# Patient Record
Sex: Female | Born: 1940 | Race: Black or African American | Hispanic: No | Smoking: Never smoker
Health system: Southern US, Community
[De-identification: ages and names within clinical notes are randomized; demographics above are authoritative.]

## PROBLEM LIST (undated history)

## (undated) DIAGNOSIS — E7800 Pure hypercholesterolemia, unspecified: Secondary | ICD-10-CM

## (undated) DIAGNOSIS — E785 Hyperlipidemia, unspecified: Secondary | ICD-10-CM

## (undated) DIAGNOSIS — I10 Essential (primary) hypertension: Secondary | ICD-10-CM

## (undated) DIAGNOSIS — E119 Type 2 diabetes mellitus without complications: Secondary | ICD-10-CM

## (undated) HISTORY — DX: Hyperlipidemia, unspecified: E78.5

## (undated) HISTORY — PX: APPENDECTOMY: SHX54

## (undated) HISTORY — PX: EYE SURGERY: SHX253

---

## 2004-04-21 ENCOUNTER — Ambulatory Visit: Payer: Self-pay | Admitting: Occupational Therapy

## 2004-07-17 ENCOUNTER — Emergency Department: Payer: Self-pay | Admitting: General Practice

## 2005-07-31 ENCOUNTER — Emergency Department: Payer: Self-pay | Admitting: General Practice

## 2005-07-31 ENCOUNTER — Other Ambulatory Visit: Payer: Self-pay

## 2005-08-10 ENCOUNTER — Ambulatory Visit: Payer: Self-pay | Admitting: Cardiovascular Disease

## 2006-01-30 ENCOUNTER — Ambulatory Visit: Payer: Self-pay

## 2006-07-19 ENCOUNTER — Emergency Department: Payer: Self-pay | Admitting: General Practice

## 2006-08-06 ENCOUNTER — Ambulatory Visit: Payer: Self-pay

## 2007-02-07 ENCOUNTER — Ambulatory Visit: Payer: Self-pay

## 2007-11-22 ENCOUNTER — Ambulatory Visit: Payer: Self-pay

## 2008-01-07 ENCOUNTER — Ambulatory Visit: Payer: Self-pay | Admitting: Surgery

## 2008-01-07 ENCOUNTER — Other Ambulatory Visit: Payer: Self-pay

## 2008-01-17 ENCOUNTER — Ambulatory Visit: Payer: Self-pay | Admitting: Surgery

## 2008-04-23 ENCOUNTER — Ambulatory Visit: Payer: Self-pay

## 2009-04-27 ENCOUNTER — Ambulatory Visit: Payer: Self-pay

## 2009-05-17 ENCOUNTER — Emergency Department: Payer: Self-pay | Admitting: Emergency Medicine

## 2009-08-12 ENCOUNTER — Emergency Department: Payer: Self-pay | Admitting: Emergency Medicine

## 2010-02-14 ENCOUNTER — Ambulatory Visit: Payer: Self-pay | Admitting: Gastroenterology

## 2010-04-28 ENCOUNTER — Ambulatory Visit: Payer: Self-pay

## 2011-05-08 ENCOUNTER — Ambulatory Visit: Payer: Self-pay

## 2011-10-21 ENCOUNTER — Inpatient Hospital Stay: Payer: Self-pay | Admitting: Internal Medicine

## 2011-10-21 LAB — CBC
HGB: 12.6 g/dL (ref 12.0–16.0)
MCHC: 33.1 g/dL (ref 32.0–36.0)
MCV: 87 fL (ref 80–100)
RBC: 4.35 10*6/uL (ref 3.80–5.20)
RDW: 13.6 % (ref 11.5–14.5)
WBC: 17.8 10*3/uL — ABNORMAL HIGH (ref 3.6–11.0)

## 2011-10-21 LAB — BASIC METABOLIC PANEL
Anion Gap: 13 (ref 7–16)
BUN: 23 mg/dL — ABNORMAL HIGH (ref 7–18)
Calcium, Total: 9.1 mg/dL (ref 8.5–10.1)
Chloride: 100 mmol/L (ref 98–107)
Co2: 24 mmol/L (ref 21–32)
Creatinine: 1.23 mg/dL (ref 0.60–1.30)
EGFR (African American): 52 — ABNORMAL LOW
Osmolality: 284 (ref 275–301)

## 2011-10-21 LAB — URINALYSIS, COMPLETE
Bilirubin,UR: NEGATIVE
Glucose,UR: NEGATIVE mg/dL (ref 0–75)
Ketone: NEGATIVE
Ph: 5 (ref 4.5–8.0)
Protein: 30
WBC UR: 14 /HPF (ref 0–5)

## 2011-10-21 LAB — DRUG SCREEN, URINE
Amphetamines, Ur Screen: NEGATIVE (ref ?–1000)
Cannabinoid 50 Ng, Ur ~~LOC~~: NEGATIVE (ref ?–50)
Cocaine Metabolite,Ur ~~LOC~~: NEGATIVE (ref ?–300)
Methadone, Ur Screen: NEGATIVE (ref ?–300)
Opiate, Ur Screen: NEGATIVE (ref ?–300)
Phencyclidine (PCP) Ur S: NEGATIVE (ref ?–25)

## 2011-10-21 LAB — PROTIME-INR: Prothrombin Time: 16.3 secs — ABNORMAL HIGH (ref 11.5–14.7)

## 2011-10-22 LAB — CBC WITH DIFFERENTIAL/PLATELET
Basophil #: 0 10*3/uL (ref 0.0–0.1)
Eosinophil #: 0 10*3/uL (ref 0.0–0.7)
Eosinophil %: 0 %
HCT: 36.3 % (ref 35.0–47.0)
HGB: 11.9 g/dL — ABNORMAL LOW (ref 12.0–16.0)
Lymphocyte #: 1.2 10*3/uL (ref 1.0–3.6)
MCH: 28.7 pg (ref 26.0–34.0)
MCV: 87 fL (ref 80–100)
Monocyte #: 1.7 x10 3/mm — ABNORMAL HIGH (ref 0.2–0.9)
Monocyte %: 12.6 %
Neutrophil %: 78.2 %
RBC: 4.15 10*6/uL (ref 3.80–5.20)
RDW: 13.8 % (ref 11.5–14.5)
WBC: 13.5 10*3/uL — ABNORMAL HIGH (ref 3.6–11.0)

## 2011-10-22 LAB — BASIC METABOLIC PANEL
Anion Gap: 8 (ref 7–16)
BUN: 22 mg/dL — ABNORMAL HIGH (ref 7–18)
Calcium, Total: 8.4 mg/dL — ABNORMAL LOW (ref 8.5–10.1)
Chloride: 101 mmol/L (ref 98–107)
Co2: 27 mmol/L (ref 21–32)
EGFR (African American): >60
EGFR (Non-African Amer.): 55 — ABNORMAL LOW
Glucose: 137 mg/dL — ABNORMAL HIGH (ref 65–99)
Sodium: 136 mmol/L (ref 136–145)

## 2011-10-23 LAB — CBC WITH DIFFERENTIAL/PLATELET
Basophil #: 0 10*3/uL (ref 0.0–0.1)
Basophil %: 0.6 %
Eosinophil #: 0.1 10*3/uL (ref 0.0–0.7)
Eosinophil %: 1.5 %
HCT: 33.9 % — ABNORMAL LOW (ref 35.0–47.0)
Lymphocyte #: 1.5 10*3/uL (ref 1.0–3.6)
MCV: 87 fL (ref 80–100)
Monocyte %: 13.6 %
Neutrophil #: 4 10*3/uL (ref 1.4–6.5)
Neutrophil %: 61.9 %
Platelet: 205 10*3/uL (ref 150–440)
RDW: 13.5 % (ref 11.5–14.5)
WBC: 6.5 10*3/uL (ref 3.6–11.0)

## 2011-10-23 LAB — BASIC METABOLIC PANEL
Anion Gap: 7 (ref 7–16)
BUN: 13 mg/dL (ref 7–18)
Calcium, Total: 8.3 mg/dL — ABNORMAL LOW (ref 8.5–10.1)
Chloride: 106 mmol/L (ref 98–107)
Co2: 27 mmol/L (ref 21–32)
Creatinine: 0.88 mg/dL (ref 0.60–1.30)
EGFR (African American): >60
EGFR (Non-African Amer.): >60
Glucose: 151 mg/dL — ABNORMAL HIGH (ref 65–99)
Osmolality: 282 (ref 275–301)
Potassium: 3.2 mmol/L — ABNORMAL LOW (ref 3.5–5.1)
Sodium: 140 mmol/L (ref 136–145)

## 2011-10-23 LAB — URINE CULTURE

## 2011-10-23 LAB — MAGNESIUM: Magnesium: 1.7 mg/dL — ABNORMAL LOW

## 2011-10-24 LAB — BASIC METABOLIC PANEL
BUN: 12 mg/dL (ref 7–18)
Calcium, Total: 8.2 mg/dL — ABNORMAL LOW (ref 8.5–10.1)
Co2: 24 mmol/L (ref 21–32)
Creatinine: 0.84 mg/dL (ref 0.60–1.30)
EGFR (African American): >60
EGFR (Non-African Amer.): >60
Glucose: 140 mg/dL — ABNORMAL HIGH (ref 65–99)
Osmolality: 278 (ref 275–301)
Sodium: 138 mmol/L (ref 136–145)

## 2011-10-24 LAB — CBC WITH DIFFERENTIAL/PLATELET
Basophil #: 0 10*3/uL (ref 0.0–0.1)
Eosinophil #: 0.2 10*3/uL (ref 0.0–0.7)
Eosinophil %: 5.2 %
HCT: 33.5 % — ABNORMAL LOW (ref 35.0–47.0)
Lymphocyte %: 29.9 %
MCHC: 33.8 g/dL (ref 32.0–36.0)
Neutrophil #: 1.8 10*3/uL (ref 1.4–6.5)
Neutrophil %: 47.9 %
Platelet: 229 10*3/uL (ref 150–440)
RDW: 13.8 % (ref 11.5–14.5)
WBC: 3.7 10*3/uL (ref 3.6–11.0)

## 2011-10-25 LAB — BASIC METABOLIC PANEL
Anion Gap: 10 (ref 7–16)
BUN: 10 mg/dL (ref 7–18)
Calcium, Total: 8.4 mg/dL — ABNORMAL LOW (ref 8.5–10.1)
Chloride: 109 mmol/L — ABNORMAL HIGH (ref 98–107)
Creatinine: 0.7 mg/dL (ref 0.60–1.30)
EGFR (African American): >60
EGFR (Non-African Amer.): >60
Glucose: 118 mg/dL — ABNORMAL HIGH (ref 65–99)
Potassium: 4.3 mmol/L (ref 3.5–5.1)
Sodium: 141 mmol/L (ref 136–145)

## 2011-10-25 LAB — CBC WITH DIFFERENTIAL/PLATELET
Basophil #: 0 10*3/uL (ref 0.0–0.1)
Basophil %: 0.8 %
Eosinophil #: 0.2 10*3/uL (ref 0.0–0.7)
HGB: 11.5 g/dL — ABNORMAL LOW (ref 12.0–16.0)
Lymphocyte #: 1.1 10*3/uL (ref 1.0–3.6)
Lymphocyte %: 28.1 %
MCH: 29.9 pg (ref 26.0–34.0)
MCV: 87 fL (ref 80–100)
Monocyte %: 14.6 %
Platelet: 245 10*3/uL (ref 150–440)
RDW: 13.8 % (ref 11.5–14.5)
WBC: 4 10*3/uL (ref 3.6–11.0)

## 2011-10-27 LAB — CULTURE, BLOOD (SINGLE)

## 2012-04-04 ENCOUNTER — Ambulatory Visit: Payer: Self-pay | Admitting: Internal Medicine

## 2012-05-15 ENCOUNTER — Ambulatory Visit: Payer: Self-pay

## 2013-06-04 ENCOUNTER — Ambulatory Visit: Payer: Self-pay

## 2013-09-03 ENCOUNTER — Ambulatory Visit: Payer: Self-pay | Admitting: Obstetrics & Gynecology

## 2014-06-17 ENCOUNTER — Ambulatory Visit: Payer: Self-pay

## 2014-10-25 NOTE — Discharge Summary (Signed)
PATIENT NAME:  Tamara [name] MR#:  [id number] DATE OF BIRTH:  08-09-1940  DATE OF ADMISSION:  10/21/2011 DATE OF DISCHARGE:  10/25/2011  DISCHARGE DIAGNOSES:  1. Pyelonephritis from Escherichia coli.  2. Hypomagnesemia.  3. Hypokalemia. 4. Dehydration. 5. Disequilibrium and altered mental status secondary to the above diagnoses.   DISCHARGE MEDICATIONS: The patient's medication regimen was adjusted while she was in the hospital. Please see discharge instructions for full details in this regard.   HOSPITAL COURSE: Tamara [name] is a 74 year old African American female with a history of diabetes, hypertension, and hyperlipidemia who presented to [facility] Emergency Room on the date of admission with a chief complaint of weakness and feelings of disequilibrium. Please see history and physical examination from date of admission for full details regarding her presentation, triage, evaluation, and treatment. The patient was found to have pyelonephritis. Blood cultures and urine cultures were sent in the standard fashion, and the patient was started empirically on IV ciprofloxacin by the admitting hospitalist. Urine culture did grow out Escherichia coli sensitive to ampicillin, and the patient was therefore transitioned to amoxicillin therapy by mouth, which she tolerated well. Her white blood cell count at its nadir was in the 17,000s, and her T-max was 101 degrees Fahrenheit. Upon institution of antimicrobial therapy, her white blood cell count did trend downward and did rapidly return to normal. Likewise, after reaching her T-max of 101 degrees Fahrenheit, the patient's temperature did start to decline and she remained afebrile for the majority of her hospital stay.   During the patient's work-up, she was also found to be both hypomagnesemic and hypokalemic. The hypomagnesemia most likely contributed to her hypokalemia. Both of these electrolyte deficiencies may have been at  least in part secondary to her medications, which as aforementioned were adjusted extensively during her hospitalization. She did also receive repletion therapy in the standard fashion for both of these electrolyte deficiencies. She will be discharged on p.o. magnesium and potassium supplementation. On the day of discharge, her electrolytes were normal.   The patient was also found to be mildly dehydrated and to have a degree of orthostatic hypotension. The orthostatic hypotension was most likely compounded by her continuing to take her antihypertensive medications even while she was dehydrated. She was given gentle rehydration during her hospitalization, and her medications were adjusted extensively as previously mentioned. With these interventions, she did show rapid response and no further issues were noted with respect to these conditions.   Regarding the patient's disequilibrium and altered mental status, the above-noted issues likely all contributed to her altered mental status and disequilibrium. As these issues were corrected, she did show improvement in both of these issues. However, as part of her evaluation, she did have a PA and lateral chest x-ray and CT of the head without contrast, both of which were unremarkable. Follow-up neuroimaging studies consisting of MRI of the brain with and without contrast were also unremarkable.   On the day of discharge, the patient was deemed to be in a satisfactory condition for discharge from the hospital and the patient was eager to return home.   Overall, this patient's hospitalization remained uncomplicated and she did show rapid response to the above noted interventions. She will maintain close follow-up care with her primary care physician, Dr. Ellsworth Lennoxejan-Sie.   DIET: Low salt, ADA diet.   ACTIVITY: As tolerated.   FOLLOW-UP: Follow-up visit with Dr. Ellsworth Lennoxejan-Sie of [facility] within seven days of discharge.   DISCHARGE TIME SPENT  INCLUDING COUNSELING, COORDINATION OF PATIENT CARE AND THE LIKE: Greater than 30 minutes.   ____________________________ Burnett Harry Shaune Spittle, MSPAS, dictating on behalf of Dr. Ellsworth Lennox. mgm:ap D: 10/25/2011 16:45:48 ET T: 10/26/2011 11:44:01 ET JOB#: [id number] cc: Burnett Harry. Stacie Acres, PA, <Dictator> Mariane Duval PA ELECTRONICALLY SIGNED 10/26/2011 15:46 Charlesetta Garibaldi MD ELECTRONICALLY SIGNED 11/02/2011 12:36

## 2014-10-25 NOTE — H&P (Signed)
PATIENT NAME:  Tamara [name], Tamara [name] MR#:  [id number] DATE OF BIRTH:  21-Dec-1940  DATE OF ADMISSION:  10/21/2011  CHIEF COMPLAINT: Weakness.   HISTORY OF PRESENT ILLNESS: Ms. Couvillon is a 74 year old African American female with a history of diabetes, hypertension, and hyperlipidemia who presents with ataxia and recent fall. She states that she fell out of bed last evening around 3 p.m. after waking up incontinent of urine and fell to the floor and was unable to pull herself back up on the sheet because she kept falling. She denied leg weakness. She has had urinary incontinence for the last two days which is new. She is an extremely poor historian and cannot give me much other information except that she has felt fine, has not traveled recently, has not had any other falls, and has not endorsed any symptoms of dizziness. She actually thinks that she is walking fine but, per staff, she was ataxic with walking. She was brought to the Emergency Room after she called her daughter who lives in [place] and told that she had been unable to get up off the floor. Daughter called EMS and they brought her here. She does have a brother who lives nearby in a neighboring town but could not tell me why she did not call him. In the ED she was found to have urinary tract infection. She was also noted to have occasions of hypoxia by pulse oximetry with no visible signs of respiratory distress.   PRIMARY CARE DOCTOR: Dr. Ellsworth Lennox. Her last office visit was in March of 2013. No medication changes at that time.   PAST MEDICAL HISTORY: 1. Diabetes for 3 to 4 years. 2. Hypertension for many years.  3. No history of strokes or coronary artery disease. She is up-to-date on screening with a colonoscopy in August 2011 and a mammogram in November 2012 both of which were normal. She has had a normal stress test in the past 10 years which was done as a screening test.   MEDICATIONS:  1. Vitamin D. 2. Zocor 10 mg daily.   3. Metformin 850 mg twice daily.  4. Exforge 10/320 daily.  5. Atenolol/chlorthalidone, dose unknown. 6. Aspirin 325 mg daily.   PAST SURGICAL HISTORY: She has no past surgical history.  ALLERGIES: She has no known drug allergies.   HOSPITALIZATIONS: She has not been hospitalized since the birth of her daughter over 40 years ago.   FAMILY HISTORY: Mother died in 09-Nov-2001 after a motor vehicle accident. She had a history of diabetes. Father died at age 32 with an acute MI. He was a tobacco user and had no history of diabetes.   SOCIAL HISTORY: She is a nonsmoker, nondrinker, lifelong. 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 Her work is not strenuous. She does not exercise regularly.   REVIEW OF SYSTEMS: Negative for fever, fatigue, weakness, weight loss and pain. She denies blurred or double vision. She is up-to-date on annual eye exams. She denies difficulty swallowing and seasonal rhinitis. No hearing loss or ear pain. She denies cough, wheeze, hemoptysis, dyspnea, and painful respirations. She has no history of chest pain, orthopnea, edema, or dyspnea on exertion. No history of palpitations. She denies any prior history of falls or syncope. She denies nausea, vomiting, diarrhea, abdominal pain, or changes in bowel habits. She denies breast masses and has occasional menopausal symptoms but not on a daily basis. She denies polyuria, nocturia, or thyroid problems. She  has had urinary incontinence for the last two days per history of present illness. No history of anemia, easy bruising or bleeding. No chronic neck, back, shoulder, knee or hip pain. No history of rashes, lesions, or changes in any skin lesions. She denies numbness, weakness, dysarthria, and vertigo. She has no history of migraine, CVAs, or TIAs. She denies any history of seizure or memory loss. She denies anxiety, insomnia, and depression.   PHYSICAL EXAMINATION:   GENERAL: This is  a middle-aged woman lying comfortable in bed. She has a flat affect. Orthostatics done by ED physician were negative for changes.   VITAL SIGNS: In the ED, blood pressure 119/91, pulse 73 and regular, temperature was normal, respirations 22 to 32, sats were usually 95 but dropped to 86% occasionally.   HEENT: Pupils are equal, round, and reactive to light. Extraocular movements are intact. Oropharynx is benign. She has good dentition.   NECK: Supple without lymphadenopathy, JVD, thyromegaly, or carotid bruits.   LUNGS: Clear to auscultation bilaterally with good air movement and no rhonchi.   CARDIOVASCULAR: Regular rate and rhythm. No murmurs, rubs, or gallops. She has good pedal pulses and no lower extremity edema.   ABDOMEN: Soft, nontender, nondistended with good bowel sounds and no evidence of hepatosplenomegaly.  GYN: Deferred.   MUSCULOSKELETAL: She has no signs of muscle atrophy. She has 3+/5 strength in the lower extremities and 4+/5 strength in the upper extremities bilaterally. Finger-to-nose was normal. She did demonstrate ataxia on exam when supported by two nurses with walking.   LYMPH: There is no cervical, axillary, inguinal, or supraclavicular lymphadenopathy.   NEUROLOGIC: Cranial nerves are intact. Deep tendon reflexes and sensation are intact. She has no signs of dysphagia or dysarthria. She is alert and oriented to person, place, and time.   LABORATORY DATA: Sodium 137, potassium 2.9, chloride 100, bicarb 24, BUN 23, creatinine 1.23, glucose 214. Accu-Chek confirms 209. White count elevated at 17.8, hemoglobin 12.6, platelets 219. Urinalysis was positive for leukocyte esterase, nitrites, and white cells.   EKG shows sinus rhythm with U waves noted.   PA and lateral chest x-ray is normal.   Head CT is pending.      ASSESSMENT AND PLAN:  1. Ataxia. Given the patient's recent episode of fall with persistent ataxia, we will admit to rule out stroke. Given her flat  affect, I am also worried about something like a thalamic stroke so if the CT is negative for stroke, will continue process rule out with MRI.  2. Urinary tract infection. This may be the source of her current altered mental status. Blood cultures have been ordered and Cipro has been prescribed IV.  3. Hypokalemia, etiology likely secondary to chlorthalidone. Check a magnesium level and replace with oral meds.  4. History of diabetes. She takes metformin. This will be held for now and the patient will receive sliding scale. Hemoglobin A1c was probably done in March so will not repeat at this time.  5. Hypertension. The patient takes Exforge and atenolol/chlorthalidone. Will hold atenolol/chlorthalidone for now given her hypokalemia.  6. Hypoxia. I'm not sure this is not artifact as she was noted to be hypoxic several times during my evaluation and it was due to the pulse oximetry on her finger being off. She has a clear x-ray and no history of sleep apnea and appears to be quite comfortable. If continues with stabilization of the pulse oximetry meter, will check an ABG.   ESTIMATED TIME OF CARE: 60 minutes.  ____________________________ Duncan Dull, MD tt:drc D: 10/21/2011 16:30:07 ET T: 10/22/2011 07:57:10 ET JOB#: [id number]  cc: Duncan Dull, MD, <Dictator> Silas Flood. Ellsworth Lennox, MD Duncan Dull MD ELECTRONICALLY SIGNED 11/09/2011 18:47

## 2015-05-17 ENCOUNTER — Emergency Department
Admission: EM | Admit: 2015-05-17 | Discharge: 2015-05-17 | Disposition: A | Discharge status: Home / Self Care | Payer: Medicare PPO | Attending: Emergency Medicine | Admitting: Emergency Medicine

## 2015-05-17 ENCOUNTER — Encounter: Payer: Self-pay | Admitting: Emergency Medicine

## 2015-05-17 DIAGNOSIS — M62838 Other muscle spasm: Secondary | ICD-10-CM | POA: Insufficient documentation

## 2015-05-17 DIAGNOSIS — Z7982 Long term (current) use of aspirin: Secondary | ICD-10-CM | POA: Diagnosis not present

## 2015-05-17 DIAGNOSIS — Z79899 Other long term (current) drug therapy: Secondary | ICD-10-CM | POA: Diagnosis not present

## 2015-05-17 DIAGNOSIS — E119 Type 2 diabetes mellitus without complications: Secondary | ICD-10-CM | POA: Diagnosis not present

## 2015-05-17 DIAGNOSIS — I10 Essential (primary) hypertension: Secondary | ICD-10-CM | POA: Diagnosis not present

## 2015-05-17 DIAGNOSIS — M542 Cervicalgia: Secondary | ICD-10-CM | POA: Diagnosis present

## 2015-05-17 HISTORY — DX: Pure hypercholesterolemia, unspecified: E78.00

## 2015-05-17 HISTORY — DX: Type 2 diabetes mellitus without complications: E11.9

## 2015-05-17 HISTORY — DX: Essential (primary) hypertension: I10

## 2015-05-17 MED ORDER — CYCLOBENZAPRINE HCL 10 MG PO TABS: 10.0000 mg | ORAL_TABLET | Freq: Three times a day (TID) | ORAL | Status: DC | PRN

## 2015-05-17 MED ORDER — NAPROXEN 500 MG PO TABS: 500.0000 mg | ORAL_TABLET | Freq: Two times a day (BID) | ORAL | Status: DC

## 2015-05-17 NOTE — ED Notes (Signed)
Patient states she woke up with stiffness on left side of neck on Sat.  No improvement since that time.

## 2015-05-17 NOTE — ED Provider Notes (Signed)
[facility] Emergency Department Provider Note  ____________________________________________  Time seen: Approximately 8:10 AM  I have reviewed the triage vital signs and the nursing notes.   HISTORY  Chief Complaint Torticollis    HPI Tamara [name] is a 74 y.o. female who presents to the emergency department complaining of left-sided neck pain. She states that she woke up on Friday morning which is 3 days prior to arrival complaining of neck pain/stiffness. She states that she initially believes she "slept on it wrong" he states that the symptoms have persisted over the last several days. She denies any headache, blurred vision, numbness or tingling, or chest pain. The symptoms are best described as "stiff". They have been constant. An over-the-counter medications have not alleviated symptoms.   Past Medical History  Diagnosis Date  . Hypertension   . Diabetes mellitus without complication (HCC)   . Hypercholesteremia     There are no active problems to display for this patient.   Past Surgical History  Procedure Laterality Date  . Eye surgery      Current Outpatient Rx  Name  Route  Sig  Dispense  Refill  . amLODipine (NORVASC) 5 MG tablet   Oral   Take by mouth daily. Dosage Unknown         . aspirin 81 MG tablet   Oral   Take 81 mg by mouth daily.         Marland Kitchen atenolol (TENORMIN) 100 MG tablet   Oral   Take 100 mg by mouth daily.         . metFORMIN (GLUCOPHAGE) 500 MG tablet   Oral   Take 500 mg by mouth 2 (two) times daily with a meal.         . Probiotic Product (ALIGN PO)   Oral   Take by mouth.         . simvastatin (ZOCOR) 5 MG tablet   Oral   Take 5 mg by mouth at bedtime.         Marland Kitchen trimethoprim (TRIMPEX) 100 MG tablet   Oral   Take 100 mg by mouth daily.         . Vitamin D, Ergocalciferol, (DRISDOL) 50000 UNITS CAPS capsule   Oral   Take by mouth. Dosage unknown         . cyclobenzaprine  (FLEXERIL) 10 MG tablet   Oral   Take 1 tablet (10 mg total) by mouth 3 (three) times daily as needed for muscle spasms.   15 tablet   0   . naproxen (NAPROSYN) 500 MG tablet   Oral   Take 1 tablet (500 mg total) by mouth 2 (two) times daily with a meal.   60 tablet   0     Allergies Review of patient's allergies indicates no known allergies.  No family history on file.  Social History Social History  Substance Use Topics  . Smoking status: Never Smoker   . Smokeless tobacco: None  . Alcohol Use: No    Review of Systems Constitutional: No fever/chills Eyes: No visual changes. ENT: No sore throat. Cardiovascular: Denies chest pain. Respiratory: Denies shortness of breath. Gastrointestinal: No abdominal pain.  No nausea, no vomiting.  No diarrhea.  No constipation. Genitourinary: Negative for dysuria. Musculoskeletal: Negative for back pain. Endorses left-sided neck pain. Skin: Negative for rash. Neurological: Negative for headaches, focal weakness or numbness.  10-point ROS otherwise negative.  ____________________________________________   PHYSICAL EXAM:  VITAL SIGNS:  ED Triage Vitals  Enc Vitals Group     BP 05/17/15 0743 150/71 mmHg     Pulse Rate 05/17/15 0743 56     Resp 05/17/15 0743 18     Temp 05/17/15 0743 98 F (36.7 C)     Temp Source 05/17/15 0743 Oral     SpO2 05/17/15 0743 100 %     Weight 05/17/15 0743 150 lb (68.04 kg)     Height 05/17/15 0743 5\' 4"  (1.626 m)     Head Cir --      Peak Flow --      Pain Score --      Pain Loc --      Pain Edu? --      Excl. in GC? --     Constitutional: Alert and oriented. Well appearing and in no acute distress. Eyes: Conjunctivae are normal. PERRL. EOMI. Head: Atraumatic. Nose: No congestion/rhinnorhea. Mouth/Throat: Mucous membranes are moist.  Oropharynx non-erythematous. Neck: No stridor.  No cervical spine tenderness to palpation. No auscultated bruits. Cardiovascular: Normal rate, regular  rhythm. Grossly normal heart sounds.  Good peripheral circulation. Respiratory: Normal respiratory effort.  No retractions. Lungs CTAB. Gastrointestinal: Soft and nontender. No distention. No abdominal bruits. No CVA tenderness. Musculoskeletal: No lower extremity tenderness nor edema.  No joint effusions. No visible abnormality to neck upon inspection. Diffuse tenderness to palpation over the paraspinal muscle group on left. Spasms are palpated. No other abnormalities palpated. Full range of motion to neck. Pulses and sensation intact distally. Neurologic:  Normal speech and language. No gross focal neurologic deficits are appreciated. No gait instability. Cranial nerves II through XII grossly intact. Skin:  Skin is warm, dry and intact. No rash noted. Psychiatric: Mood and affect are normal. Speech and behavior are normal.  ____________________________________________   LABS (all labs ordered are listed, but only abnormal results are displayed)  Labs Reviewed - No data to display ____________________________________________  EKG  Twelve-lead EKG. Sinus bradycardia. No elevations or depressions noted. PR and QRS interval within normal limits. No Q waves or delta waves present. ____________________________________________  RADIOLOGY   ____________________________________________   PROCEDURES  Procedure(s) performed: None  Critical Care performed: No  ____________________________________________   INITIAL IMPRESSION / ASSESSMENT AND PLAN / ED COURSE  Pertinent labs & imaging results that were available during my care of the patient were reviewed by me and considered in my medical decision making (see chart for details).  The patient's history, symptoms, physical exam are consistent with the finding of muscular spasms in the cervical paraspinal muscle group. I advised the patient findings and diagnosis and she verbalizes understanding of same. The patient will be placed on  muscle relaxers and anti-inflammatories for symptomatically control. She verbalizes understanding of same. Patient verbalizes compliance with treatment plan. ____________________________________________   FINAL CLINICAL IMPRESSION(S) / ED DIAGNOSES  Final diagnoses:  Cervical paraspinal muscle spasm      Racheal PatchesJonathan D Cuthriell, PA-C 05/17/15 0824  Delorise RoyalsJonathan D Cuthriell, PA-C 05/17/15 [id number]  Governor Rooksebecca Lord, MD 05/17/15 1040

## 2015-05-17 NOTE — ED Notes (Signed)
States she developed pain to neck on sat. Denies any injury. thinks may have slept wrong. No fever or trauma. Min relief with OTC ibuprofen

## 2015-05-17 NOTE — Discharge Instructions (Signed)
Heat Therapy °Heat therapy can help ease sore, stiff, injured, and tight muscles and joints. Heat relaxes your muscles, which may help ease your pain.  °RISKS AND COMPLICATIONS °If you have any of the following conditions, do not use heat therapy unless your health care provider has approved: °· Poor circulation. °· Healing wounds or scarred skin in the area being treated. °· Diabetes, heart disease, or high blood pressure. °· Not being able to feel (numbness) the area being treated. °· Unusual swelling of the area being treated. °· Active infections. °· Blood clots. °· Cancer. °· Inability to communicate pain. This may include young children and people who have problems with their brain function (dementia). °· Pregnancy. °Heat therapy should only be used on old, pre-existing, or long-lasting (chronic) injuries. Do not use heat therapy on new injuries unless directed by your health care provider. °HOW TO USE HEAT THERAPY °There are several different kinds of heat therapy, including: °· Moist heat pack. °· Warm water bath. °· Hot water bottle. °· Electric heating pad. °· Heated gel pack. °· Heated wrap. °· Electric heating pad. °Use the heat therapy method suggested by your health care provider. Follow your health care provider's instructions on when and how to use heat therapy. °GENERAL HEAT THERAPY RECOMMENDATIONS °· Do not sleep while using heat therapy. Only use heat therapy while you are awake. °· Your skin may turn pink while using heat therapy. Do not use heat therapy if your skin turns red. °· Do not use heat therapy if you have new pain. °· High heat or long exposure to heat can cause burns. Be careful when using heat therapy to avoid burning your skin. °· Do not use heat therapy on areas of your skin that are already irritated, such as with a rash or sunburn. °SEEK MEDICAL CARE IF: °· You have blisters, redness, swelling, or numbness. °· You have new pain. °· Your pain is worse. °MAKE SURE  YOU: °· Understand these instructions. °· Will watch your condition. °· Will get help right away if you are not doing well or get worse. °  °This information is not intended to replace advice given to you by your health care provider. Make sure you discuss any questions you have with your health care provider. °  °Document Released: 09/11/2011 Document Revised: 07/10/2014 Document Reviewed: 08/12/2013 °Elsevier Interactive Patient Education ©2016 [employer] ° °Muscle Cramps and Spasms °Muscle cramps and spasms occur when a muscle or muscles tighten and you have no control over this tightening (involuntary muscle contraction). They are a common problem and can develop in any muscle. The most common place is in the calf muscles of the leg. Both muscle cramps and muscle spasms are involuntary muscle contractions, but they also have differences:  °· Muscle cramps are sporadic and painful. They may last a few seconds to a quarter of an hour. Muscle cramps are often more forceful and last longer than muscle spasms. °· Muscle spasms may or may not be painful. They may also last just a few seconds or much longer. °CAUSES  °It is uncommon for cramps or spasms to be due to a serious underlying problem. In many cases, the cause of cramps or spasms is unknown. Some common causes are:  °· Overexertion.   °· Overuse from repetitive motions (doing the same thing over and over).   °· Remaining in a certain position for a long period of time.   °· Improper preparation, form, or technique while performing a sport or activity.   °·   Dehydration.   °· Injury.   °· Side effects of some medicines.   °· Abnormally low levels of the salts and ions in your blood (electrolytes), especially potassium and calcium. This could happen if you are taking water pills (diuretics) or you are pregnant.   °Some underlying medical problems can make it more likely to develop cramps or spasms. These include, but are not limited to:  °· Diabetes.    °· Parkinson disease.   °· Hormone disorders, such as thyroid problems.   °· Alcohol abuse.   °· Diseases specific to muscles, joints, and bones.   °· Blood vessel disease where not enough blood is getting to the muscles.   °HOME CARE INSTRUCTIONS  °· Stay well hydrated. Drink enough water and fluids to keep your urine clear or pale yellow. °· It may be helpful to massage, stretch, and relax the affected muscle. °· For tight or tense muscles, use a warm towel, heating pad, or hot shower water directed to the affected area. °· If you are sore or have pain after a cramp or spasm, applying ice to the affected area may relieve discomfort. °¨ Put ice in a plastic bag. °¨ Place a towel between your skin and the bag. °¨ Leave the ice on for 15-20 minutes, 03-04 times a day. °· Medicines used to treat a known cause of cramps or spasms may help reduce their frequency or severity. Only take over-the-counter or prescription medicines as directed by your caregiver. °SEEK MEDICAL CARE IF:  °Your cramps or spasms get more severe, more frequent, or do not improve over time.  °MAKE SURE YOU:  °· Understand these instructions. °· Will watch your condition. °· Will get help right away if you are not doing well or get worse. °  °This information is not intended to replace advice given to you by your health care provider. Make sure you discuss any questions you have with your health care provider. °  °Document Released: 12/09/2001 Document Revised: 10/14/2012 Document Reviewed: 06/05/2012 °Elsevier Interactive Patient Education ©2016 [employer] ° °

## 2015-05-18 ENCOUNTER — Telehealth: Payer: Self-pay | Admitting: Emergency Medicine

## 2015-05-18 NOTE — ED Notes (Signed)
Pt called and says she has swelling on face after taking prescriptions from here--naprosyn and cyclobenzaprine.  I told her to stop both meds and call her pcp.  She agrees to take only tylenol until further instructions from pcp.

## 2015-08-06 ENCOUNTER — Ambulatory Visit (INDEPENDENT_AMBULATORY_CARE_PROVIDER_SITE_OTHER): Payer: 59 | Admitting: Sports Medicine

## 2015-08-06 ENCOUNTER — Encounter: Payer: Self-pay | Admitting: Sports Medicine

## 2015-08-06 DIAGNOSIS — M79673 Pain in unspecified foot: Secondary | ICD-10-CM

## 2015-08-06 DIAGNOSIS — E119 Type 2 diabetes mellitus without complications: Secondary | ICD-10-CM

## 2015-08-06 DIAGNOSIS — M79672 Pain in left foot: Secondary | ICD-10-CM

## 2015-08-06 DIAGNOSIS — M79671 Pain in right foot: Secondary | ICD-10-CM

## 2015-08-06 DIAGNOSIS — B351 Tinea unguium: Secondary | ICD-10-CM

## 2015-08-06 NOTE — Patient Instructions (Addendum)
Vinegar Soaks twice weekly 1cup of white distill vinegar to 8 cups of warm water in basin; soak for 20 mins  Diabetes and Foot Care Diabetes may cause you to have problems because of poor blood supply (circulation) to your feet and legs. This may cause the skin on your feet to become thinner, break easier, and heal more slowly. Your skin may become dry, and the skin may peel and crack. You may also have nerve damage in your legs and feet causing decreased feeling in them. You may not notice minor injuries to your feet that could lead to infections or more serious problems. Taking care of your feet is one of the most important things you can do for yourself.  HOME CARE INSTRUCTIONS  Wear shoes at all times, even in the house. Do not go barefoot. Bare feet are easily injured.  Check your feet daily for blisters, cuts, and redness. If you cannot see the bottom of your feet, use a mirror or ask someone for help.  Wash your feet with warm water (do not use hot water) and mild soap. Then pat your feet and the areas between your toes until they are completely dry. Do not soak your feet as this can dry your skin.  Apply a moisturizing lotion or petroleum jelly (that does not contain alcohol and is unscented) to the skin on your feet and to dry, brittle toenails. Do not apply lotion between your toes.  Trim your toenails straight across. Do not dig under them or around the cuticle. File the edges of your nails with an emery board or nail file.  Do not cut corns or calluses or try to remove them with medicine.  Wear clean socks or stockings every day. Make sure they are not too tight. Do not wear knee-high stockings since they may decrease blood flow to your legs.  Wear shoes that fit properly and have enough cushioning. To break in new shoes, wear them for just a few hours a day. This prevents you from injuring your feet. Always look in your shoes before you put them on to be sure there are no objects  inside.  Do not cross your legs. This may decrease the blood flow to your feet.  If you find a minor scrape, cut, or break in the skin on your feet, keep it and the skin around it clean and dry. These areas may be cleansed with mild soap and water. Do not cleanse the area with peroxide, alcohol, or iodine.  When you remove an adhesive bandage, be sure not to damage the skin around it.  If you have a wound, look at it several times a day to make sure it is healing.  Do not use heating pads or hot water bottles. They may burn your skin. If you have lost feeling in your feet or legs, you may not know it is happening until it is too late.  Make sure your health care provider performs a complete foot exam at least annually or more often if you have foot problems. Report any cuts, sores, or bruises to your health care provider immediately. SEEK MEDICAL CARE IF:   You have an injury that is not healing.  You have cuts or breaks in the skin.  You have an ingrown nail.  You notice redness on your legs or feet.  You feel burning or tingling in your legs or feet.  You have pain or cramps in your legs and feet.  Your  legs or feet are numb.  Your feet always feel cold. SEEK IMMEDIATE MEDICAL CARE IF:   There is increasing redness, swelling, or pain in or around a wound.  There is a red line that goes up your leg.  Pus is coming from a wound.  You develop a fever or as directed by your health care provider.  You notice a bad smell coming from an ulcer or wound.   This information is not intended to replace advice given to you by your health care provider. Make sure you discuss any questions you have with your health care provider.   Document Released: 06/16/2000 Document Revised: 02/19/2013 Document Reviewed: 11/26/2012 Elsevier Interactive Patient Education [employer].

## 2015-08-06 NOTE — Progress Notes (Signed)
Patient ID: Tamara [name], female   DOB: 1941/06/30, 75 y.o.   MRN: [id number] Subjective: Tamara [name] is a 75 y.o. female patient with history of type 2 diabetes who presents to office today complaining of long, painful nails  while ambulating in shoes; unable to trim. Patient is concerned with thickness and discoloration of nails and wants to discuss her treatment options. Patient states that the glucose reading this morning was not recorded.Patient denies any new changes in medication or new problems. Patient denies any new cramping, numbness, burning or tingling in the legs.  There are no active problems to display for this patient.  Current Outpatient Prescriptions on File Prior to Visit  Medication Sig Dispense Refill  . amLODipine (NORVASC) 5 MG tablet Take by mouth daily. Dosage Unknown    . aspirin 81 MG tablet Take 81 mg by mouth daily.    Marland Kitchen atenolol (TENORMIN) 100 MG tablet Take 100 mg by mouth daily.    . metFORMIN (GLUCOPHAGE) 500 MG tablet Take 500 mg by mouth 2 (two) times daily with a meal.    . Probiotic Product (ALIGN PO) Take by mouth.    . simvastatin (ZOCOR) 5 MG tablet Take 5 mg by mouth at bedtime.    Marland Kitchen trimethoprim (TRIMPEX) 100 MG tablet Take 100 mg by mouth daily.    . Vitamin D, Ergocalciferol, (DRISDOL) 50000 UNITS CAPS capsule Take by mouth. Dosage unknown     No current facility-administered medications on file prior to visit.   No Known Allergies   Objective: General: Patient is awake, alert, and oriented x 3 and in no acute distress.  Integument: Skin is warm, dry and supple bilateral. Nails are tender, long, thickened and  dystrophic with subungual debris, consistent with onychomycosis, 1-5 bilateral with the bilateral hallux and second toenails, most extremely involved. No signs of infection. No open lesions or preulcerative lesions present bilateral. Remaining integument unremarkable.  Vasculature:  Dorsalis Pedis pulse 1/4 bilateral. Posterior  Tibial pulse  1/4 bilateral.  Capillary fill time <3 sec 1-5 bilateral.Scant hair growth to the level of the digits. Temperature gradient within normal limits. No varicosities present bilateral. No edema present bilateral.   Neurology: The patient has intact sensation measured with a 5.07/10g Semmes Weinstein Monofilament at all pedal sites bilateral . Vibratory sensation intact bilateral with tuning fork. No Babinski sign present bilateral.   Musculoskeletal: No gross pedal deformities noted bilateral. Muscular strength 5/5 in all lower extremity muscular groups bilateral without pain or limitation on range of motion . No tenderness with calf compression bilateral.  Assessment and Plan: Problem List Items Addressed This Visit    None    Visit Diagnoses    Dermatophytosis of nail    -  Primary    Relevant Medications    ciclopirox (PENLAC) 8 % solution    Foot pain, bilateral        Diabetes mellitus without complication (HCC)        Relevant Medications    amLODipine-valsartan (EXFORGE) 10-320 MG tablet    amLODipine-valsartan (EXFORGE) 5-160 MG tablet    atenolol-chlorthalidone (TENORETIC) 100-25 MG tablet    simvastatin (ZOCOR) 10 MG tablet       -Examined patient. -Discussed and educated patient on diabetic foot care, especially with  regards to the vascular, neurological and musculoskeletal systems.  -Stressed the importance of good glycemic control and the detriment of not  controlling glucose levels in relation to the foot. -Treatment options for nail fungus were also  discussed with patient. Risks, benefits, alternatives explained; Patient opted for manual debridement at this time. -Mechanically debrided all nails 1-5 bilateral using sterile nail nipper and filed with dremel without incident  -Answered all patient questions -Recommended good supportive shoes daily -Patient to return in 3 months or call the office if any problems or questions arise in the   Meantime.  Asencion Islam, DPM

## 2015-08-13 ENCOUNTER — Ambulatory Visit: Payer: 59 | Admitting: Sports Medicine

## 2015-09-29 ENCOUNTER — Telehealth: Payer: Self-pay | Admitting: *Deleted

## 2015-09-29 NOTE — Telephone Encounter (Signed)
Patient called wanting to know why her toes were tingling at night.

## 2015-09-30 NOTE — Telephone Encounter (Signed)
Informed pt the tingling in the toes could be the 1st signs of diabetic peripheral neuropathy or even circulation problems, is offered pt an appt to discuss with Dr. Marylene LandStover.  Pt agreed and I transferred to schedulers.

## 2015-10-05 ENCOUNTER — Ambulatory Visit (INDEPENDENT_AMBULATORY_CARE_PROVIDER_SITE_OTHER): Payer: Medicare Other | Admitting: Sports Medicine

## 2015-10-05 ENCOUNTER — Encounter: Payer: Self-pay | Admitting: Sports Medicine

## 2015-10-05 DIAGNOSIS — M79673 Pain in unspecified foot: Secondary | ICD-10-CM | POA: Diagnosis not present

## 2015-10-05 DIAGNOSIS — E1141 Type 2 diabetes mellitus with diabetic mononeuropathy: Secondary | ICD-10-CM | POA: Diagnosis not present

## 2015-10-05 NOTE — Progress Notes (Signed)
Patient ID: Tamara [name], female   DOB: 1941/02/16, 75 y.o.   MRN: [id number]  Subjective: Tamara [name] is a 75 y.o. female patient with history of type 2 diabetes who presents to office today complaining of occassional tingling in right>left toes that has started over 2 weeks ago. States that blood sugars are around 120 and sometimes the tingling wakes her up at night; relieved with stretching the toes. No other pedal complaints.  There are no active problems to display for this patient.  Current Outpatient Prescriptions on File Prior to Visit  Medication Sig Dispense Refill  . amLODipine (NORVASC) 5 MG tablet Take by mouth daily. Dosage Unknown    . amLODipine-valsartan (EXFORGE) 10-320 MG tablet     . amLODipine-valsartan (EXFORGE) 5-160 MG tablet Take by mouth.    Marland Kitchen. aspirin 81 MG tablet Take 81 mg by mouth daily.    Marland Kitchen. atenolol (TENORMIN) 100 MG tablet Take 100 mg by mouth daily.    Marland Kitchen. atenolol-chlorthalidone (TENORETIC) 100-25 MG tablet     . Cholecalciferol (VITAMIN D-1000 MAX ST) 1000 units tablet Take by mouth.    . ciclopirox (PENLAC) 8 % solution Apply topically.    . metFORMIN (GLUCOPHAGE) 500 MG tablet Take 500 mg by mouth 2 (two) times daily with a meal.    . Probiotic Product (ALIGN PO) Take by mouth.    . simvastatin (ZOCOR) 10 MG tablet     . simvastatin (ZOCOR) 5 MG tablet Take 5 mg by mouth at bedtime.    Marland Kitchen. trimethoprim (TRIMPEX) 100 MG tablet Take 100 mg by mouth daily.    . Vitamin D, Ergocalciferol, (DRISDOL) 50000 UNITS CAPS capsule Take by mouth. Dosage unknown     No current facility-administered medications on file prior to visit.   No Known Allergies   Objective: General: Patient is awake, alert, and oriented x 3 and in no acute distress.  Integument: Skin is warm, dry and supple bilateral. Nails are short and mycotic. No signs of infection. No open lesions or preulcerative lesions present bilateral. Remaining integument unremarkable.  Vasculature:   Dorsalis Pedis pulse 1/4 bilateral. Posterior Tibial pulse  1/4 bilateral.  Capillary fill time <3 sec 1-5 bilateral.Scant hair growth to the level of the digits. Temperature gradient within normal limits. No varicosities present bilateral. No edema present bilateral.   Neurology: The patient has intact sensation measured with a 5.07/10g Semmes Weinstein Monofilament at all pedal sites bilateral . Vibratory sensation intact bilateral with tuning fork. No Babinski sign present bilateral. Subjective occasional tingling right>left toes.  Musculoskeletal: No gross pedal deformities noted bilateral. Muscular strength 5/5 in all lower extremity muscular groups bilateral without pain or limitation on range of motion . No tenderness with calf compression bilateral.  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 -Discussed and educated patient on diabetic foot care, especially with  regards to the vascular, neurological and musculoskeletal systems.  -Stressed the importance of good glycemic control and the detriment of not  controlling glucose levels in relation to the foot. -Will closely neuritis likely early onset neuropathy if becomes more bothersome will start patient on Gabapentin QHS -Recommended good supportive shoes daily -Patient to return as scheduled for routine care or call the office if  any problems or questions arise in the  Meantime.  Landis Martins, DPM

## 2015-11-05 ENCOUNTER — Ambulatory Visit: Payer: 59 | Admitting: Sports Medicine

## 2015-12-07 ENCOUNTER — Ambulatory Visit: Payer: Medicare Other | Admitting: Sports Medicine

## 2015-12-24 ENCOUNTER — Encounter: Payer: Self-pay | Admitting: Sports Medicine

## 2015-12-24 ENCOUNTER — Ambulatory Visit (INDEPENDENT_AMBULATORY_CARE_PROVIDER_SITE_OTHER): Payer: Medicare Other | Admitting: Sports Medicine

## 2015-12-24 DIAGNOSIS — M79673 Pain in unspecified foot: Secondary | ICD-10-CM

## 2015-12-24 DIAGNOSIS — B351 Tinea unguium: Secondary | ICD-10-CM

## 2015-12-24 DIAGNOSIS — E1141 Type 2 diabetes mellitus with diabetic mononeuropathy: Secondary | ICD-10-CM | POA: Diagnosis not present

## 2015-12-24 NOTE — Progress Notes (Signed)
Patient ID: Tamara [name], female   DOB: 02/08/1941, 75 y.o.   MRN: [id number]  Subjective: Tamara [name] is a 75 y.o. female patient with history of type 2 diabetes who presents to office today complaining of for nail care. Denies any worsening tingling. States that blood sugars were not recorded. No other pedal complaints.  There are no active problems to display for this patient.  Current Outpatient Prescriptions on File Prior to Visit  Medication Sig Dispense Refill  . amLODipine (NORVASC) 5 MG tablet Take by mouth daily. Dosage Unknown    . amLODipine-valsartan (EXFORGE) 10-320 MG tablet     . amLODipine-valsartan (EXFORGE) 5-160 MG tablet Take by mouth.    Marland Kitchen. aspirin 81 MG tablet Take 81 mg by mouth daily.    Marland Kitchen. atenolol (TENORMIN) 100 MG tablet Take 100 mg by mouth daily.    Marland Kitchen. atenolol-chlorthalidone (TENORETIC) 100-25 MG tablet     . Cholecalciferol (VITAMIN D-1000 MAX ST) 1000 units tablet Take by mouth.    . ciclopirox (PENLAC) 8 % solution Apply topically.    . metFORMIN (GLUCOPHAGE) 500 MG tablet Take 500 mg by mouth 2 (two) times daily with a meal.    . Probiotic Product (ALIGN PO) Take by mouth.    . simvastatin (ZOCOR) 10 MG tablet     . simvastatin (ZOCOR) 5 MG tablet Take 5 mg by mouth at bedtime.    Marland Kitchen. trimethoprim (TRIMPEX) 100 MG tablet Take 100 mg by mouth daily.    . Vitamin D, Ergocalciferol, (DRISDOL) 50000 UNITS CAPS capsule Take by mouth. Dosage unknown     No current facility-administered medications on file prior to visit.   No Known Allergies   Objective: General: Patient is awake, alert, and oriented x 3 and in no acute distress.  Integument: Skin is warm, dry and supple bilateral. Nails are elongated and mycotic. No signs of infection. No open lesions or preulcerative lesions present bilateral. Remaining integument unremarkable.  Vasculature:  Dorsalis Pedis pulse 1/4 bilateral. Posterior Tibial pulse  1/4 bilateral.  Capillary fill time <3 sec 1-5  bilateral.Scant hair growth to the level of the digits. Temperature gradient within normal limits. No varicosities present bilateral. No edema present bilateral.   Neurology: The patient has intact sensation measured with a 5.07/10g Semmes Weinstein Monofilament at all pedal sites bilateral . Vibratory sensation intact bilateral with tuning fork. No Babinski sign present bilateral. Subjective occasional tingling right>left toes.  Musculoskeletal: No gross pedal deformities noted bilateral. Muscular strength 5/5 in all lower extremity muscular groups bilateral without pain or limitation on range of motion . No tenderness with calf compression bilateral.  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 -Discussed and educated patient on diabetic foot care, especially with  regards to the vascular, neurological and musculoskeletal systems.  -Stressed the importance of good glycemic control and the detriment of not  controlling glucose levels in relation to the foot. -Mechanically debrided nails using sterile nail nipper without incident -Will closely monitor neuritis likely early onset neuropathy if becomes more bothersome will start patient on Gabapentin QHS  -Recommended good supportive shoes daily -Patient to return as scheduled in 3 months for routine diabetic foot  care or call the office if any problems or questions arise in the meantime.  Asencion Islamitorya Jasher Barkan, DPM

## 2016-03-07 ENCOUNTER — Encounter: Payer: Self-pay | Admitting: Podiatry

## 2016-03-07 ENCOUNTER — Ambulatory Visit (INDEPENDENT_AMBULATORY_CARE_PROVIDER_SITE_OTHER): Payer: Medicare Other | Admitting: Podiatry

## 2016-03-07 ENCOUNTER — Ambulatory Visit: Payer: Medicare Other | Admitting: Sports Medicine

## 2016-03-07 DIAGNOSIS — M79671 Pain in right foot: Secondary | ICD-10-CM

## 2016-03-07 DIAGNOSIS — M79673 Pain in unspecified foot: Secondary | ICD-10-CM | POA: Diagnosis not present

## 2016-03-07 DIAGNOSIS — L84 Corns and callosities: Secondary | ICD-10-CM | POA: Diagnosis not present

## 2016-03-07 DIAGNOSIS — B351 Tinea unguium: Secondary | ICD-10-CM

## 2016-03-07 NOTE — Progress Notes (Signed)
SUBJECTIVE Patient with a history of diabetes mellitus presents to office today complaining of elongated, thickened nails. Pain while ambulating in shoes. Patient is unable to trim their own nails. Patient also complains of callus formation to the right fifth digit which is painful  No Known Allergies  OBJECTIVE General Patient is awake, alert, and oriented x 3 and in no acute distress. Derm Skin is dry and supple bilateral. Negative open lesions or macerations. Remaining integument unremarkable. Nails are tender, long, thickened and dystrophic with subungual debris, consistent with onychomycosis, 1-5 bilateral. No signs of infection noted. Hyperkeratotic lesion noted to the right fifth digit. Vasc  DP and PT pedal pulses palpable bilaterally. Temperature gradient within normal limits.  Neuro Epicritic and protective threshold sensation diminished bilaterally.  Musculoskeletal Exam No symptomatic pedal deformities noted bilateral. Muscular strength within normal limits.  ASSESSMENT 1. Diabetes Mellitus w/ peripheral neuropathy 2. 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 Pain in foot bilateral 4. Painful callus lesion right fifth digit.  PLAN OF CARE Patient evaluated today. Instructed to maintain good pedal hygiene and foot care. Stressed importance of controlling blood sugar.  Mechanical debridement of nails 1-5 bilaterally performed using a nail nipper. Filed with dremel without incident.  Sharp excisional debridement of the hyperkeratotic callus formation over the right fifth digit was performed using a chisel blade All patient questions were answered. Return to clinic in 3 mos.    Tamara ShellingBrent M [name], DPM

## 2016-06-06 ENCOUNTER — Ambulatory Visit: Payer: Medicare Other | Admitting: Podiatry

## 2016-06-12 ENCOUNTER — Other Ambulatory Visit: Payer: Self-pay | Admitting: Internal Medicine

## 2016-06-12 DIAGNOSIS — Z1231 Encounter for screening mammogram for malignant neoplasm of breast: Secondary | ICD-10-CM

## 2016-07-07 ENCOUNTER — Ambulatory Visit: Payer: Medicare Other | Admitting: Podiatry

## 2016-07-14 ENCOUNTER — Ambulatory Visit: Payer: Medicare Other | Admitting: Podiatry

## 2016-07-19 ENCOUNTER — Ambulatory Visit: Payer: Medicare PPO

## 2016-07-25 ENCOUNTER — Ambulatory Visit (INDEPENDENT_AMBULATORY_CARE_PROVIDER_SITE_OTHER): Payer: Medicare Other | Admitting: Podiatry

## 2016-07-25 DIAGNOSIS — L603 Nail dystrophy: Secondary | ICD-10-CM

## 2016-07-25 DIAGNOSIS — E0843 Diabetes mellitus due to underlying condition with diabetic autonomic (poly)neuropathy: Secondary | ICD-10-CM

## 2016-07-25 DIAGNOSIS — B351 Tinea unguium: Secondary | ICD-10-CM | POA: Diagnosis not present

## 2016-07-25 DIAGNOSIS — L608 Other nail disorders: Secondary | ICD-10-CM

## 2016-07-25 DIAGNOSIS — M79609 Pain in unspecified limb: Secondary | ICD-10-CM | POA: Diagnosis not present

## 2016-07-25 NOTE — Progress Notes (Signed)
   SUBJECTIVE Patient with a history of diabetes mellitus presents to office today complaining of elongated, thickened nails. Pain while ambulating in shoes. Patient is unable to trim their own nails.   OBJECTIVE General Patient is awake, alert, and oriented x 3 and in no acute distress. Derm Skin is dry and supple bilateral. Negative open lesions or macerations. Remaining integument unremarkable. Nails are tender, long, thickened and dystrophic with subungual debris, consistent with onychomycosis, 1-5 bilateral. No signs of infection noted. Vasc  DP and PT pedal pulses palpable bilaterally. Temperature gradient within normal limits.  Neuro Epicritic and protective threshold sensation diminished bilaterally.  Musculoskeletal Exam No symptomatic pedal deformities noted bilateral. Muscular strength within normal limits.  ASSESSMENT 1. Diabetes Mellitus w/ peripheral neuropathy 2. 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 Pain in foot bilateral  PLAN OF CARE 1. Patient evaluated today. 2. Instructed to maintain good pedal hygiene and foot care. Stressed importance of controlling blood sugar.  3. Mechanical debridement of nails 1-5 bilaterally performed using a nail nipper. Filed with dremel without incident.  4. Return to clinic in 3 mos.     Falan Hensler M. Aine Strycharz, DPM [facility]  Dr. Ainsley Deakins M. Carolene Gitto, DPM    [address]                                        [place], [place] [address]                Office [phone]  Fax [phone]       

## 2016-08-02 ENCOUNTER — Ambulatory Visit
Admission: RE | Admit: 2016-08-02 | Discharge: 2016-08-02 | Disposition: A | Discharge status: Home / Self Care | Payer: Medicare Other | Source: Ambulatory Visit | Attending: Internal Medicine | Admitting: Internal Medicine

## 2016-08-02 DIAGNOSIS — Z1231 Encounter for screening mammogram for malignant neoplasm of breast: Secondary | ICD-10-CM | POA: Diagnosis present

## 2016-09-20 ENCOUNTER — Encounter: Payer: Self-pay | Admitting: Obstetrics and Gynecology

## 2016-09-20 ENCOUNTER — Ambulatory Visit (INDEPENDENT_AMBULATORY_CARE_PROVIDER_SITE_OTHER): Payer: Medicare Other | Admitting: Obstetrics and Gynecology

## 2016-09-20 VITALS — BP 120/70 | HR 55 | Ht 65.0 in | Wt 152.0 lb

## 2016-09-20 DIAGNOSIS — Z01419 Encounter for gynecological examination (general) (routine) without abnormal findings: Secondary | ICD-10-CM

## 2016-09-20 DIAGNOSIS — Z124 Encounter for screening for malignant neoplasm of cervix: Secondary | ICD-10-CM | POA: Diagnosis not present

## 2016-09-20 DIAGNOSIS — Z1239 Encounter for other screening for malignant neoplasm of breast: Secondary | ICD-10-CM

## 2016-09-20 NOTE — Progress Notes (Signed)
HPI:      [name] is a 76 y.o. G2P0 who LMP was No LMP recorded. Patient is postmenopausal., presents today for her annual examination. Her menses are Absent. She does not have intermenstrual bleeding.  She does not have vasomotor sx.  Sex activity: not sexually active. She does not have vaginal dryness.  Last Pap: August 13, 2014  Results were: no abnormalities   Hx of STDs: none  Last mammogram: August 02, 2016  Results were: normal--routine follow-up in 12 months There is no FH of breast cancer. There is no FH of ovarian cancer. The patient does do self-breast exams.  Colonoscopy: colonoscopy 7 years ago without abnormalities.   Tobacco use: The patient denies current or previous tobacco use. Alcohol use: none Exercise: not active  She does get adequate calcium and Vitamin D in her diet. She is followed by her PCP.  Past Medical History:  Diagnosis Date  . Diabetes mellitus without complication (HCC)   . Hypercholesteremia   . Hypertension     Past Surgical History:  Procedure Laterality Date  . EYE SURGERY      Family History  Problem Relation Age of Onset  . Diabetes type II Brother   . Hypertension Brother      ROS:  Review of Systems  Constitutional: Negative for fever, malaise/fatigue and weight loss.  HENT: Negative for congestion, ear pain and sinus pain.   Respiratory: Negative for cough, shortness of breath and wheezing.   Cardiovascular: Negative for chest pain, orthopnea and leg swelling.  Gastrointestinal: Negative for constipation, diarrhea, nausea and vomiting.  Genitourinary: Negative for dysuria, frequency, hematuria and urgency.       Breast ROS: negative   Musculoskeletal: Negative for back pain, joint pain and myalgias.  Skin: Negative for itching and rash.  Neurological: Negative for dizziness, tingling, focal weakness and headaches.  Endo/Heme/Allergies: Negative for environmental allergies. Does not bruise/bleed  easily.  Psychiatric/Behavioral: Negative for depression and suicidal ideas. The patient is not nervous/anxious and does not have insomnia.     Objective: BP 120/70   Pulse (!) 55   Ht 5\' 5"  (1.651 m)   Wt 152 lb (68.9 kg)   BMI 25.29 kg/m    Physical Exam  Constitutional: She is oriented to person, place, and time. She appears well-developed and well-nourished.  Genitourinary: Vagina normal and uterus normal. No erythema or tenderness in the vagina. No vaginal discharge found. Right adnexum does not display mass and does not display tenderness. Left adnexum does not display mass and does not display tenderness. Cervix does not exhibit motion tenderness or polyp. Uterus is not enlarged or tender.  Neck: Normal range of motion. No thyromegaly present.  Cardiovascular: Normal rate, regular rhythm and normal heart sounds.   No murmur heard. Pulmonary/Chest: Effort normal and breath sounds normal. Right breast exhibits no mass, no nipple discharge, no skin change and no tenderness. Left breast exhibits no mass, no nipple discharge, no skin change and no tenderness.  Abdominal: Soft. There is no tenderness. There is no guarding.  Musculoskeletal: Normal range of motion.  Neurological: She is alert and oriented to person, place, and time. No cranial nerve deficit.  Psychiatric: She has a normal mood and affect. Her behavior is normal.  Vitals reviewed.   Assessment/Plan: Encounter for annual routine gynecological examination - Given pt's age, we no longer need to do Medicare pelvic/breast exams in 2 yrs, but offered this as an option to pt if pt  desires.  Screening for breast cancer            GYN counsel mammography screening, menopause, adequate intake of calcium and vitamin D     F/U  Return if symptoms worsen or fail to improve.  Avaleen Brownley B. Tyesha Joffe, PA-C 09/20/2016 2:30 PM

## 2016-10-24 ENCOUNTER — Ambulatory Visit: Payer: Medicare Other | Admitting: Podiatry

## 2016-11-10 ENCOUNTER — Ambulatory Visit (INDEPENDENT_AMBULATORY_CARE_PROVIDER_SITE_OTHER): Payer: Medicare Other | Admitting: Podiatry

## 2016-11-10 ENCOUNTER — Encounter: Payer: Self-pay | Admitting: Podiatry

## 2016-11-10 DIAGNOSIS — B351 Tinea unguium: Secondary | ICD-10-CM | POA: Diagnosis not present

## 2016-11-10 DIAGNOSIS — M79676 Pain in unspecified toe(s): Secondary | ICD-10-CM | POA: Diagnosis not present

## 2016-11-12 NOTE — Progress Notes (Signed)
   SUBJECTIVE Patient with a history of diabetes mellitus presents to office today complaining of elongated, thickened nails. Pain while ambulating in shoes. Patient is unable to trim their own nails.   OBJECTIVE General Patient is awake, alert, and oriented x 3 and in no acute distress. Derm Skin is dry and supple bilateral. Negative open lesions or macerations. Remaining integument unremarkable. Nails are tender, long, thickened and dystrophic with subungual debris, consistent with onychomycosis, 1-5 bilateral. No signs of infection noted. Vasc  DP and PT pedal pulses palpable bilaterally. Temperature gradient within normal limits.  Neuro Epicritic and protective threshold sensation diminished bilaterally.  Musculoskeletal Exam No symptomatic pedal deformities noted bilateral. Muscular strength within normal limits.  ASSESSMENT 1. Diabetes Mellitus w/ peripheral neuropathy 2. 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 Pain in foot bilateral  PLAN OF CARE 1. Patient evaluated today. 2. Instructed to maintain good pedal hygiene and foot care. Stressed importance of controlling blood sugar.  3. Mechanical debridement of nails 1-5 bilaterally performed using a nail nipper. Filed with dremel without incident.  4. Return to clinic in 3 mos.     Brent M. Evans, DPM [facility]  Dr. Brent M. Evans, DPM    [address]                                        [place], [place] [address]                Office [phone]  Fax [phone]       

## 2016-12-28 ENCOUNTER — Telehealth: Payer: Self-pay | Admitting: Urology

## 2016-12-28 NOTE — Telephone Encounter (Signed)
Per office protocol patient has not been seen in our office. [facility] will need to call [facility] for refill.

## 2016-12-28 NOTE — Telephone Encounter (Signed)
Pt has a new patient appt here on 7/30.  [place]  She needs a refill on RX.  Does she need to contact [facility] since she hasn't been seen here yet.  Please advise.

## 2017-01-09 ENCOUNTER — Inpatient Hospital Stay
Admission: EM | Admit: 2017-01-09 | Discharge: 2017-01-12 | DRG: 689 | Disposition: A | Discharge status: Home / Self Care | Payer: Medicare Other | Attending: Internal Medicine | Admitting: Internal Medicine

## 2017-01-09 ENCOUNTER — Emergency Department: Payer: Medicare Other

## 2017-01-09 ENCOUNTER — Encounter: Payer: Self-pay | Admitting: Emergency Medicine

## 2017-01-09 DIAGNOSIS — G9341 Metabolic encephalopathy: Secondary | ICD-10-CM | POA: Diagnosis present

## 2017-01-09 DIAGNOSIS — E860 Dehydration: Secondary | ICD-10-CM | POA: Diagnosis present

## 2017-01-09 DIAGNOSIS — E119 Type 2 diabetes mellitus without complications: Secondary | ICD-10-CM | POA: Diagnosis present

## 2017-01-09 DIAGNOSIS — Z7984 Long term (current) use of oral hypoglycemic drugs: Secondary | ICD-10-CM

## 2017-01-09 DIAGNOSIS — N179 Acute kidney failure, unspecified: Secondary | ICD-10-CM | POA: Diagnosis present

## 2017-01-09 DIAGNOSIS — Z79899 Other long term (current) drug therapy: Secondary | ICD-10-CM

## 2017-01-09 DIAGNOSIS — E876 Hypokalemia: Secondary | ICD-10-CM | POA: Diagnosis present

## 2017-01-09 DIAGNOSIS — Z7982 Long term (current) use of aspirin: Secondary | ICD-10-CM | POA: Diagnosis not present

## 2017-01-09 DIAGNOSIS — I10 Essential (primary) hypertension: Secondary | ICD-10-CM | POA: Diagnosis present

## 2017-01-09 DIAGNOSIS — N390 Urinary tract infection, site not specified: Principal | ICD-10-CM | POA: Diagnosis present

## 2017-01-09 DIAGNOSIS — E7800 Pure hypercholesterolemia, unspecified: Secondary | ICD-10-CM | POA: Diagnosis present

## 2017-01-09 DIAGNOSIS — E872 Acidosis: Secondary | ICD-10-CM | POA: Diagnosis present

## 2017-01-09 DIAGNOSIS — R410 Disorientation, unspecified: Secondary | ICD-10-CM

## 2017-01-09 LAB — CBC WITH DIFFERENTIAL/PLATELET
BASOS PCT: 0 %
Basophils Absolute: 0 10*3/uL (ref 0–0.1)
EOS ABS: 0 10*3/uL (ref 0–0.7)
EOS PCT: 0 %
HCT: 40.6 % (ref 35.0–47.0)
HEMOGLOBIN: 13.7 g/dL (ref 12.0–16.0)
Lymphocytes Relative: 7 %
Lymphs Abs: 0.7 10*3/uL — ABNORMAL LOW (ref 1.0–3.6)
MCH: 29.6 pg (ref 26.0–34.0)
MCHC: 33.7 g/dL (ref 32.0–36.0)
MCV: 87.8 fL (ref 80.0–100.0)
MONO ABS: 1.1 10*3/uL — AB (ref 0.2–0.9)
MONOS PCT: 11 %
NEUTROS PCT: 82 %
Neutro Abs: 8.1 10*3/uL — ABNORMAL HIGH (ref 1.4–6.5)
PLATELETS: 238 10*3/uL (ref 150–440)
RBC: 4.62 MIL/uL (ref 3.80–5.20)
RDW: 13.1 % (ref 11.5–14.5)
WBC: 9.9 10*3/uL (ref 3.6–11.0)

## 2017-01-09 LAB — URINALYSIS, COMPLETE (UACMP) WITH MICROSCOPIC
Bilirubin Urine: NEGATIVE
Glucose, UA: NEGATIVE mg/dL
Ketones, ur: NEGATIVE mg/dL
Nitrite: POSITIVE — AB
PH: 5 (ref 5.0–8.0)
Protein, ur: 30 mg/dL — AB
SPECIFIC GRAVITY, URINE: 1.016 (ref 1.005–1.030)
Squamous Epithelial / LPF: NONE SEEN

## 2017-01-09 LAB — COMPREHENSIVE METABOLIC PANEL
ALT: 17 U/L (ref 14–54)
ANION GAP: 12 (ref 5–15)
AST: 26 U/L (ref 15–41)
Albumin: 4.2 g/dL (ref 3.5–5.0)
Alkaline Phosphatase: 53 U/L (ref 38–126)
BILIRUBIN TOTAL: 1 mg/dL (ref 0.3–1.2)
BUN: 24 mg/dL — AB (ref 6–20)
CO2: 21 mmol/L — ABNORMAL LOW (ref 22–32)
Calcium: 9.7 mg/dL (ref 8.9–10.3)
Chloride: 102 mmol/L (ref 101–111)
Creatinine, Ser: 1.36 mg/dL — ABNORMAL HIGH (ref 0.44–1.00)
GFR calc Af Amer: 44 mL/min — ABNORMAL LOW (ref >60)
GFR, EST NON AFRICAN AMERICAN: 38 mL/min — AB (ref >60)
Glucose, Bld: 156 mg/dL — ABNORMAL HIGH (ref 65–99)
Potassium: 3.2 mmol/L — ABNORMAL LOW (ref 3.5–5.1)
Sodium: 135 mmol/L (ref 135–145)
TOTAL PROTEIN: 7.4 g/dL (ref 6.5–8.1)

## 2017-01-09 LAB — PROTIME-INR
INR: 1.22
PROTHROMBIN TIME: 15.5 s — AB (ref 11.4–15.2)

## 2017-01-09 LAB — GLUCOSE, CAPILLARY: Glucose-Capillary: 164 mg/dL — ABNORMAL HIGH (ref 65–99)

## 2017-01-09 LAB — LACTIC ACID, PLASMA
Lactic Acid, Venous: 2.4 mmol/L (ref 0.5–1.9)
Lactic Acid, Venous: 2.6 mmol/L (ref 0.5–1.9)

## 2017-01-09 MED ORDER — INSULIN ASPART 100 UNIT/ML ~~LOC~~ SOLN
0.0000 [IU] | Freq: Three times a day (TID) | SUBCUTANEOUS | Status: DC
  Administered 2017-01-10: 13:00:00 3 [IU] via SUBCUTANEOUS
  Administered 2017-01-11: 18:00:00 2 [IU] via SUBCUTANEOUS
  Filled 2017-01-09 (×3): qty 1

## 2017-01-09 MED ORDER — DEXTROSE 5 % IV SOLN
1.0000 g | Freq: Once | INTRAVENOUS | Status: AC
  Administered 2017-01-09: 1 g via INTRAVENOUS
  Filled 2017-01-09: qty 10

## 2017-01-09 MED ORDER — ATENOLOL-CHLORTHALIDONE 100-25 MG PO TABS: 1.0000 | ORAL_TABLET | Freq: Every day | ORAL | Status: DC

## 2017-01-09 MED ORDER — ACETAMINOPHEN 650 MG RE SUPP: 650.0000 mg | Freq: Four times a day (QID) | RECTAL | Status: DC | PRN

## 2017-01-09 MED ORDER — ATENOLOL 100 MG PO TABS
100.0000 mg | ORAL_TABLET | Freq: Every day | ORAL | Status: DC
  Administered 2017-01-10: 100 mg via ORAL
  Filled 2017-01-09 (×2): qty 1

## 2017-01-09 MED ORDER — ONDANSETRON HCL 4 MG/2ML IJ SOLN: 4.0000 mg | Freq: Four times a day (QID) | INTRAMUSCULAR | Status: DC | PRN

## 2017-01-09 MED ORDER — ENOXAPARIN SODIUM 40 MG/0.4ML ~~LOC~~ SOLN
40.0000 mg | SUBCUTANEOUS | Status: DC
  Administered 2017-01-09 – 2017-01-11 (×3): 40 mg via SUBCUTANEOUS
  Filled 2017-01-09 (×3): qty 0.4

## 2017-01-09 MED ORDER — CHLORTHALIDONE 25 MG PO TABS
25.0000 mg | ORAL_TABLET | Freq: Every day | ORAL | Status: DC
  Administered 2017-01-10: 10:00:00 25 mg via ORAL
  Filled 2017-01-09 (×2): qty 1

## 2017-01-09 MED ORDER — IBUPROFEN 400 MG PO TABS
400.0000 mg | ORAL_TABLET | Freq: Four times a day (QID) | ORAL | Status: DC | PRN
Start: 2017-01-09 — End: 2017-01-12
  Administered 2017-01-10: 17:00:00 400 mg via ORAL

## 2017-01-09 MED ORDER — ACETAMINOPHEN 325 MG PO TABS
650.0000 mg | ORAL_TABLET | Freq: Four times a day (QID) | ORAL | Status: DC | PRN
  Administered 2017-01-09 – 2017-01-10 (×3): 650 mg via ORAL
  Filled 2017-01-09 (×3): qty 2

## 2017-01-09 MED ORDER — ASPIRIN EC 81 MG PO TBEC
81.0000 mg | DELAYED_RELEASE_TABLET | Freq: Every day | ORAL | Status: DC
  Administered 2017-01-10 – 2017-01-12 (×3): 81 mg via ORAL
  Filled 2017-01-09 (×3): qty 1

## 2017-01-09 MED ORDER — POLYETHYLENE GLYCOL 3350 17 G PO PACK: 17.0000 g | PACK | Freq: Every day | ORAL | Status: DC | PRN

## 2017-01-09 MED ORDER — SIMVASTATIN 10 MG PO TABS
5.0000 mg | ORAL_TABLET | Freq: Every day | ORAL | Status: DC
  Administered 2017-01-09 – 2017-01-11 (×3): 5 mg via ORAL
  Filled 2017-01-09: qty 1
  Filled 2017-01-09 (×3): qty 0.5

## 2017-01-09 MED ORDER — SODIUM CHLORIDE 0.9 % IV BOLUS (SEPSIS)
1000.0000 mL | Freq: Once | INTRAVENOUS | Status: AC
  Administered 2017-01-09: 1000 mL via INTRAVENOUS

## 2017-01-09 MED ORDER — METFORMIN HCL 500 MG PO TABS
500.0000 mg | ORAL_TABLET | Freq: Two times a day (BID) | ORAL | Status: DC
  Administered 2017-01-10 – 2017-01-12 (×5): 500 mg via ORAL
  Filled 2017-01-09 (×7): qty 1

## 2017-01-09 MED ORDER — DEXTROSE 5 % IV SOLN
1.0000 g | INTRAVENOUS | Status: DC
  Filled 2017-01-09: qty 10

## 2017-01-09 MED ORDER — ONDANSETRON HCL 4 MG PO TABS: 4.0000 mg | ORAL_TABLET | Freq: Four times a day (QID) | ORAL | Status: DC | PRN

## 2017-01-09 MED ORDER — POTASSIUM CHLORIDE CRYS ER 20 MEQ PO TBCR
40.0000 meq | EXTENDED_RELEASE_TABLET | ORAL | Status: AC
  Administered 2017-01-09 (×2): 40 meq via ORAL
  Filled 2017-01-09 (×2): qty 2

## 2017-01-09 NOTE — ED Notes (Signed)
Patient transported to X-ray. Pt unable to urinate, will I&O when back

## 2017-01-09 NOTE — ED Notes (Signed)
Pt transported by Misty StanleyLisa, EDT.

## 2017-01-09 NOTE — ED Notes (Signed)
Date and time results received: 01/09/17 1815 (use smartphrase ".now" to insert current time)  Test: lactic acid Critical Value: 2.4  Name of Provider Notified: Alphonzo Lemmingsmcshane

## 2017-01-09 NOTE — H&P (Addendum)
[facility] [place]   PATIENT NAME: Tamara PayorMartha [name]    MR#:  [id number]  DATE OF BIRTH:  09-15-1940  DATE OF ADMISSION:  01/09/2017  PRIMARY CARE PHYSICIAN: [facility]   REQUESTING/REFERRING PHYSICIAN: Dr. Alphonzo LemmingsMcShane  CHIEF COMPLAINT:   Chief Complaint  Patient presents with  . Altered Mental Status    HISTORY OF PRESENT ILLNESS:  Tamara [name]  is a 76 y.o. female with a known history of Diabetes, hypertension, hyperlipidemia presents to the hospital sent in from work after she was noticed to be confused. Patient works at a group home. There she was thought to be disabled which is abnormal. She was also confused. Here in the emergency room patient has been found to have a UTI with elevated lactic acid and fever of 100.8. No tachycardia or hypotension. Blood pressure is low normal range. Patient feels improved at this time. History obtained from family at bedside, patient, old records and ER staff.  She has noticed foul-smelling urine with incontinence since yesterday along with chills. Took some ibuprofen. No abdominal pain or back pain. No dysuria. Had pyelonephritis in 2013. No recent antibiotic use.  PAST MEDICAL HISTORY:   Past Medical History:  Diagnosis Date  . Diabetes mellitus without complication (HCC)   . Hypercholesteremia   . Hypertension     PAST SURGICAL HISTORY:   Past Surgical History:  Procedure Laterality Date  . EYE SURGERY      SOCIAL HISTORY:   Social History  Substance Use Topics  . Smoking status: Never Smoker  . Smokeless tobacco: Never Used  . Alcohol use No    FAMILY HISTORY:   Family History  Problem Relation Age of Onset  . Diabetes type II Brother   . Hypertension Brother     DRUG ALLERGIES:  No Known Allergies  REVIEW OF SYSTEMS:   Review of Systems  Constitutional: Positive for chills, fever and malaise/fatigue. Negative for weight loss.  HENT: Negative for hearing loss and  nosebleeds.   Eyes: Negative for blurred vision, double vision and pain.  Respiratory: Negative for cough, hemoptysis, sputum production, shortness of breath and wheezing.   Cardiovascular: Negative for chest pain, palpitations, orthopnea and leg swelling.  Gastrointestinal: Negative for abdominal pain, constipation, diarrhea, nausea and vomiting.  Genitourinary: Positive for frequency. Negative for dysuria and hematuria.  Musculoskeletal: Negative for back pain, falls and myalgias.  Skin: Negative for rash.  Neurological: Positive for weakness. Negative for dizziness, tremors, sensory change, speech change, focal weakness, seizures and headaches.  Endo/Heme/Allergies: Does not bruise/bleed easily.  Psychiatric/Behavioral: Positive for memory loss. Negative for depression. The patient is not nervous/anxious.     MEDICATIONS AT HOME:   Prior to Admission medications   Medication Sig Start Date End Date Taking? Authorizing Provider  amLODipine-valsartan (EXFORGE) 5-160 MG tablet Take by mouth.    [provider]  aspirin 81 MG tablet Take 81 mg by mouth daily.    [provider]  atenolol-chlorthalidone (TENORETIC) 100-25 MG tablet  07/27/15   [provider]  Cholecalciferol (VITAMIN D-1000 MAX ST) 1000 units tablet Take by mouth.    [provider]  ciclopirox (PENLAC) 8 % solution Apply topically. 02/03/14   [provider]  metFORMIN (GLUCOPHAGE) 500 MG tablet Take 500 mg by mouth 2 (two) times daily with a meal.    [provider]  Probiotic Product (ALIGN PO) Take by mouth.    [provider]  simvastatin (ZOCOR) 10 MG tablet  07/26/15   [provider]  simvastatin (ZOCOR) 5 MG tablet Take 5 mg by mouth at bedtime.    [provider]  trimethoprim (TRIMPEX) 100 MG tablet Take 100 mg by mouth daily.    [provider]  Vitamin D, Ergocalciferol, (DRISDOL) 50000 UNITS CAPS capsule Take by mouth. Dosage  unknown    [provider]     VITAL SIGNS:  Blood pressure 100/67, pulse (!) 55, temperature (!) 100.8 F (38.2 C), temperature source Oral, resp. rate (!) 26, height 5\' 5"  (1.651 m), weight 68 kg (150 lb), SpO2 98 %.  PHYSICAL EXAMINATION:  Physical Exam  GENERAL:  76 y.o.-year-old patient lying in the bed with no acute distress.  EYES: Pupils equal, round, reactive to light and accommodation. No scleral icterus. Extraocular muscles intact.  HEENT: Head atraumatic, normocephalic. Oropharynx and nasopharynx clear. No oropharyngeal erythema, moist oral mucosa  NECK:  Supple, no jugular venous distention. No thyroid enlargement, no tenderness.  LUNGS: Normal breath sounds bilaterally, no wheezing, rales, rhonchi. No use of accessory muscles of respiration.  CARDIOVASCULAR: S1, S2 normal. No murmurs, rubs, or gallops.  ABDOMEN: Soft, nontender, nondistended. Bowel sounds present. No organomegaly or mass.  EXTREMITIES: No pedal edema, cyanosis, or clubbing. + 2 pedal & radial pulses b/l.   NEUROLOGIC: Cranial nerves II through XII are intact. No focal Motor or sensory deficits appreciated b/l PSYCHIATRIC: The patient is alert and oriented x 3. Good affect.  SKIN: No obvious rash, lesion, or ulcer.   LABORATORY PANEL:   CBC  Recent Labs Lab 01/09/17 1725  WBC 9.9  HGB 13.7  HCT 40.6  PLT 238   ------------------------------------------------------------------------------------------------------------------  Chemistries   Recent Labs Lab 01/09/17 1725  NA 135  K 3.2*  CL 102  CO2 21*  GLUCOSE 156*  BUN 24*  CREATININE 1.36*  CALCIUM 9.7  AST 26  ALT 17  ALKPHOS 53  BILITOT 1.0   ------------------------------------------------------------------------------------------------------------------  Cardiac Enzymes No results for input(s): TROPONINI in the last 168  hours. ------------------------------------------------------------------------------------------------------------------  RADIOLOGY:  Dg Chest 2 View  Result Date: 01/09/2017 CLINICAL DATA:  Fever.  Altered mental status. EXAM: CHEST  2 VIEW COMPARISON:  10/21/2011 FINDINGS: The cardiomediastinal contours are unchanged, heart at the upper limits normal in size. The lungs are clear. Pulmonary vasculature is normal. No consolidation, pleural effusion, or pneumothorax. No acute osseous abnormalities are seen. IMPRESSION: No acute pulmonary process. Electronically Signed   By: Rubye Oaks M.D.   On: 01/09/2017 18:02     IMPRESSION AND PLAN:   * UTI with acute encephalopathy, fever and elevated lactic acid. Bolus normal saline 1 L stat. We will start on IV ceftriaxone. Culture sent and pending. No CVA tenderness tenderness or abdominal pain. Acute encephalopathy is improving.  * Hypertension. Hold medications at this time. Can restart in the morning if blood pressure improved.  * Diabetes mellitus. On metformin at home. Sliding scale insulin ordered.  * Hypokalemia. We'll replace orally.  * DVT prophylaxis with Lovenox  All the records are reviewed and case discussed with ED provider. Management plans discussed with the patient, family and they are in agreement.  CODE STATUS: FULL CODE  TOTAL Critical care TIME SPENT IN STABILIZING THIS PATIENT lactic acidosis and UTI with fever: 40 minutes.   Milagros Loll R M.D on 01/09/2017 at 6:58 PM  Between 7am to 6pm - Pager - [phone]  After 6pm go to [email or web address] - password [email or web address]  [facility]  Office  [phone]  CC: Primary care physician; [facility]  Note: This dictation was prepared with Dragon dictation along with smaller phrase technology. Any transcriptional errors that result from this process are unintentional.

## 2017-01-09 NOTE — ED Triage Notes (Signed)
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  Pt has had some confusion per EMS report.  Pt oriented now. Febrile with EMS.

## 2017-01-09 NOTE — ED Notes (Signed)
Pt arrived at work disheveled compared to baseline and co workers called EMS. Pt alert and oriented here.  respirations unlabored. No pain.  Has odor of UTI.  No cough. Warm to touch. Temp 102.8 with EMS and down under 101 on arrival here after tylenol with EMS.  Pt felt bad yesterday per report but no specific complaints.  VSS.

## 2017-01-09 NOTE — ED Provider Notes (Signed)
[facility] Emergency Department Provider Note  ____________________________________________   I have reviewed the triage vital signs and the nursing notes.   HISTORY  Chief Complaint Altered Mental Status    HPI Tamara [name] is a 76 y.o. 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 They reported that she was confused there. Patient states she did not feel confused. Patient states that she has had no complaints recently. She did feel little bit bad yesterday but can't specify how. Denies any headache, stiff neck, nausea vomiting diarrhea abdominal pain, dysuria or urinary frequency, she denies cough or headache or stiff neck or any other complaint. She states she feels "pretty good". EMS found her with a temperature of over 102 however and they did give her Tylenol.      Past Medical History:  Diagnosis Date  . Diabetes mellitus without complication (HCC)   . Hypercholesteremia   . Hypertension     There are no active problems to display for this patient.   Past Surgical History:  Procedure Laterality Date  . EYE SURGERY      Prior to Admission medications   Medication Sig Start Date End Date Taking? Authorizing Provider  amLODipine-valsartan (EXFORGE) 5-160 MG tablet Take by mouth.    [provider]  aspirin 81 MG tablet Take 81 mg by mouth daily.    [provider]  atenolol-chlorthalidone (TENORETIC) 100-25 MG tablet  07/27/15   [provider]  Cholecalciferol (VITAMIN D-1000 MAX ST) 1000 units tablet Take by mouth.    [provider]  ciclopirox (PENLAC) 8 % solution Apply topically. 02/03/14   [provider]  metFORMIN (GLUCOPHAGE) 500 MG tablet Take 500 mg by mouth 2 (two) times daily with a meal.    [provider]  Probiotic Product (ALIGN PO) Take by mouth.    [provider]  simvastatin  (ZOCOR) 10 MG tablet  07/26/15   [provider]  simvastatin (ZOCOR) 5 MG tablet Take 5 mg by mouth at bedtime.    [provider]  trimethoprim (TRIMPEX) 100 MG tablet Take 100 mg by mouth daily.    [provider]  Vitamin D, Ergocalciferol, (DRISDOL) 50000 UNITS CAPS capsule Take by mouth. Dosage unknown    [provider]    Allergies Patient has no known allergies.  Family History  Problem Relation Age of Onset  . Diabetes type II Brother   . Hypertension Brother     Social History Social History  Substance Use Topics  . Smoking status: Never Smoker  . Smokeless tobacco: Never Used  . Alcohol use No    Review of Systems Constitutional: Positive fever/chills Eyes: No visual changes. ENT: No sore throat. No stiff neck no neck pain Cardiovascular: Denies chest pain. Respiratory: Denies shortness of breath. Gastrointestinal:   no vomiting.  No diarrhea.  No constipation. Genitourinary: Negative for dysuria. Musculoskeletal: Negative lower extremity swelling Skin: Negative for rash. Neurological: Negative for severe headaches, focal weakness or numbness.   ____________________________________________   PHYSICAL EXAM:  VITAL SIGNS: ED Triage Vitals  Enc Vitals Group     BP 01/09/17 1728 113/60     Pulse Rate 01/09/17 1728 65     Resp 01/09/17 1728 (!) 23     Temp 01/09/17 1723 99.3 F (37.4 C)     Temp Source 01/09/17 1723 Oral     SpO2 01/09/17 1728 98 %  Weight 01/09/17 1738 150 lb (68 kg)     Height 01/09/17 1738 5\' 5"  (1.651 m)     Head Circumference --      Peak Flow --      Pain Score --      Pain Loc --      Pain Edu? --      Excl. in GC? --     Constitutional: Alert and oriented. Well appearing and in no acute distress. Eyes: Conjunctivae are normal Head: Atraumatic HEENT: No congestion/rhinnorhea. Mucous membranes are moist.  Oropharynx non-erythematous Neck:   Nontender with no meningismus, no masses, no  stridor Cardiovascular: Normal rate, regular rhythm. Grossly normal heart sounds.  Good peripheral circulation. Respiratory: Normal respiratory effort.  No retractions. Lungs CTAB. Abdominal: Soft and nontender. No distention. No guarding no rebound Back:  There is no focal tenderness or step off.  there is no midline tenderness there are no lesions noted. there is no CVA tenderness  Musculoskeletal: No lower extremity tenderness, no upper extremity tenderness. No joint effusions, no DVT signs strong distal pulses no edema Neurologic:  Normal speech and language. No gross focal neurologic deficits are appreciated.  Skin:  Skin is warm, dry and intact. No rash noted. Psychiatric: Mood and affect are normal. Speech and behavior are normal.  ____________________________________________   LABS (all labs ordered are listed, but only abnormal results are displayed)  Labs Reviewed  COMPREHENSIVE METABOLIC PANEL - Abnormal; Notable for the following:       Result Value   Potassium 3.2 (*)    CO2 21 (*)    Glucose, Bld 156 (*)    BUN 24 (*)    Creatinine, Ser 1.36 (*)    GFR calc non Af Amer 38 (*)    GFR calc Af Amer 44 (*)    All other components within normal limits  LACTIC ACID, PLASMA - Abnormal; Notable for the following:    Lactic Acid, Venous 2.4 (*)    All other components within normal limits  CBC WITH DIFFERENTIAL/PLATELET - Abnormal; Notable for the following:    Neutro Abs 8.1 (*)    Lymphs Abs 0.7 (*)    Monocytes Absolute 1.1 (*)    All other components within normal limits  PROTIME-INR - Abnormal; Notable for the following:    Prothrombin Time 15.5 (*)    All other components within normal limits  URINALYSIS, COMPLETE (UACMP) WITH MICROSCOPIC - Abnormal; Notable for the following:    Color, Urine YELLOW (*)    APPearance HAZY (*)    Hgb urine dipstick MODERATE (*)    Protein, ur 30 (*)    Nitrite POSITIVE (*)    Leukocytes, UA SMALL (*)    Bacteria, UA MANY (*)     All other components within normal limits  CULTURE, BLOOD (ROUTINE X 2)  CULTURE, BLOOD (ROUTINE X 2)  URINE CULTURE  LACTIC ACID, PLASMA   ____________________________________________  EKG  I personally interpreted any EKGs ordered by me or triage Sinus rhythm at 60 beats per minute no acute ST elevation acute ST depression, normal axis no sitting ST change ____________________________________________  RADIOLOGY  I reviewed any imaging ordered by me or triage that were performed during my shift and, if possible, patient and/or family made aware of any abnormal findings. ____________________________________________   PROCEDURES  Procedure(s) performed: None  Procedures  Critical Care performed: None  ____________________________________________   INITIAL IMPRESSION / ASSESSMENT AND PLAN / ED COURSE  Pertinent labs & imaging  results that were available during my care of the patient were reviewed by me and considered in my medical decision making (see chart for details).  Patient here with fever and reported confusion at work although she got here her fever had come down and she was no longer confused. Very reassuring workup. Lactic is borderline elevated but no other signs of sepsis. We'll give her IV fluid. She has a clear urinary tract infection, we are sending her urine culture. Prior culture shows pansensitive E. We'll give her Rocephin. Because of the patient's confusion at work as well as her diabetes and infection as well as some degree of dehydration I feel patient would benefit from admission.    ____________________________________________   FINAL CLINICAL IMPRESSION(S) / ED DIAGNOSES  Final diagnoses:  Lower urinary tract infectious disease  Confusion      This chart was dictated using voice recognition software.  Despite best efforts to proofread,  errors can occur which can change meaning.      Jeanmarie Plant, MD 01/09/17 [phone]

## 2017-01-10 ENCOUNTER — Inpatient Hospital Stay: Payer: Medicare Other

## 2017-01-10 LAB — BASIC METABOLIC PANEL WITH GFR
Anion gap: 7 (ref 5–15)
BUN: 25 mg/dL — ABNORMAL HIGH (ref 6–20)
CO2: 23 mmol/L (ref 22–32)
Calcium: 8.8 mg/dL — ABNORMAL LOW (ref 8.9–10.3)
Chloride: 108 mmol/L (ref 101–111)
Creatinine, Ser: 1.48 mg/dL — ABNORMAL HIGH (ref 0.44–1.00)
GFR calc Af Amer: 39 mL/min — ABNORMAL LOW
GFR calc non Af Amer: 34 mL/min — ABNORMAL LOW
Glucose, Bld: 210 mg/dL — ABNORMAL HIGH (ref 65–99)
Potassium: 3.6 mmol/L (ref 3.5–5.1)
Sodium: 138 mmol/L (ref 135–145)

## 2017-01-10 LAB — CBC
HCT: 36.9 % (ref 35.0–47.0)
HEMOGLOBIN: 12.8 g/dL (ref 12.0–16.0)
MCH: 30.2 pg (ref 26.0–34.0)
MCHC: 34.8 g/dL (ref 32.0–36.0)
MCV: 86.8 fL (ref 80.0–100.0)
Platelets: 185 10*3/uL (ref 150–440)
RBC: 4.25 MIL/uL (ref 3.80–5.20)
RDW: 13.3 % (ref 11.5–14.5)
WBC: 10.3 10*3/uL (ref 3.6–11.0)

## 2017-01-10 LAB — GLUCOSE, CAPILLARY
GLUCOSE-CAPILLARY: 125 mg/dL — AB (ref 65–99)
GLUCOSE-CAPILLARY: 215 mg/dL — AB (ref 65–99)
GLUCOSE-CAPILLARY: 184 mg/dL — AB (ref 65–99)
Glucose-Capillary: 128 mg/dL — ABNORMAL HIGH (ref 65–99)

## 2017-01-10 LAB — LACTIC ACID, PLASMA: LACTIC ACID, VENOUS: 1.5 mmol/L (ref 0.5–1.9)

## 2017-01-10 MED ORDER — IBUPROFEN 400 MG PO TABS
400.0000 mg | ORAL_TABLET | Freq: Once | ORAL | Status: AC
  Filled 2017-01-10: qty 1

## 2017-01-10 MED ORDER — PIPERACILLIN-TAZOBACTAM 3.375 G IVPB
3.3750 g | Freq: Three times a day (TID) | INTRAVENOUS | Status: DC
  Administered 2017-01-10 – 2017-01-12 (×6): 3.375 g via INTRAVENOUS
  Filled 2017-01-10 (×6): qty 50

## 2017-01-10 MED ORDER — SODIUM CHLORIDE 0.9 % IV SOLN
INTRAVENOUS | Status: AC
  Administered 2017-01-10: 10:00:00 via INTRAVENOUS

## 2017-01-10 NOTE — Progress Notes (Signed)
Pharmacy Antibiotic Note  Tamara GunningMartha M [name] is a 76 y.o. female admitted on 01/09/2017 with UTI.  Pharmacy has been consulted for Zosyn dosing. Patient previously on ceftriaxone but changed to Zosyn due to increase in fever and WBC.   Plan: Zosyn 3.375g IV q8h (4 hour infusion).  Height: 5\' 5"  (165.1 cm) Weight: 151 lb 1.6 oz (68.5 kg) IBW/kg (Calculated) : 57  Temp (24hrs), Avg:100.1 F (37.8 C), Min:97.8 F (36.6 C), Max:102.2 F (39 C)   Recent Labs Lab 01/09/17 1725 01/09/17 2045 01/10/17 0113  WBC 9.9  --  10.3  CREATININE 1.36*  --  1.48*  LATICACIDVEN 2.4* 2.6* 1.5    Estimated Creatinine Clearance: 32.9 mL/min (A) (by C-G formula based on SCr of 1.48 mg/dL (H)).    No Known Allergies  Antimicrobials this admission 07/10 Ceftriazone >> 07/11 07/11 Zosyn >>   Dose adjustments this admission:   Microbiology results: 07/10 BCx: NG TD 07/10 UCx: Pending    Thank you for allowing pharmacy to be a part of this patient's care.  Gardner CandleSheema M [name], PharmD, BCPS Clinical Pharmacist 01/10/2017 4:40 PM

## 2017-01-10 NOTE — Progress Notes (Signed)
Dr. Bernadene BellVachhnai notified temperature has increased to 102.2 one hour after Tylenol given. Verbal order read back and verified for Motrin 400mg  once and Zosyn Pharmacy consult. Patient and patient's daughter updated.

## 2017-01-10 NOTE — Progress Notes (Signed)
Dr. Elisabeth PigeonVachhani notified patient spiked temperature 101.6  No new orders- continue Rocephin Q24H & Tylenol PRN. Awaiting Urology consult. Patient and patient's daughter at bedside updated on current plan of care.

## 2017-01-10 NOTE — Progress Notes (Signed)
[facility] - [place] at [facility]   PATIENT NAME: Tamara PayorMartha [name]    MR#:  [id number]  DATE OF BIRTH:  1940/08/29  SUBJECTIVE:  CHIEF COMPLAINT:   Chief Complaint  Patient presents with  . Altered Mental Status     Came with altered mental status and found to have UTI. As per patient's daughter who is present in the room she has recurrent UTI complaints. And she follows with urologist in [place]. Patient is completely alert and oriented today.  REVIEW OF SYSTEMS:  CONSTITUTIONAL: No fever,Positive fatigue or weakness.  EYES: No blurred or double vision.  EARS, NOSE, AND THROAT: No tinnitus or ear pain.  RESPIRATORY: No cough, shortness of breath, wheezing or hemoptysis.  CARDIOVASCULAR: No chest pain, orthopnea, edema.  GASTROINTESTINAL: No nausea, vomiting, diarrhea or abdominal pain.  GENITOURINARY: No dysuria, hematuria.  ENDOCRINE: No polyuria, nocturia,  HEMATOLOGY: No anemia, easy bruising or bleeding SKIN: No rash or lesion. MUSCULOSKELETAL: No joint pain or arthritis.   NEUROLOGIC: No tingling, numbness, weakness.  PSYCHIATRY: No anxiety or depression.   ROS  DRUG ALLERGIES:  No Known Allergies  VITALS:  Blood pressure (!) 130/49, pulse 68, temperature (!) 101.6 F (38.7 C), temperature source Oral, resp. rate 18, height 5\' 5"  (1.651 m), weight 68.5 kg (151 lb 1.6 oz), SpO2 98 %.  PHYSICAL EXAMINATION:  GENERAL:  [age] y.o.-year-old patient lying in the bed with no acute distress.  EYES: Pupils equal, round, reactive to light and accommodation. No scleral icterus. Extraocular muscles intact.  HEENT: Head atraumatic, normocephalic. Oropharynx and nasopharynx clear.  NECK:  Supple, no jugular venous distention. No thyroid enlargement, no tenderness.  LUNGS: Normal breath sounds bilaterally, no wheezing, rales,rhonchi or crepitation. No use of accessory muscles of respiration.  CARDIOVASCULAR: S1, S2 normal. No murmurs, rubs, or gallops.  ABDOMEN:  Soft, nontender, nondistended. Bowel sounds present. No organomegaly or mass.  EXTREMITIES: No pedal edema, cyanosis, or clubbing.  NEUROLOGIC: Cranial nerves II through XII are intact. Muscle strength 5/5 in all extremities. Sensation intact. Gait not checked.  PSYCHIATRIC: The patient is alert and oriented x 3.  SKIN: No obvious rash, lesion, or ulcer.   Physical Exam LABORATORY PANEL:   CBC  Recent Labs Lab 01/10/17 0113  WBC 10.3  HGB 12.8  HCT 36.9  PLT 185   ------------------------------------------------------------------------------------------------------------------  Chemistries   Recent Labs Lab 01/09/17 1725 01/10/17 0113  NA 135 138  K 3.2* 3.6  CL 102 108  CO2 21* 23  GLUCOSE 156* 210*  BUN 24* 25*  CREATININE 1.36* 1.48*  CALCIUM 9.7 8.8*  AST 26  --   ALT 17  --   ALKPHOS 53  --   BILITOT 1.0  --    ------------------------------------------------------------------------------------------------------------------  Cardiac Enzymes No results for input(s): TROPONINI in the last 168 hours. ------------------------------------------------------------------------------------------------------------------  RADIOLOGY:  Dg Chest 2 View  Result Date: 01/09/2017 CLINICAL DATA:  Fever.  Altered mental status. EXAM: CHEST  2 VIEW COMPARISON:  10/21/2011 FINDINGS: The cardiomediastinal contours are unchanged, heart at the upper limits normal in size. The lungs are clear. Pulmonary vasculature is normal. No consolidation, pleural effusion, or pneumothorax. No acute osseous abnormalities are seen. IMPRESSION: No acute pulmonary process. Electronically Signed   By: Rubye OaksMelanie  Ehinger M.D.   On: 01/09/2017 18:02   [place] Renal  Result Date: 01/10/2017 CLINICAL DATA:  Acute renal failure. EXAM: RENAL / URINARY TRACT ULTRASOUND COMPLETE COMPARISON:  None. FINDINGS: Right Kidney: Length: 9.4 cm. Two renal cysts are present.  Cysts 1 Is parapelvic, measuring 4.3 x 2.9 x 2.8  cm. Cyst 2 Is medial upper pole, 2.4 x 2.8 x 1.6 cm. Echogenicity within normal limits. No mass or hydronephrosis visualized. Left Kidney: Length: 11.8 cm. A LEFT lateral midpole cyst measures 3.6 x 2.1 x 2.1 cm. Echogenicity within normal limits. No mass or hydronephrosis visualized. Bladder: Unremarkable. IMPRESSION: No hydronephrosis or renal atrophy. Incidental renal cystic disease. Electronically Signed   By: Elsie Stain M.D.   On: 01/10/2017 10:10    ASSESSMENT AND PLAN:   Active Problems:   UTI (urinary tract infection)  * UTI with acute encephalopathy, fever and elevated lactic acid. Bolus normal saline 1 L stat.  on IV ceftriaxone. Culture sent and pending. No CVA tenderness tenderness or abdominal pain. Acute encephalopathy is improving.   As per patient's daughter as she follows with urologist due to recurrent UTI and no clear reason was found but they were giving her chronic antibiotic therapy for prevention.  I spoke to urologist Dr.Brandon- patient is moving her care from [facility] urology to their team and has first appointment after 2 weeks. So suggested, if her ultrasound kidneys normal we can just treat the infection and let her follow as outpatient.  * Hypertension. Hold medications at this time.   * Diabetes mellitus. On metformin at home. Sliding scale insulin ordered.  * Hypokalemia. We'll replace orally.  * Acute renal failure    Likely secondary to UTI and dehydration, renal ultrasound does not show any obstruction or pathologies.   Continue IV fluid and recheck tomorrow.  * DVT prophylaxis with Lovenox    All the records are reviewed and case discussed with Care Management/Social Workerr. Management plans discussed with the patient, family and they are in agreement.  CODE STATUS: Full.  TOTAL TIME TAKING CARE OF THIS PATIENT: 35 minutes.   Discussed with patient's daughter in the room.  POSSIBLE D/C IN 1-2 DAYS, DEPENDING ON CLINICAL  CONDITION.   Altamese Dilling M.D on 01/10/2017   Between 7am to 6pm - Pager - [phone]  After 6pm go to [email or web address] - password [email or web address]  [facility] [place] [facility]  Office  [phone]  CC: Primary care physician; [facility]  Note: This dictation was prepared with Dragon dictation along with smaller phrase technology. Any transcriptional errors that result from this process are unintentional.

## 2017-01-11 LAB — HEMOGLOBIN A1C
Hgb A1c MFr Bld: 6.2 % — ABNORMAL HIGH (ref 4.8–5.6)
MEAN PLASMA GLUCOSE: 131 mg/dL

## 2017-01-11 LAB — BASIC METABOLIC PANEL
Anion gap: 7 (ref 5–15)
BUN: 21 mg/dL — ABNORMAL HIGH (ref 6–20)
CHLORIDE: 107 mmol/L (ref 101–111)
CO2: 25 mmol/L (ref 22–32)
CREATININE: 1.3 mg/dL — AB (ref 0.44–1.00)
Calcium: 9 mg/dL (ref 8.9–10.3)
GFR, EST AFRICAN AMERICAN: 46 mL/min — AB (ref >60)
GFR, EST NON AFRICAN AMERICAN: 40 mL/min — AB (ref >60)
Glucose, Bld: 108 mg/dL — ABNORMAL HIGH (ref 65–99)
Potassium: 3.3 mmol/L — ABNORMAL LOW (ref 3.5–5.1)
SODIUM: 139 mmol/L (ref 135–145)

## 2017-01-11 LAB — GLUCOSE, CAPILLARY
GLUCOSE-CAPILLARY: 100 mg/dL — AB (ref 65–99)
GLUCOSE-CAPILLARY: 97 mg/dL (ref 65–99)
Glucose-Capillary: 181 mg/dL — ABNORMAL HIGH (ref 65–99)
Glucose-Capillary: 208 mg/dL — ABNORMAL HIGH (ref 65–99)
Glucose-Capillary: 140 mg/dL — ABNORMAL HIGH (ref 65–99)

## 2017-01-11 MED ORDER — POTASSIUM CHLORIDE CRYS ER 20 MEQ PO TBCR
40.0000 meq | EXTENDED_RELEASE_TABLET | Freq: Two times a day (BID) | ORAL | Status: DC
  Administered 2017-01-11 – 2017-01-12 (×3): 40 meq via ORAL
  Filled 2017-01-11 (×3): qty 2

## 2017-01-11 NOTE — Progress Notes (Signed)
[facility] - [place] at [facility]   PATIENT NAME: Tamara [name]    MR#:  [id number]  DATE OF BIRTH:  [birth year]  SUBJECTIVE:  CHIEF COMPLAINT:   Chief Complaint  Patient presents with  . Altered Mental Status     Came with altered mental status and found to have UTI. As per patient's daughter who is present in the room she has recurrent UTI complaints. And she follows with urologist in [place]. Patient is completely alert and oriented today. Had high-grade fever yesterday and needed to upgrade her antibiotics. REVIEW OF SYSTEMS:  CONSTITUTIONAL: No fever,Positive fatigue or weakness.  EYES: No blurred or double vision.  EARS, NOSE, AND THROAT: No tinnitus or ear pain.  RESPIRATORY: No cough, shortness of breath, wheezing or hemoptysis.  CARDIOVASCULAR: No chest pain, orthopnea, edema.  GASTROINTESTINAL: No nausea, vomiting, diarrhea or abdominal pain.  GENITOURINARY: No dysuria, hematuria.  ENDOCRINE: No polyuria, nocturia,  HEMATOLOGY: No anemia, easy bruising or bleeding SKIN: No rash or lesion. MUSCULOSKELETAL: No joint pain or arthritis.   NEUROLOGIC: No tingling, numbness, weakness.  PSYCHIATRY: No anxiety or depression.   ROS  DRUG ALLERGIES:  No Known Allergies  VITALS:  Blood pressure 125/66, pulse 69, temperature 98.3 F (36.8 C), temperature source Oral, resp. rate 16, height 5\' 5"  (1.651 m), weight 72.1 kg (159 lb), SpO2 100 %.  PHYSICAL EXAMINATION:  GENERAL:  76 y.o.-year-old patient lying in the bed with no acute distress.  EYES: Pupils equal, round, reactive to light and accommodation. No scleral icterus. Extraocular muscles intact.  HEENT: Head atraumatic, normocephalic. Oropharynx and nasopharynx clear.  NECK:  Supple, no jugular venous distention. No thyroid enlargement, no tenderness.  LUNGS: Normal breath sounds bilaterally, no wheezing, rales,rhonchi or crepitation. No use of accessory muscles of respiration.  CARDIOVASCULAR:  S1, S2 normal. No murmurs, rubs, or gallops.  ABDOMEN: Soft, nontender, nondistended. Bowel sounds present. No organomegaly or mass.  EXTREMITIES: No pedal edema, cyanosis, or clubbing.  NEUROLOGIC: Cranial nerves II through XII are intact. Muscle strength 5/5 in all extremities. Sensation intact. Gait not checked.  PSYCHIATRIC: The patient is alert and oriented x 3.  SKIN: No obvious rash, lesion, or ulcer.   Physical Exam LABORATORY PANEL:   CBC  Recent Labs Lab 01/10/17 0113  WBC 10.3  HGB 12.8  HCT 36.9  PLT 185   ------------------------------------------------------------------------------------------------------------------  Chemistries   Recent Labs Lab 01/09/17 1725  01/11/17 0611  NA 135  < > 139  K 3.2*  < > 3.3*  CL 102  < > 107  CO2 21*  < > 25  GLUCOSE 156*  < > 108*  BUN 24*  < > 21*  CREATININE 1.36*  < > 1.30*  CALCIUM 9.7  < > 9.0  AST 26  --   --   ALT 17  --   --   ALKPHOS 53  --   --   BILITOT 1.0  --   --   < > = values in this interval not displayed. ------------------------------------------------------------------------------------------------------------------  Cardiac Enzymes No results for input(s): TROPONINI in the last 168 hours. ------------------------------------------------------------------------------------------------------------------  RADIOLOGY:  Dg Chest 2 View  Result Date: 01/09/2017 CLINICAL DATA:  Fever.  Altered mental status. EXAM: CHEST  2 VIEW COMPARISON:  10/21/2011 FINDINGS: The cardiomediastinal contours are unchanged, heart at the upper limits normal in size. The lungs are clear. Pulmonary vasculature is normal. No consolidation, pleural effusion, or pneumothorax. No acute osseous abnormalities are seen. IMPRESSION: No acute pulmonary  process. Electronically Signed   By: Rubye Oaks M.D.   On: 01/09/2017 18:02   US Renal  Result Date: 01/10/2017 CLINICAL DATA:  Acute renal failure. EXAM: RENAL / URINARY  TRACT ULTRASOUND COMPLETE COMPARISON:  None. FINDINGS: Right Kidney: Length: 9.4 cm. Two renal cysts are present. Cysts 1 Is parapelvic, measuring 4.3 x 2.9 x 2.8 cm. Cyst 2 Is medial upper pole, 2.4 x 2.8 x 1.6 cm. Echogenicity within normal limits. No mass or hydronephrosis visualized. Left Kidney: Length: 11.8 cm. A LEFT lateral midpole cyst measures 3.6 x 2.1 x 2.1 cm. Echogenicity within normal limits. No mass or hydronephrosis visualized. Bladder: Unremarkable. IMPRESSION: No hydronephrosis or renal atrophy. Incidental renal cystic disease. Electronically Signed   By: Elsie Stain M.D.   On: 01/10/2017 10:10    ASSESSMENT AND PLAN:   Active Problems:   UTI (urinary tract infection)  * UTI with acute encephalopathy, fever and elevated lactic acid. Bolus normal saline 1 L stat.  on IV ceftriaxone- changed to IV zosyn. Culture sent and pending. No CVA tenderness tenderness or abdominal pain. Acute encephalopathy is improving.   As per patient's daughter as she follows with urologist due to recurrent UTI and no clear reason was found but they were giving her chronic antibiotic therapy for prevention.  I spoke to urologist Dr.Brandon- patient is moving her care from [facility] urology to their team and has first appointment after 2 weeks. So suggested, if her ultrasound kidneys normal we can just treat the infection and let her follow as outpatient. US renal is without any abnormalities.  * Hypertension. Hold medications at this time.   * Diabetes mellitus. On metformin at home. Sliding scale insulin ordered.  * Hypokalemia. We'll replace orally.  * Acute renal failure    Likely secondary to UTI and dehydration, renal ultrasound does not show any obstruction or pathologies.   Continue IV fluid and recheck tomorrow. Improving.  * DVT prophylaxis with Lovenox    All the records are reviewed and case discussed with Care Management/Social Workerr. Management plans discussed with the  patient, family and they are in agreement.  CODE STATUS: Full.  TOTAL TIME TAKING CARE OF THIS PATIENT: 35 minutes.   Discussed with patient's daughter in the room. Due to requiring broad-spectrum antibiotic, I will wait for the culture result for sensitivity to decide her antibiotic on discharge.  POSSIBLE D/C IN 1-2 DAYS, DEPENDING ON CLINICAL CONDITION.   Altamese Dilling M.D on 01/11/2017   Between 7am to 6pm - Pager - [phone]  After 6pm go to [email or web address] - password [email or web address]  [facility] [place] [facility]  Office  [phone]  CC: Primary care physician; [facility]  Note: This dictation was prepared with Dragon dictation along with smaller phrase technology. Any transcriptional errors that result from this process are unintentional.

## 2017-01-12 LAB — BASIC METABOLIC PANEL
Anion gap: 6 (ref 5–15)
BUN: 17 mg/dL (ref 6–20)
CALCIUM: 8.8 mg/dL — AB (ref 8.9–10.3)
CO2: 24 mmol/L (ref 22–32)
CREATININE: 1.18 mg/dL — AB (ref 0.44–1.00)
Chloride: 106 mmol/L (ref 101–111)
GFR calc Af Amer: 52 mL/min — ABNORMAL LOW (ref >60)
GFR, EST NON AFRICAN AMERICAN: 45 mL/min — AB (ref >60)
Glucose, Bld: 126 mg/dL — ABNORMAL HIGH (ref 65–99)
Potassium: 4.4 mmol/L (ref 3.5–5.1)
Sodium: 136 mmol/L (ref 135–145)

## 2017-01-12 LAB — URINE CULTURE

## 2017-01-12 LAB — CBC
HEMATOCRIT: 36.3 % (ref 35.0–47.0)
Hemoglobin: 12.7 g/dL (ref 12.0–16.0)
MCH: 30.3 pg (ref 26.0–34.0)
MCHC: 35 g/dL (ref 32.0–36.0)
MCV: 86.6 fL (ref 80.0–100.0)
Platelets: 196 10*3/uL (ref 150–440)
RBC: 4.19 MIL/uL (ref 3.80–5.20)
RDW: 13.4 % (ref 11.5–14.5)
WBC: 7 10*3/uL (ref 3.6–11.0)

## 2017-01-12 LAB — GLUCOSE, CAPILLARY: GLUCOSE-CAPILLARY: 134 mg/dL — AB (ref 65–99)

## 2017-01-12 MED ORDER — TRIMETHOPRIM 100 MG PO TABS: 100.0000 mg | ORAL_TABLET | Freq: Every day | ORAL | 0 refills | Status: DC

## 2017-01-12 MED ORDER — CEFUROXIME AXETIL 250 MG PO TABS: 250.0000 mg | ORAL_TABLET | Freq: Two times a day (BID) | ORAL | 0 refills | Status: AC

## 2017-01-12 NOTE — Progress Notes (Signed)
Discharge paperwork reviewed with patient and patient's daughter who verbalize understanding of new antibiotic to take now and restart chronic antibiotic when course is complete as well as follow-up appointments. Work note attached. Patient is stable and ready for discharge. Patient's daughter to transport home.

## 2017-01-12 NOTE — Discharge Summary (Signed)
[facility] - [place] at [facility]   PATIENT NAME: Tamara [name]    MR#:  [id number]  DATE OF BIRTH:  [birth year]  DATE OF ADMISSION:  01/09/2017 ADMITTING PHYSICIAN: Milagros Loll, MD  DATE OF DISCHARGE: 01/12/2017   PRIMARY CARE PHYSICIAN: [facility]    ADMISSION DIAGNOSIS:  Lower urinary tract infectious disease [N39.0] Confusion [R41.0]  DISCHARGE DIAGNOSIS:  Active Problems:   UTI (urinary tract infection)   SECONDARY DIAGNOSIS:   Past Medical History:  Diagnosis Date  . Diabetes mellitus without complication (HCC)   . Hypercholesteremia   . Hypertension     HOSPITAL COURSE:   * UTI with acute encephalopathy, fever and elevated lactic acid. Bolus normal saline 1 L stat.  on IV ceftriaxone- changed to IV zosyn. Culture sent  No CVA tenderness tenderness or abdominal pain. Acute encephalopathy is improving.   As per patient's daughter as she follows with urologist due to recurrent UTI and no clear reason was found but they were giving her chronic antibiotic therapy for prevention.  I spoke to urologist Dr.Brandon- patient is moving her care from [facility] urology to their team and has first appointment after 2 weeks. So suggested, if her ultrasound kidneys normal we can just treat the infection and let her follow as outpatient. US renal is without any abnormalities.  Cx reported E coli- sensitive to cephalosporins, resistant to bactrim.   Will give 5 days more oral cefuroxime.  * Hypertension. Hold medications at this time.   resumed home meds, as BP stable now.  * Diabetes mellitus. On metformin at home. Sliding scale insulin ordered.  * Hypokalemia. We'll replace orally.  * Acute renal failure    Likely secondary to UTI and dehydration, renal ultrasound does not show any obstruction or pathologies.   Continue IV fluid and recheck tomorrow. Improving.  * DVT prophylaxis with Lovenox   DISCHARGE CONDITIONS:    Stable.  CONSULTS OBTAINED:  Treatment Team:  Vanna Scotland, MD  DRUG ALLERGIES:  No Known Allergies  DISCHARGE MEDICATIONS:   Current Discharge Medication List    START taking these medications   Details  cefUROXime (CEFTIN) 250 MG tablet Take 1 tablet (250 mg total) by mouth 2 (two) times daily. Qty: 10 tablet, Refills: 0      CONTINUE these medications which have CHANGED   Details  trimethoprim (TRIMPEX) 100 MG tablet Take 1 tablet (100 mg total) by mouth daily. Qty: 20 tablet, Refills: 0      CONTINUE these medications which have NOT CHANGED   Details  amLODipine-valsartan (EXFORGE) 5-160 MG tablet Take 1 tablet by mouth daily.     aspirin 81 MG tablet Take 81 mg by mouth daily.    atenolol-chlorthalidone (TENORETIC) 100-25 MG tablet Take 1 tablet by mouth daily.     Cholecalciferol (VITAMIN D-1000 MAX ST) 1000 units tablet Take 1,000 Units by mouth daily.     metFORMIN (GLUCOPHAGE) 500 MG tablet Take 500 mg by mouth 2 (two) times daily with a meal.    simvastatin (ZOCOR) 10 MG tablet Take 10 mg by mouth daily at 6 PM.          DISCHARGE INSTRUCTIONS:    Follow with urology in 2 weeks.  If you experience worsening of your admission symptoms, develop shortness of breath, life threatening emergency, suicidal or homicidal thoughts you must seek medical attention immediately by calling 911 or calling your MD immediately  if symptoms less severe.  You Must read complete instructions/literature  along with all the possible adverse reactions/side effects for all the Medicines you take and that have been prescribed to you. Take any new Medicines after you have completely understood and accept all the possible adverse reactions/side effects.   Please note  You were cared for by a hospitalist during your hospital stay. If you have any questions about your discharge medications or the care you received while you were in the hospital after you are discharged, you  can call the unit and asked to speak with the hospitalist on call if the hospitalist that took care of you is not available. Once you are discharged, your primary care physician will handle any further medical issues. Please note that NO REFILLS for any discharge medications will be authorized once you are discharged, as it is imperative that you return to your primary care physician (or establish a relationship with a primary care physician if you do not have one) for your aftercare needs so that they can reassess your need for medications and monitor your lab values.    Today   CHIEF COMPLAINT:   Chief Complaint  Patient presents with  . Altered Mental Status    HISTORY OF PRESENT ILLNESS:  Tamara [name]  is a 76 y.o. female with a known history of Diabetes, hypertension, hyperlipidemia presents to the hospital sent in from work after she was noticed to be confused. Patient works at a group home. There she was thought to be disabled which is abnormal. She was also confused. Here in the emergency room patient has been found to have a UTI with elevated lactic acid and fever of 100.8. No tachycardia or hypotension. Blood pressure is low normal range. Patient feels improved at this time. History obtained from family at bedside, patient, old records and ER staff.  She has noticed foul-smelling urine with incontinence since yesterday along with chills. Took some ibuprofen. No abdominal pain or back pain. No dysuria. Had pyelonephritis in 2013. No recent antibiotic use.   VITAL SIGNS:  Blood pressure (!) 140/53, pulse 79, temperature 98.3 F (36.8 C), temperature source Oral, resp. rate 14, height 5\' 5"  (1.651 m), weight 72.3 kg (159 lb 8 oz), SpO2 100 %.  I/O:   Intake/Output Summary (Last 24 hours) at 01/12/17 0902 Last data filed at 01/12/17 0130  Gross per 24 hour  Intake              290 ml  Output                0 ml  Net              290 ml    PHYSICAL EXAMINATION:   GENERAL:   76 y.o.-year-old patient lying in the bed with no acute distress.  EYES: Pupils equal, round, reactive to light and accommodation. No scleral icterus. Extraocular muscles intact.  HEENT: Head atraumatic, normocephalic. Oropharynx and nasopharynx clear.  NECK:  Supple, no jugular venous distention. No thyroid enlargement, no tenderness.  LUNGS: Normal breath sounds bilaterally, no wheezing, rales,rhonchi or crepitation. No use of accessory muscles of respiration.  CARDIOVASCULAR: S1, S2 normal. No murmurs, rubs, or gallops.  ABDOMEN: Soft, nontender, nondistended. Bowel sounds present. No organomegaly or mass.  EXTREMITIES: No pedal edema, cyanosis, or clubbing.  NEUROLOGIC: Cranial nerves II through XII are intact. Muscle strength 5/5 in all extremities. Sensation intact. Gait not checked.  PSYCHIATRIC: The patient is alert and oriented x 3.  SKIN: No obvious rash, lesion, or ulcer.  DATA REVIEW:   CBC  Recent Labs Lab 01/12/17 0328  WBC 7.0  HGB 12.7  HCT 36.3  PLT 196    Chemistries   Recent Labs Lab 01/09/17 1725  01/12/17 0328  NA 135  < > 136  K 3.2*  < > 4.4  CL 102  < > 106  CO2 21*  < > 24  GLUCOSE 156*  < > 126*  BUN 24*  < > 17  CREATININE 1.36*  < > 1.18*  CALCIUM 9.7  < > 8.8*  AST 26  --   --   ALT 17  --   --   ALKPHOS 53  --   --   BILITOT 1.0  --   --   < > = values in this interval not displayed.  Cardiac Enzymes No results for input(s): TROPONINI in the last 168 hours.  Microbiology Results  Results for orders placed or performed during the hospital encounter of 01/09/17  Culture, blood (Routine x 2)     Status: None (Preliminary result)   Collection Time: 01/09/17  5:25 PM  Result Value Ref Range Status   Specimen Description BLOOD RIGHT ANTECUBITAL  Final   Special Requests   Final    BOTTLES DRAWN AEROBIC AND ANAEROBIC Blood Culture adequate volume   Culture NO GROWTH 3 DAYS  Final   Report Status PENDING  Incomplete  Culture, blood  (Routine x 2)     Status: None (Preliminary result)   Collection Time: 01/09/17  5:25 PM  Result Value Ref Range Status   Specimen Description BLOOD BLOOD RIGHT WRIST  Final   Special Requests   Final    BOTTLES DRAWN AEROBIC AND ANAEROBIC Blood Culture adequate volume   Culture NO GROWTH 3 DAYS  Final   Report Status PENDING  Incomplete  Urine culture     Status: Abnormal   Collection Time: 01/09/17  5:25 PM  Result Value Ref Range Status   Specimen Description URINE, RANDOM  Final   Special Requests NONE  Final   Culture >=100,000 COLONIES/mL ESCHERICHIA COLI (A)  Final   Report Status 01/12/2017 FINAL  Final   Organism ID, Bacteria ESCHERICHIA COLI (A)  Final      Susceptibility   Escherichia coli - MIC*    AMPICILLIN >=32 RESISTANT Resistant     CEFAZOLIN <=4 SENSITIVE Sensitive     CEFTRIAXONE <=1 SENSITIVE Sensitive     CIPROFLOXACIN <=0.25 SENSITIVE Sensitive     GENTAMICIN <=1 SENSITIVE Sensitive     IMIPENEM <=0.25 SENSITIVE Sensitive     NITROFURANTOIN <=16 SENSITIVE Sensitive     TRIMETH/SULFA >=320 RESISTANT Resistant     AMPICILLIN/SULBACTAM 16 INTERMEDIATE Intermediate     PIP/TAZO <=4 SENSITIVE Sensitive     Extended ESBL NEGATIVE Sensitive     * >=100,000 COLONIES/mL ESCHERICHIA COLI    RADIOLOGY:  [place] Renal  Result Date: 01/10/2017 CLINICAL DATA:  Acute renal failure. EXAM: RENAL / URINARY TRACT ULTRASOUND COMPLETE COMPARISON:  None. FINDINGS: Right Kidney: Length: 9.4 cm. Two renal cysts are present. Cysts 1 Is parapelvic, measuring 4.3 x 2.9 x 2.8 cm. Cyst 2 Is medial upper pole, 2.4 x 2.8 x 1.6 cm. Echogenicity within normal limits. No mass or hydronephrosis visualized. Left Kidney: Length: 11.8 cm. A LEFT lateral midpole cyst measures 3.6 x 2.1 x 2.1 cm. Echogenicity within normal limits. No mass or hydronephrosis visualized. Bladder: Unremarkable. IMPRESSION: No hydronephrosis or renal atrophy. Incidental renal cystic disease. Electronically Signed  By: Elsie Stain M.D.   On: 01/10/2017 10:10    EKG:   Orders placed or performed during the hospital encounter of 01/09/17  . ED EKG  . ED EKG      Management plans discussed with the patient, family and they are in agreement.  CODE STATUS:     Code Status Orders        Start     Ordered   01/09/17 1858  Full code  Continuous     01/09/17 1857    Code Status History    Date Active Date Inactive Code Status Order ID Comments User Context   This patient has a current code status but no historical code status.      TOTAL TIME TAKING CARE OF THIS PATIENT: 35 minutes.    Altamese Dilling M.D on 01/12/2017 at 9:02 AM  Between 7am to 6pm - Pager - [phone]  After 6pm go to [email or web address] - password [email or web address]  [facility] [place] [facility]  Office  [phone]  CC: Primary care physician; [facility]   Note: This dictation was prepared with Dragon dictation along with smaller phrase technology. Any transcriptional errors that result from this process are unintentional.

## 2017-01-12 NOTE — Progress Notes (Signed)
[facility]         [place], [place]   01/12/2017  Patient: Tamara PayorMartha [name]   Date of Birth:  1941-05-09  Date of admission:  01/09/2017  Date of Discharge  01/12/2017    To Whom it May Concern:   Tamara PayorMartha [name]  may return to work on 01/20/17.  PHYSICAL ACTIVITY:  Full  If you have any questions or concerns, please don't hesitate to call.  Sincerely,   Altamese DillingVACHHANI, Arish Redner M.D Pager [phone]- [phone] Office : [phone]   .

## 2017-01-12 NOTE — Care Management Important Message (Signed)
Important Message  Patient Details  Name: Tamara GunningMartha [name] Reason MRN: [id number] Date of Birth: 01/17/1941   Medicare Important Message Given:  Yes    Gwenette GreetBrenda S Barnie Sopko, RN 01/12/2017, 8:41 AM

## 2017-01-14 LAB — CULTURE, BLOOD (ROUTINE X 2)
Culture: NO GROWTH
Culture: NO GROWTH
SPECIAL REQUESTS: ADEQUATE
Special Requests: ADEQUATE

## 2017-01-29 ENCOUNTER — Encounter: Payer: Self-pay | Admitting: Urology

## 2017-01-29 ENCOUNTER — Ambulatory Visit (INDEPENDENT_AMBULATORY_CARE_PROVIDER_SITE_OTHER): Payer: Self-pay | Admitting: Urology

## 2017-01-29 VITALS — BP 112/73 | HR 71 | Ht 65.0 in | Wt 154.3 lb

## 2017-01-29 DIAGNOSIS — N3020 Other chronic cystitis without hematuria: Secondary | ICD-10-CM | POA: Diagnosis not present

## 2017-01-29 LAB — URINALYSIS, COMPLETE
Bilirubin, UA: NEGATIVE
GLUCOSE, UA: NEGATIVE
Ketones, UA: NEGATIVE
Leukocytes, UA: NEGATIVE
NITRITE UA: POSITIVE — AB
PH UA: 6 (ref 5.0–7.5)
Protein, UA: NEGATIVE
RBC, UA: NEGATIVE
Specific Gravity, UA: 1.015 (ref 1.005–1.030)
UUROB: 0.2 mg/dL (ref 0.2–1.0)

## 2017-01-29 LAB — MICROSCOPIC EXAMINATION

## 2017-01-29 MED ORDER — CIPROFLOXACIN HCL 250 MG PO TABS: 250.0000 mg | ORAL_TABLET | Freq: Two times a day (BID) | ORAL | 0 refills | Status: DC

## 2017-01-29 MED ORDER — NITROFURANTOIN MONOHYD MACRO 100 MG PO CAPS: 100.0000 mg | ORAL_CAPSULE | Freq: Every day | ORAL | 11 refills | Status: DC

## 2017-01-29 NOTE — Progress Notes (Signed)
01/29/2017 10:22 AM   Manus GunningMartha M Keetch Aug 06, 1940 [id number]  Referring provider: [facility] [address] [place], [place] [address]  Chief Complaint  Patient presents with  . Cystitis    HPI: The patient was discharged in July 2018 and had had acute encephalopathy with urinary tract infection and sepsis. Culture was positive. Her renal ultrasound in July was normal  [place] in [place] and I have had her on daily trimethoprim for 2 or 3 years. She still gets approximate 2 infections a year and her symptoms were a little bit nonspecific. [place] and there is no question that about every 2 year she can end up in the hospital with CNS symptoms and she often gets foul smelling urine. She believes her urine is a bit foul-smelling in the morning now.  She has not had a hysterectomy. She is continent. She voids every 2 or 3 hours and gets up once a night.  Modifying factors: There are no other modifying factors  Associated signs and symptoms: There are no other associated signs and symptoms Aggravating and relieving factors: There are no other aggravating or relieving factors Severity: Moderate Duration: Persistent     PMH: Past Medical History:  Diagnosis Date  . Diabetes mellitus without complication (HCC)   . Hypercholesteremia   . Hypertension     Surgical History: Past Surgical History:  Procedure Laterality Date  . EYE SURGERY      Home Medications:  Allergies as of 01/29/2017   No Known Allergies     Medication List       Accurate as of 01/29/17 10:22 AM. Always use your most recent med list.          amLODipine-valsartan 5-160 MG tablet Commonly known as:  EXFORGE Take 1 tablet by mouth daily.   aspirin 81 MG tablet Take 81 mg by mouth daily.   atenolol-chlorthalidone 100-25 MG tablet Commonly known as:  TENORETIC Take 1 tablet by mouth daily.   metFORMIN 500 MG tablet Commonly known as:   GLUCOPHAGE Take 500 mg by mouth 2 (two) times daily with a meal.   simvastatin 10 MG tablet Commonly known as:  ZOCOR Take 10 mg by mouth daily at 6 PM.   trimethoprim 100 MG tablet Commonly known as:  TRIMPEX Take 1 tablet (100 mg total) by mouth daily.   VITAMIN D-1000 MAX ST 1000 units tablet Generic drug:  Cholecalciferol Take 1,000 Units by mouth daily.       Allergies: No Known Allergies  Family History: Family History  Problem Relation Age of Onset  . Diabetes type II Brother   . Hypertension Brother   . Bladder Cancer Neg Hx   . Kidney cancer Neg Hx     Social History:  reports that she has never smoked. She has never used smokeless tobacco. She reports that she does not drink alcohol. Her drug history is not on file.  ROS: UROLOGY Frequent Urination?: No Hard to postpone urination?: No Burning/pain with urination?: No Get up at night to urinate?: Yes Leakage of urine?: No Urine stream starts and stops?: No Trouble starting stream?: No Do you have to strain to urinate?: No Blood in urine?: No Urinary tract infection?: Yes Sexually transmitted disease?: No Injury to kidneys or bladder?: No Painful intercourse?: No Weak stream?: No Currently pregnant?: No Vaginal bleeding?: No Last menstrual period?: n  Gastrointestinal Nausea?: No Vomiting?: No Indigestion/heartburn?: No Diarrhea?: Yes Constipation?: No  Constitutional Fever: Yes Night sweats?: No Weight loss?: No Fatigue?: No  Skin Skin rash/lesions?: No Itching?: No  Eyes Blurred vision?: No Double vision?: No  Ears/Nose/Throat Sore throat?: No Sinus problems?: No  Hematologic/Lymphatic Swollen glands?: No Easy bruising?: No  Cardiovascular Leg swelling?: No Chest pain?: No  Respiratory Cough?: No Shortness of breath?: No  Endocrine Excessive thirst?: No  Musculoskeletal Back pain?: No Joint pain?: No  Neurological Headaches?: No Dizziness?:  No  Psychologic Depression?: No Anxiety?: No  Physical Exam: BP 112/73 (BP Location: Left Arm, Patient Position: Sitting, Cuff Size: Normal)   Pulse 71   Ht 5\' 5"  (1.651 m)   Wt 154 lb 4.8 oz (70 kg)   BMI 25.68 kg/m   Constitutional:  Alert and oriented, No acute distress. HEENT: [place] AT, moist mucus membranes.  Trachea midline, no masses. Cardiovascular: No clubbing, cyanosis, or edema. Respiratory: Normal respiratory effort, no increased work of breathing. GI: Abdomen is soft, nontender, nondistended, no abdominal masses GU: No CVA tenderness. No bladder tenderness Skin: No rashes, bruises or suspicious lesions. Lymph: No cervical or inguinal adenopathy. Neurologic: Grossly intact, no focal deficits, moving all 4 extremities. Psychiatric: Normal mood and affect.  Laboratory Data: Lab Results  Component Value Date   WBC 7.0 01/12/2017   HGB 12.7 01/12/2017   HCT 36.3 01/12/2017   MCV 86.6 01/12/2017   PLT 196 01/12/2017    Lab Results  Component Value Date   CREATININE 1.18 (H) 01/12/2017    No results found for: PSA  No results found for: TESTOSTERONE  Lab Results  Component Value Date   HGBA1C 6.2 (H) 01/09/2017    Urinalysis    Component Value Date/Time   COLORURINE YELLOW (A) 01/09/2017 1725   APPEARANCEUR HAZY (A) 01/09/2017 1725   APPEARANCEUR Cloudy 10/21/2011 1422   LABSPEC 1.016 01/09/2017 1725   LABSPEC 1.017 10/21/2011 1422   PHURINE 5.0 01/09/2017 1725   GLUCOSEU NEGATIVE 01/09/2017 1725   GLUCOSEU Negative 10/21/2011 1422   HGBUR MODERATE (A) 01/09/2017 1725   BILIRUBINUR NEGATIVE 01/09/2017 1725   BILIRUBINUR Negative 10/21/2011 1422   KETONESUR NEGATIVE 01/09/2017 1725   PROTEINUR 30 (A) 01/09/2017 1725   NITRITE POSITIVE (A) 01/09/2017 1725   LEUKOCYTESUR SMALL (A) 01/09/2017 1725   LEUKOCYTESUR 2+ 10/21/2011 1422    Pertinent Imaging: Normal renal ultrasound  Assessment & Plan:  I thought it was best to send the urine for  culture. I would like to treat her for a possible urinary tract infection and switch her to daily Macrodantin. Based upon the last sensitivity I called and ciprofloxacin 250 mg twice a day for 1 week  1. Chronic cystitis 2. Urinary frequency   No Follow-up on file.  Martina SinnerMACDIARMID,Bayleigh Loflin A, MD  [facility] [address], [address] [place], [place] [address] [phone]

## 2017-02-01 LAB — CULTURE, URINE COMPREHENSIVE

## 2017-02-16 ENCOUNTER — Ambulatory Visit: Payer: Medicare Other | Admitting: Podiatry

## 2017-03-02 ENCOUNTER — Ambulatory Visit (INDEPENDENT_AMBULATORY_CARE_PROVIDER_SITE_OTHER): Payer: Medicare Other | Admitting: Podiatry

## 2017-03-02 DIAGNOSIS — E0842 Diabetes mellitus due to underlying condition with diabetic polyneuropathy: Secondary | ICD-10-CM

## 2017-03-02 DIAGNOSIS — B351 Tinea unguium: Secondary | ICD-10-CM

## 2017-03-02 DIAGNOSIS — M79676 Pain in unspecified toe(s): Secondary | ICD-10-CM | POA: Diagnosis not present

## 2017-03-02 DIAGNOSIS — Q828 Other specified congenital malformations of skin: Secondary | ICD-10-CM | POA: Diagnosis not present

## 2017-03-10 NOTE — Progress Notes (Signed)
   SUBJECTIVE Patient with a history of diabetes mellitus presents to office today complaining of elongated, thickened nails. Pain while ambulating in shoes. Patient is unable to trim their own nails.   Past Medical History:  Diagnosis Date  . Diabetes mellitus without complication (HCC)   . Hypercholesteremia   . Hypertension     OBJECTIVE General Patient is awake, alert, and oriented x 3 and in no acute distress. Derm Hyperkeratotic skin lesion noted to the plantar aspect of the fifth toe right foot consistent with a porokeratosis. Skin is dry and supple bilateral. Negative open lesions or macerations. Remaining integument unremarkable. Nails are tender, long, thickened and dystrophic with subungual debris, consistent with onychomycosis, 1-5 bilateral. No signs of infection noted. Vasc  DP and PT pedal pulses palpable bilaterally. Temperature gradient within normal limits.  Neuro Epicritic and protective threshold sensation diminished bilaterally.  Musculoskeletal Exam No symptomatic pedal deformities noted bilateral. Muscular strength within normal limits.  ASSESSMENT 1. Diabetes Mellitus w/ peripheral neuropathy 2. 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 Porokeratosis right fifth digit  PLAN OF CARE 1. Patient evaluated today. 2. Instructed to maintain good pedal hygiene and foot care. Stressed importance of controlling blood sugar.  3. Mechanical debridement of nails 1-5 bilaterally performed using a nail nipper. Filed with dremel without incident.  4. Excisional debridement of the porokeratosis lesion was performed using a chisel blade and tissue nipper without incident or bleeding.  5. Return to clinic in 3 mos.     Felecia ShellingBrent M. Cheron Pasquarelli, DPM [facility]  Dr. Felecia ShellingBrent M. Franshesca Chipman, DPM    [address]                                        [place], [place] [address]                Office [phone]) [phone]  Fax [phone]

## 2017-03-20 ENCOUNTER — Encounter: Payer: Self-pay | Admitting: Urology

## 2017-03-20 ENCOUNTER — Ambulatory Visit (INDEPENDENT_AMBULATORY_CARE_PROVIDER_SITE_OTHER): Payer: Medicare Other | Admitting: Urology

## 2017-03-20 VITALS — BP 117/73 | HR 65 | Ht 65.0 in | Wt 153.5 lb

## 2017-03-20 DIAGNOSIS — N3020 Other chronic cystitis without hematuria: Secondary | ICD-10-CM | POA: Diagnosis not present

## 2017-03-20 LAB — URINALYSIS, COMPLETE
Bilirubin, UA: NEGATIVE
Glucose, UA: NEGATIVE
Ketones, UA: NEGATIVE
LEUKOCYTES UA: NEGATIVE
NITRITE UA: NEGATIVE
PROTEIN UA: NEGATIVE
Specific Gravity, UA: 1.015 (ref 1.005–1.030)
Urobilinogen, Ur: 0.2 mg/dL (ref 0.2–1.0)
pH, UA: 6.5 (ref 5.0–7.5)

## 2017-03-20 NOTE — Progress Notes (Signed)
03/20/2017 10:25 AM   [name] [birth year] [id number]  Referring provider: [facility] [address] [place], [place] [address]  Chief Complaint  Patient presents with  . Cystitis    HPI: The patient was discharged in July 2018 and had had acute encephalopathy with urinary tract infection and sepsis. Culture was positive. Her renal ultrasound in July was normal  I follow the patient in [place] and I have had her on daily trimethoprim for 2 or 3 years. She still gets approximate 2 infections a year and her symptoms were a little bit nonspecific. Her daughter from [place] was here and there is no question that about every 2 year she can end up in the hospital with CNS symptoms and she often gets foul smelling urine. She believes her urine is a bit foul-smelling in the morning now.  She has not had a hysterectomy. She is continent. She voids every 2 or 3 hours and gets up once a night.    I thought it was best to send the urine for culture. I would like to treat her for a possible urinary tract infection and switch her to daily Macrodantin. Based upon the last sensitivity I called and ciprofloxacin 250 mg twice a day for 1 week  Today Frequency is stable. Last urine culture was positive again with Escherichia coli Clinically noninfected  PMH: Past Medical History:  Diagnosis Date  . Diabetes mellitus without complication (HCC)   . Hypercholesteremia   . Hypertension     Surgical History: Past Surgical History:  Procedure Laterality Date  . EYE SURGERY      Home Medications:  Allergies as of 03/20/2017      Reactions   Cefoxitin Diarrhea      Medication List       Accurate as of 03/20/17 10:25 AM. Always use your most recent med list.          amLODipine-valsartan 5-160 MG tablet Commonly known as:  EXFORGE Take 1 tablet by mouth daily.   aspirin 81 MG tablet Take 81 mg by mouth daily.   atenolol-chlorthalidone 100-25 MG tablet Commonly  known as:  TENORETIC Take 1 tablet by mouth daily.   metFORMIN 500 MG tablet Commonly known as:  GLUCOPHAGE Take 500 mg by mouth 2 (two) times daily with a meal.   nitrofurantoin (macrocrystal-monohydrate) 100 MG capsule Commonly known as:  MACROBID Take 1 capsule (100 mg total) by mouth daily.   simvastatin 10 MG tablet Commonly known as:  ZOCOR Take 10 mg by mouth daily at 6 PM.   trimethoprim 100 MG tablet Commonly known as:  TRIMPEX Take 1 tablet (100 mg total) by mouth daily.   VITAMIN D-1000 MAX ST 1000 units tablet Generic drug:  Cholecalciferol Take 1,000 Units by mouth daily.            Discharge Care Instructions        Start     Ordered   03/20/17 0000  Urinalysis, Complete     03/20/17 0943      Allergies:  Allergies  Allergen Reactions  . Cefoxitin Diarrhea    Family History: Family History  Problem Relation Age of Onset  . Diabetes type II Brother   . Hypertension Brother   . Bladder Cancer Neg Hx   . Kidney cancer Neg Hx     Social History:  reports that she has never smoked. She has never used smokeless tobacco. She reports that she does not drink alcohol.  Her drug history is not on file.  ROS: UROLOGY Frequent Urination?: No Hard to postpone urination?: No Burning/pain with urination?: No Get up at night to urinate?: Yes Leakage of urine?: No Urine stream starts and stops?: No Trouble starting stream?: No Do you have to strain to urinate?: No Blood in urine?: No Urinary tract infection?: No Sexually transmitted disease?: No Injury to kidneys or bladder?: No Painful intercourse?: No Weak stream?: No Currently pregnant?: No Vaginal bleeding?: No Last menstrual period?: n  Gastrointestinal Nausea?: No Vomiting?: No Indigestion/heartburn?: No Diarrhea?: No Constipation?: No  Constitutional Fever: No Night sweats?: No Weight loss?: No Fatigue?: No  Skin Skin rash/lesions?: No Itching?: No  Eyes Blurred vision?:  No Double vision?: No  Ears/Nose/Throat Sore throat?: No Sinus problems?: No  Hematologic/Lymphatic Swollen glands?: No Easy bruising?: No  Cardiovascular Leg swelling?: No Chest pain?: No  Respiratory Cough?: No Shortness of breath?: No  Endocrine Excessive thirst?: No  Musculoskeletal Back pain?: No Joint pain?: No  Neurological Headaches?: No Dizziness?: No  Psychologic Depression?: No Anxiety?: No  Physical Exam: BP 117/73 (BP Location: Left Arm, Patient Position: Sitting, Cuff Size: Normal)   Pulse 65   Ht  (1.651 m)   Wt 153 lb 8 oz (69.6 kg)   BMI 25.54 kg/m   Constitutional:  Alert and oriented, No acute distress.  Laboratory Data: Lab Results  Component Value Date   WBC 7.0 01/12/2017   HGB 12.7 01/12/2017   HCT 36.3 01/12/2017   MCV 86.6 01/12/2017   PLT 196 01/12/2017    Lab Results  Component Value Date   CREATININE 1.18 (H) 01/12/2017    No results found for: PSA  No results found for: TESTOSTERONE  Lab Results  Component Value Date   HGBA1C 6.2 (H) 01/09/2017    Urinalysis    Component Value Date/Time   COLORURINE YELLOW (A) 01/09/2017 1725   APPEARANCEUR Clear 01/29/2017 1004   LABSPEC 1.016 01/09/2017 1725   LABSPEC 1.017 10/21/2011 1422   PHURINE 5.0 01/09/2017 1725   GLUCOSEU Negative 01/29/2017 1004   GLUCOSEU Negative 10/21/2011 1422   HGBUR MODERATE (A) 01/09/2017 1725   BILIRUBINUR Negative 01/29/2017 1004   BILIRUBINUR Negative 10/21/2011 1422   KETONESUR NEGATIVE 01/09/2017 1725   PROTEINUR Negative 01/29/2017 1004   PROTEINUR 30 (A) 01/09/2017 1725   NITRITE Positive (A) 01/29/2017 1004   NITRITE POSITIVE (A) 01/09/2017 1725   LEUKOCYTESUR Negative 01/29/2017 1004   LEUKOCYTESUR 2+ 10/21/2011 1422    Pertinent Imaging: none  Assessment & Plan:  The patient will stay on daily Macrodantin and I will reassess her in 1 year. The role of urine cultures was discussed  1. Chronic cystitis  -  Urinalysis, Complete   No Follow-up on file.  Martina Sinner, MD  [facility] [address] [place], [place] [phone]

## 2017-03-23 LAB — CULTURE, URINE COMPREHENSIVE

## 2017-06-01 ENCOUNTER — Ambulatory Visit: Payer: Medicare Other | Admitting: Podiatry

## 2017-06-22 ENCOUNTER — Encounter: Payer: Self-pay | Admitting: Podiatry

## 2017-06-22 ENCOUNTER — Ambulatory Visit: Payer: Medicare Other | Admitting: Podiatry

## 2017-06-22 DIAGNOSIS — L989 Disorder of the skin and subcutaneous tissue, unspecified: Secondary | ICD-10-CM

## 2017-06-22 DIAGNOSIS — E0842 Diabetes mellitus due to underlying condition with diabetic polyneuropathy: Secondary | ICD-10-CM | POA: Diagnosis not present

## 2017-06-22 DIAGNOSIS — B351 Tinea unguium: Secondary | ICD-10-CM

## 2017-06-22 DIAGNOSIS — M79676 Pain in unspecified toe(s): Secondary | ICD-10-CM

## 2017-06-23 ENCOUNTER — Other Ambulatory Visit: Payer: Self-pay

## 2017-06-23 ENCOUNTER — Encounter (HOSPITAL_COMMUNITY): Payer: Self-pay | Admitting: *Deleted

## 2017-06-23 ENCOUNTER — Ambulatory Visit (HOSPITAL_COMMUNITY)
Admission: EM | Admit: 2017-06-23 | Discharge: 2017-06-23 | Disposition: A | Discharge status: Home / Self Care | Payer: Medicare Other | Attending: Family Medicine | Admitting: Family Medicine

## 2017-06-23 DIAGNOSIS — E119 Type 2 diabetes mellitus without complications: Secondary | ICD-10-CM | POA: Insufficient documentation

## 2017-06-23 DIAGNOSIS — Z566 Other physical and mental strain related to work: Secondary | ICD-10-CM

## 2017-06-23 DIAGNOSIS — R638 Other symptoms and signs concerning food and fluid intake: Secondary | ICD-10-CM | POA: Diagnosis not present

## 2017-06-23 DIAGNOSIS — Z79899 Other long term (current) drug therapy: Secondary | ICD-10-CM | POA: Insufficient documentation

## 2017-06-23 DIAGNOSIS — R630 Anorexia: Secondary | ICD-10-CM | POA: Diagnosis present

## 2017-06-23 DIAGNOSIS — G4700 Insomnia, unspecified: Secondary | ICD-10-CM | POA: Diagnosis not present

## 2017-06-23 DIAGNOSIS — I10 Essential (primary) hypertension: Secondary | ICD-10-CM | POA: Insufficient documentation

## 2017-06-23 DIAGNOSIS — Z7984 Long term (current) use of oral hypoglycemic drugs: Secondary | ICD-10-CM | POA: Diagnosis not present

## 2017-06-23 DIAGNOSIS — E7800 Pure hypercholesterolemia, unspecified: Secondary | ICD-10-CM | POA: Diagnosis not present

## 2017-06-23 DIAGNOSIS — R5383 Other fatigue: Secondary | ICD-10-CM | POA: Insufficient documentation

## 2017-06-23 DIAGNOSIS — F439 Reaction to severe stress, unspecified: Secondary | ICD-10-CM | POA: Diagnosis not present

## 2017-06-23 DIAGNOSIS — Z7982 Long term (current) use of aspirin: Secondary | ICD-10-CM | POA: Diagnosis not present

## 2017-06-23 LAB — POCT URINALYSIS DIP (DEVICE)
BILIRUBIN URINE: NEGATIVE
GLUCOSE, UA: NEGATIVE mg/dL
Ketones, ur: NEGATIVE mg/dL
LEUKOCYTES UA: NEGATIVE
NITRITE: NEGATIVE
Protein, ur: NEGATIVE mg/dL
SPECIFIC GRAVITY, URINE: 1.01 (ref 1.005–1.030)
Urobilinogen, UA: 0.2 mg/dL (ref 0.0–1.0)
pH: 6 (ref 5.0–8.0)

## 2017-06-23 NOTE — ED Triage Notes (Signed)
C/O fatigue, poor appetite without n/v x 3-4 days.  Denies pain or fevers.

## 2017-06-23 NOTE — Discharge Instructions (Signed)
Your urine is negative tonight for indications of acute urinary tract infection. I will send this for culture for confirmation, but this is reassuring as without any other urinary symptoms. Stress is likely contributing to your decreased appetite and ability to sleep. Rest, participate in activities which are enjoyable to you.  If you continue to experience these symptoms please follow up with your primary care provider as may need further evaluation for depression.  If worsening of symptoms please return to be seen or visit Ed.

## 2017-06-23 NOTE — ED Provider Notes (Signed)
[facility]    CSN: [id number] Arrival date & time: 06/23/17  1835     History   Chief Complaint Chief Complaint  Patient presents with  . Fatigue  . Anorexia    HPI [name] is a 76 y.o. female.   [place]ny Bridge[place]artha presents with her daughter with complaints of poor sleep, fatigue and decreased appetite for the past 3-4 days. Her daughter is here visiting from out of town and wanted patient to be seen prior to her departure. Patient works a part time job and is involved with her church. She has to work through the upcoming holiday. She states she has had significant increased stress at work. Daughter states she feels that patient is generally overly stressed. Without dizziness, weakness, uri symptoms, nausea, vomiting, diarrhea, confusion, urinary symptoms, chest pain, shortness of breath, or recent falls. Denies any previous similar incident, denies history of depression. Chronic cystitis, takes daily macrodantin prophylaxis. Was hospitalized in July 2018 for urosepsis, did not have any symptoms of UTI leading up to this incident. Does follow with urology, last seen 03/2017. History of type 2 diabetes and hypertension.     ROS per HPI.       Past Medical History:  Diagnosis Date  . Diabetes mellitus without complication (HCC)   . Hypercholesteremia   . Hypertension     Patient Active Problem List   Diagnosis Date Noted  . UTI (urinary tract infection) 01/09/2017    Past Surgical History:  Procedure Laterality Date  . EYE SURGERY      OB History    Gravida Para Term Preterm AB Living   2         2   SAB TAB Ectopic Multiple Live Births                   Home Medications    Prior to Admission medications   Medication Sig Start Date End Date Taking? Authorizing Provider  aspirin 81 MG tablet Take 81 mg by mouth daily.   Yes [provider]  atenolol (TENORMIN) 25 MG tablet Take by mouth.   Yes [provider]  bifidobacterium  infantis (ALIGN) capsule Take by mouth.   Yes [provider]  Cholecalciferol (VITAMIN D-1000 MAX ST) 1000 units tablet Take 1,000 Units by mouth daily.    Yes [provider]  metFORMIN (GLUCOPHAGE) 500 MG tablet Take 500 mg by mouth 2 (two) times daily with a meal.   Yes [provider]  simvastatin (ZOCOR) 10 MG tablet Take 10 mg by mouth daily at 6 PM.  07/26/15  Yes [provider]  UNKNOWN TO PATIENT Another HTN combo med   Yes [provider]    Family History Family History  Problem Relation Age of Onset  . Diabetes type II Brother   . Hypertension Brother   . Bladder Cancer Neg Hx   . Kidney cancer Neg Hx     Social History Social History   Tobacco Use  . Smoking status: Never Smoker  . Smokeless tobacco: Never Used  Substance Use Topics  . Alcohol use: No  . Drug use: Not on file     Allergies   Cefoxitin   Review of Systems Review of Systems   Physical Exam Triage Vital Signs ED Triage Vitals [06/23/17 1845]  Enc Vitals Group     BP (!) 144/69     Pulse Rate 61     Resp 18  Temp 97.6 F (36.4 C)     Temp Source Oral     SpO2 99 %     Weight      Height      Head Circumference      Peak Flow      Pain Score      Pain Loc      Pain Edu?      Excl. in GC?    No data found.  Updated Vital Signs BP (!) 144/69   Pulse 61   Temp 97.6 F (36.4 C) (Oral)   Resp 18   SpO2 99%   Visual Acuity Right Eye Distance:   Left Eye Distance:   Bilateral Distance:    Right Eye Near:   Left Eye Near:    Bilateral Near:     Physical Exam  Constitutional: She is oriented to person, place, and time. She appears well-developed and well-nourished. No distress.  HENT:  Head: Normocephalic.  Right Ear: External ear normal.  Left Ear: External ear normal.  Nose: Nose normal.  Mouth/Throat: Oropharynx is clear and moist.  Eyes: Conjunctivae and EOM are normal. Pupils are equal, round, and reactive to light.   Neck: Normal range of motion. Neck supple.  Cardiovascular: Normal rate, regular rhythm and normal heart sounds.  Pulmonary/Chest: Effort normal and breath sounds normal. No respiratory distress.  Abdominal: Soft. There is no tenderness.  Musculoskeletal: Normal range of motion.  Neurological: She is alert and oriented to person, place, and time. No cranial nerve deficit or sensory deficit. Coordination normal.  Skin: Skin is warm and dry.  Psychiatric: Her speech is normal and behavior is normal. Thought content normal.  Somewhat flat affect, somewhat depressed in appearance.      UC Treatments / Results  Labs (all labs ordered are listed, but only abnormal results are displayed) Labs Reviewed  POCT URINALYSIS DIP (DEVICE) - Abnormal; Notable for the following components:      Result Value   Hgb urine dipstick TRACE (*)    All other components within normal limits  URINE CULTURE    EKG  EKG Interpretation None       Radiology No results found.  Procedures Procedures (including critical care time)  Medications Ordered in UC Medications - No data to display   Initial Impression / Assessment and Plan / UC Course  I have reviewed the triage vital signs and the nursing notes.  Pertinent labs & imaging results that were available during my care of the patient were reviewed by me and considered in my medical decision making (see chart for details).     Urine negative tonight for acute infection. Without any physical findings, consistent with history. Consistent with stress vs depression at this time. Discussed this at length with patient and daughter. Return precautions provided in relation to possible medical issue that is not otherwise presenting tonight. Recommended follow up with PCP for recheck and screen for depression. Patient verbalized understanding and agreeable to plan.  Ambulatory out of clinic without difficulty.    Final Clinical Impressions(s) / UC  Diagnoses   Final diagnoses:  Stress at work  Fatigue, unspecified type    ED Discharge Orders    None       Controlled Substance Prescriptions [place] Controlled Substance Registry consulted? Not Applicable   Georgetta HaberBurky, Clair Alfieri B, NP 06/23/17 1925

## 2017-06-25 LAB — URINE CULTURE: Culture: NO GROWTH

## 2017-06-28 NOTE — Progress Notes (Signed)
    Subjective: Patient is a 76 y.o. female presenting to the office today with a chief complaint of a painful callus lesion to the right fifth toe that has been present for several weeks.  Patient also complains of elongated, thickened nails that cause pain while ambulating in shoes. Patient is unable to trim their own nails. Patient presents today for further treatment and evaluation.  Past Medical History:  Diagnosis Date  . Diabetes mellitus without complication (HCC)   . Hypercholesteremia   . Hypertension     Objective:  Physical Exam General: Alert and oriented x3 in no acute distress  Dermatology: Hyperkeratotic lesion present on the right fifth toe. Pain on palpation with a central nucleated core noted. Skin is warm, dry and supple bilateral lower extremities. Negative for open lesions or macerations. Nails are tender, long, thickened and dystrophic with subungual debris, consistent with onychomycosis, 1-5 bilateral. No signs of infection noted.  Vascular: Palpable pedal pulses bilaterally. No edema or erythema noted. Capillary refill within normal limits.  Neurological: Epicritic and protective threshold grossly intact bilaterally.   Musculoskeletal Exam: Pain on palpation at the keratotic lesion noted. Range of motion within normal limits bilateral. Muscle strength 5/5 in all groups bilateral.  Assessment: 1. Onychodystrophic nails 1-5 bilateral with hyperkeratosis of nails.  2. 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  Porokeratosis to the right fifth toe   Plan of Care:  #1 Patient evaluated. #2 Excisional debridement of keratoic lesion using a chisel blade was performed without incident.  #3 Dressed with light dressing. #4 Mechanical debridement of nails 1-5 bilaterally performed using a nail nipper. Filed with dremel without incident.  #5 Patient is to return to the clinic in 3 months.   Felecia ShellingBrent M. Palma Buster, DPM [facility]  Dr. Felecia ShellingBrent M.  Andrena Margerum, DPM    [address]                                        [place], [place] [address]                Office [phone]  Fax [phone]

## 2017-06-29 ENCOUNTER — Encounter (HOSPITAL_COMMUNITY): Payer: Self-pay | Admitting: Emergency Medicine

## 2017-06-29 ENCOUNTER — Ambulatory Visit (HOSPITAL_COMMUNITY)
Admission: EM | Admit: 2017-06-29 | Discharge: 2017-06-29 | Disposition: A | Discharge status: Home / Self Care | Payer: Medicare Other | Attending: Internal Medicine | Admitting: Internal Medicine

## 2017-06-29 DIAGNOSIS — Z7689 Persons encountering health services in other specified circumstances: Secondary | ICD-10-CM | POA: Diagnosis not present

## 2017-06-29 DIAGNOSIS — Z0289 Encounter for other administrative examinations: Secondary | ICD-10-CM

## 2017-06-29 NOTE — ED Triage Notes (Signed)
PT was written out of work for fatigue and stress. PT needs a note to return to work.

## 2017-06-29 NOTE — ED Provider Notes (Signed)
[facility]    CSN: [id number] Arrival date & time: 06/29/17  1439     History   Chief Complaint Chief Complaint  Patient presents with  . Follow-up    HPI Tamara [name] is a 76 y.o. female.   76 year old female, presenting today for follow-up.  States that she was seen here several days ago for fatigue and stress and was written out of work.  States that she wanted to go back to work today but they told her that she had to have a work note.  She states that all of her symptoms have improved.  She states that she is eating, drinking and sleeping better.  Feels much better and is ready to go back to work.   The history is provided by the patient.  Illness  Location:  Generalized Quality:  Resolved Severity:  Mild Onset quality:  Gradual Duration:  1 week Timing:  Constant Progression:  Resolved Chronicity:  New Relieved by:  Rest Associated symptoms: no abdominal pain, no chest pain, no congestion, no cough, no diarrhea, no ear pain, no fatigue, no fever, no headaches, no loss of consciousness, no myalgias, no nausea, no rash, no shortness of breath, no sore throat and no vomiting     Past Medical History:  Diagnosis Date  . Diabetes mellitus without complication (HCC)   . Hypercholesteremia   . Hypertension     Patient Active Problem List   Diagnosis Date Noted  . UTI (urinary tract infection) 01/09/2017    Past Surgical History:  Procedure Laterality Date  . EYE SURGERY      OB History    Gravida Para Term Preterm AB Living   2         2   SAB TAB Ectopic Multiple Live Births                   Home Medications    Prior to Admission medications   Medication Sig Start Date End Date Taking? Authorizing Provider  aspirin 81 MG tablet Take 81 mg by mouth daily.   Yes [provider]  atenolol (TENORMIN) 25 MG tablet Take by mouth.   Yes [provider]  bifidobacterium infantis (ALIGN) capsule Take by mouth.   Yes  [provider]  Cholecalciferol (VITAMIN D-1000 MAX ST) 1000 units tablet Take 1,000 Units by mouth daily.    Yes [provider]  metFORMIN (GLUCOPHAGE) 500 MG tablet Take 500 mg by mouth daily with breakfast.    Yes [provider]  simvastatin (ZOCOR) 10 MG tablet Take 10 mg by mouth daily at 6 PM.  07/26/15  Yes [provider]  UNKNOWN TO PATIENT Another HTN combo med    [provider]    Family History Family History  Problem Relation Age of Onset  . Diabetes type II Brother   . Hypertension Brother   . Bladder Cancer Neg Hx   . Kidney cancer Neg Hx     Social History Social History   Tobacco Use  . Smoking status: Never Smoker  . Smokeless tobacco: Never Used  Substance Use Topics  . Alcohol use: No  . Drug use: Not on file     Allergies   Cefoxitin   Review of Systems Review of Systems  Constitutional: Negative for chills, fatigue and fever.  HENT: Negative for congestion, ear pain and sore throat.   Eyes: Negative for pain and visual disturbance.  Respiratory: Negative for cough and  shortness of breath.   Cardiovascular: Negative for chest pain and palpitations.  Gastrointestinal: Negative for abdominal pain, diarrhea, nausea and vomiting.  Genitourinary: Negative for dysuria and hematuria.  Musculoskeletal: Negative for arthralgias, back pain and myalgias.  Skin: Negative for color change and rash.  Neurological: Negative for seizures, loss of consciousness, syncope and headaches.  All other systems reviewed and are negative.    Physical Exam Triage Vital Signs ED Triage Vitals  Enc Vitals Group     BP 06/29/17 1455 138/74     Pulse Rate 06/29/17 1454 (!) 57     Resp 06/29/17 1454 16     Temp 06/29/17 1454 97.8 F (36.6 C)     Temp Source 06/29/17 1454 Oral     SpO2 06/29/17 1454 98 %     Weight 06/29/17 1454 153 lb (69.4 kg)     Height 06/29/17 1454 5\' 5"  (1.651 m)     Head Circumference --       Peak Flow --      Pain Score 06/29/17 1455 0     Pain Loc --      Pain Edu? --      Excl. in GC? --    No data found.  Updated Vital Signs BP 138/74   Pulse (!) 57   Temp 97.8 F (36.6 C) (Oral)   Resp 16   Ht 5\' 5"  (1.651 m)   Wt 153 lb (69.4 kg)   SpO2 98%   BMI 25.46 kg/m   Visual Acuity Right Eye Distance:   Left Eye Distance:   Bilateral Distance:    Right Eye Near:   Left Eye Near:    Bilateral Near:     Physical Exam  Constitutional: She appears well-developed and well-nourished. No distress.  HENT:  Head: Normocephalic and atraumatic.  Right Ear: Hearing, tympanic membrane, external ear and ear canal normal.  Left Ear: Hearing, tympanic membrane, external ear and ear canal normal.  Nose: Nose normal.  Mouth/Throat: Oropharynx is clear and moist. No oropharyngeal exudate, posterior oropharyngeal edema, posterior oropharyngeal erythema or tonsillar abscesses.  Eyes: Conjunctivae are normal.  Neck: Neck supple.  Cardiovascular: Normal rate and regular rhythm.  No murmur heard. Pulmonary/Chest: Effort normal and breath sounds normal. No stridor. No respiratory distress. She has no wheezes. She has no rhonchi. She has no rales.  Abdominal: Soft. There is no tenderness.  Musculoskeletal: She exhibits no edema.  Neurological: She is alert.  Skin: Skin is warm and dry.  Psychiatric: She has a normal mood and affect.  Nursing note and vitals reviewed.    UC Treatments / Results  Labs (all labs ordered are listed, but only abnormal results are displayed) Labs Reviewed - No data to display  EKG  EKG Interpretation None       Radiology No results found.  Procedures Procedures (including critical care time)  Medications Ordered in UC Medications - No data to display   Initial Impression / Assessment and Plan / UC Course  I have reviewed the triage vital signs and the nursing notes.  Pertinent labs & imaging results that were available during my  care of the patient were reviewed by me and considered in my medical decision making (see chart for details).     Patient here today requesting a note to return to work.  She states that all of her symptoms have resolved.  Final Clinical Impressions(s) / UC Diagnoses   Final diagnoses:  Return to work evaluation  ED Discharge Orders    None       Controlled Substance Prescriptions [place] Controlled Substance Registry consulted? Not Applicable   Alecia LemmingBlue, Rydell Wiegel C, [place]PA-C 06/29/17 [id number]

## 2017-06-29 NOTE — Discharge Instructions (Signed)
You may return to work.

## 2017-07-10 ENCOUNTER — Other Ambulatory Visit: Payer: Self-pay | Admitting: Internal Medicine

## 2017-07-10 DIAGNOSIS — Z1231 Encounter for screening mammogram for malignant neoplasm of breast: Secondary | ICD-10-CM

## 2017-08-08 ENCOUNTER — Ambulatory Visit
Admission: RE | Admit: 2017-08-08 | Discharge: 2017-08-08 | Disposition: A | Discharge status: Home / Self Care | Payer: Medicare Other | Source: Ambulatory Visit | Attending: Internal Medicine | Admitting: Internal Medicine

## 2017-08-08 DIAGNOSIS — Z1231 Encounter for screening mammogram for malignant neoplasm of breast: Secondary | ICD-10-CM | POA: Diagnosis present

## 2017-08-10 ENCOUNTER — Encounter: Payer: Self-pay | Admitting: Obstetrics and Gynecology

## 2017-08-10 ENCOUNTER — Other Ambulatory Visit: Payer: Self-pay

## 2017-08-10 ENCOUNTER — Ambulatory Visit (INDEPENDENT_AMBULATORY_CARE_PROVIDER_SITE_OTHER): Payer: Medicare Other | Admitting: Obstetrics and Gynecology

## 2017-08-10 VITALS — BP 112/72 | HR 76 | Ht 65.0 in | Wt 158.0 lb

## 2017-08-10 DIAGNOSIS — L739 Follicular disorder, unspecified: Secondary | ICD-10-CM | POA: Diagnosis not present

## 2017-08-10 NOTE — Patient Instructions (Signed)
I value your feedback and entrusting us with your care. If you get a [facility] patient survey, I would appreciate you taking the time to let us know about your experience today. Thank you! 

## 2017-08-10 NOTE — Progress Notes (Signed)
Chief Complaint  Patient presents with  . Gynecologic Exam    Non painful bump left    HPI:      [name] is a 77 y.o. G2P0 who LMP was No LMP recorded. Patient is postmenopausal., presents today for small bump on buttocks for the past wk. She noticed it with washing. Bump is not tender or painful. No change in size since first noted. No d/c. No vag sx. Never had before.   Past Medical History:  Diagnosis Date  . Diabetes mellitus without complication (HCC)   . Hypercholesteremia   . Hypertension     Past Surgical History:  Procedure Laterality Date  . EYE SURGERY      Family History  Problem Relation Age of Onset  . Diabetes type II Brother   . Hypertension Brother   . Bladder Cancer Neg Hx   . Kidney cancer Neg Hx   . Breast cancer Neg Hx     Social History   Socioeconomic History  . Marital status: Widowed    Spouse name: Not on file  . Number of children: Not on file  . Years of education: Not on file  . Highest education level: Not on file  Social Needs  . Financial resource strain: Not on file  . Food insecurity - worry: Not on file  . Food insecurity - inability: Not on file  . Transportation needs - medical: Not on file  . Transportation needs - non-medical: Not on file  Occupational History  . Not on file  Tobacco Use  . Smoking status: Never Smoker  . Smokeless tobacco: Never Used  Substance and Sexual Activity  . Alcohol use: No  . Drug use: Not on file  . Sexual activity: Not on file  Other Topics Concern  . Not on file  Social History Narrative  . Not on file     Current Outpatient Medications:  .  amLODipine-olmesartan (AZOR) 10-40 MG tablet, , Disp: , Rfl:  .  aspirin 81 MG tablet, Take 81 mg by mouth daily., Disp: , Rfl:  .  atenolol (TENORMIN) 25 MG tablet, Take by mouth., Disp: , Rfl:  .  bifidobacterium infantis (ALIGN) capsule, Take by mouth., Disp: , Rfl:  .  Cholecalciferol (VITAMIN D-1000 MAX ST) 1000 units  tablet, Take 1,000 Units by mouth daily. , Disp: , Rfl:  .  metFORMIN (GLUCOPHAGE) 500 MG tablet, Take 500 mg by mouth daily with breakfast. , Disp: , Rfl:  .  nitrofurantoin, macrocrystal-monohydrate, (MACROBID) 100 MG capsule, , Disp: , Rfl:  .  simvastatin (ZOCOR) 10 MG tablet, Take 10 mg by mouth daily at 6 PM. , Disp: , Rfl:  .  UNKNOWN TO PATIENT, Another HTN combo med, Disp: , Rfl:    ROS:  Review of Systems  Constitutional: Negative for fever.  Gastrointestinal: Negative for blood in stool, constipation, diarrhea, nausea and vomiting.  Genitourinary: Negative for dyspareunia, dysuria, flank pain, frequency, hematuria, urgency, vaginal bleeding, vaginal discharge and vaginal pain.  Musculoskeletal: Negative for back pain.  Skin: Negative for rash.     OBJECTIVE:   Vitals:  BP 112/72 (BP Location: Left Arm, Patient Position: Sitting, Cuff Size: Normal)   Pulse 76   Ht 5\' 5"  (1.651 m)   Wt 158 lb (71.7 kg)   BMI 26.29 kg/m   Physical Exam  Constitutional: She is oriented to person, place, and time and well-developed, well-nourished, and in no distress.  Genitourinary:  Genitourinary Comments: LT  BUTTOCKS NEAR GLUTEAL CLEFT WITH ~2 MM FIRM, MOBILE, NT SUPERFICIAL MASS; NO ERYTHEMA, D/C, PAPULE/PUSTULE;   Neurological: She is alert and oriented to person, place, and time.  Psychiatric: Memory, affect and judgment normal.  Vitals reviewed.  Assessment/Plan: Sebaceous gland disease - Small blocked gland LT buttocks. Reassurance. Warm compresses/leave alone. If sx worsen/change, can f/u with gen surg for exc.    Return if symptoms worsen or fail to improve.  Myrel Rappleye B. Annina Piotrowski, PA-C 08/10/2017 10:52 AM

## 2017-09-14 ENCOUNTER — Encounter: Payer: Self-pay | Admitting: Podiatry

## 2017-09-14 ENCOUNTER — Ambulatory Visit: Payer: Medicare Other | Admitting: Podiatry

## 2017-09-14 DIAGNOSIS — E0842 Diabetes mellitus due to underlying condition with diabetic polyneuropathy: Secondary | ICD-10-CM

## 2017-09-14 DIAGNOSIS — M79676 Pain in unspecified toe(s): Secondary | ICD-10-CM | POA: Diagnosis not present

## 2017-09-14 DIAGNOSIS — B351 Tinea unguium: Secondary | ICD-10-CM

## 2017-09-14 NOTE — Progress Notes (Signed)
   SUBJECTIVE Patient with a history of diabetes mellitus presents to office today complaining of elongated, thickened nails that cause pain while ambulating in shoes. She is unable to trim her own nails. Patient is here for further evaluation and treatment.   Past Medical History:  Diagnosis Date  . Diabetes mellitus without complication (HCC)   . Hypercholesteremia   . Hypertension     OBJECTIVE General Patient is awake, alert, and oriented x 3 and in no acute distress. Derm Skin is dry and supple bilateral. Negative open lesions or macerations. Remaining integument unremarkable. Nails are tender, long, thickened and dystrophic with subungual debris, consistent with onychomycosis, 1-5 bilateral. No signs of infection noted. Vasc  DP and PT pedal pulses palpable bilaterally. Temperature gradient within normal limits.  Neuro Epicritic and protective threshold sensation diminished bilaterally.  Musculoskeletal Exam No symptomatic pedal deformities noted bilateral. Muscular strength within normal limits.  ASSESSMENT 1. Diabetes Mellitus w/ peripheral neuropathy 2. 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 Pain in foot bilateral  PLAN OF CARE 1. Patient evaluated today. 2. Instructed to maintain good pedal hygiene and foot care. Stressed importance of controlling blood sugar.  3. Mechanical debridement of nails 1-5 bilaterally performed using a nail nipper. Filed with dremel without incident.  4. Return to clinic in 3 mos.     Felecia ShellingBrent M. Evans, DPM [facility]  Dr. Felecia ShellingBrent M. Evans, DPM    [address]                                        [place], [place] [address]                Office [phone]  Fax [phone]

## 2017-09-21 ENCOUNTER — Ambulatory Visit: Payer: Medicare Other | Admitting: Podiatry

## 2017-10-10 ENCOUNTER — Encounter: Payer: Self-pay | Admitting: Obstetrics and Gynecology

## 2017-10-10 ENCOUNTER — Ambulatory Visit (INDEPENDENT_AMBULATORY_CARE_PROVIDER_SITE_OTHER): Payer: Medicare Other | Admitting: Obstetrics and Gynecology

## 2017-10-10 VITALS — BP 138/74 | HR 62 | Ht 65.0 in | Wt 159.0 lb

## 2017-10-10 DIAGNOSIS — R195 Other fecal abnormalities: Secondary | ICD-10-CM | POA: Diagnosis not present

## 2017-10-10 LAB — HEMOCCULT GUIAC POC 1CARD (OFFICE): Fecal Occult Blood, POC: NEGATIVE

## 2017-10-10 NOTE — Progress Notes (Signed)
[facility]   Chief Complaint  Patient presents with  . Follow-up    Pt states it is the same    HPI:      Tamara [name] is a 77 y.o. G2P0 who LMP was No LMP recorded. Patient is postmenopausal., presents today for green BMs last wk for about 4 days in row. No pelvic or abd pain. No blood in stool or with wiping. Has a hx of constipation. Was eating lots of turnip greens before sx started. Neg colonoscopy 7 yrs ago, due again in 10 yrs. No fevers, n/v/d. No new vitamins.   Past Medical History:  Diagnosis Date  . Diabetes mellitus without complication (HCC)   . Hypercholesteremia   . Hypertension     Past Surgical History:  Procedure Laterality Date  . EYE SURGERY      Family History  Problem Relation Age of Onset  . Diabetes type II Brother   . Hypertension Brother   . Bladder Cancer Neg Hx   . Kidney cancer Neg Hx   . Breast cancer Neg Hx     Social History   Socioeconomic History  . Marital status: Widowed    Spouse name: Not on file  . Number of children: Not on file  . Years of education: Not on file  . Highest education level: Not on file  Occupational History  . Not on file  Social Needs  . Financial resource strain: Not on file  . Food insecurity:    Worry: Not on file    Inability: Not on file  . Transportation needs:    Medical: Not on file    Non-medical: Not on file  Tobacco Use  . Smoking status: Never Smoker  . Smokeless tobacco: Never Used  Substance and Sexual Activity  . Alcohol use: No  . Drug use: Never  . Sexual activity: Not Currently    Birth control/protection: Post-menopausal  Lifestyle  . Physical activity:    Days per week: Not on file    Minutes per session: Not on file  . Stress: Not on file  Relationships  . Social connections:    Talks on phone: Not on file    Gets together: Not on file    Attends religious service: Not on file    Active member of club or organization: Not on file    Attends  meetings of clubs or organizations: Not on file    Relationship status: Not on file  . Intimate partner violence:    Fear of current or ex partner: Not on file    Emotionally abused: Not on file    Physically abused: Not on file    Forced sexual activity: Not on file  Other Topics Concern  . Not on file  Social History Narrative  . Not on file    Outpatient Medications Prior to Visit  Medication Sig Dispense Refill  . amLODipine-olmesartan (AZOR) 10-40 MG tablet     . aspirin 81 MG tablet Take 81 mg by mouth daily.    Marland Kitchen. atenolol (TENORMIN) 25 MG tablet Take by mouth.    . bifidobacterium infantis (ALIGN) capsule Take by mouth.    . Cholecalciferol (VITAMIN D-1000 MAX ST) 1000 units tablet Take 1,000 Units by mouth daily.     . metFORMIN (GLUCOPHAGE) 500 MG tablet Take 500 mg by mouth daily with breakfast.     . nitrofurantoin, macrocrystal-monohydrate, (MACROBID) 100 MG capsule     . simvastatin (  ZOCOR) 10 MG tablet Take 10 mg by mouth daily at 6 PM.     . UNKNOWN TO PATIENT Another HTN combo med     No facility-administered medications prior to visit.      ROS:  Review of Systems  Constitutional: Negative for fever.  Gastrointestinal: Positive for constipation. Negative for blood in stool, diarrhea, nausea and vomiting.  Genitourinary: Negative for dyspareunia, dysuria, flank pain, frequency, hematuria, urgency, vaginal bleeding, vaginal discharge and vaginal pain.  Musculoskeletal: Negative for back pain.  Skin: Negative for rash.   BREAST: No symptoms   OBJECTIVE:   Vitals:  BP 138/74   Pulse 62   Ht 5\' 5"  (1.651 m)   Wt 159 lb (72.1 kg)   BMI 26.46 kg/m   Physical Exam  Constitutional: She is oriented to person, place, and time. She appears well-developed.  Neck: Normal range of motion.  Pulmonary/Chest: Effort normal.  Abdominal: Soft.  Genitourinary: Rectum normal, vagina normal and uterus normal. Rectal exam shows no fissure, no mass and guaiac negative  stool. There is no rash, tenderness or lesion on the right labia. There is no rash, tenderness or lesion on the left labia. Uterus is not tender. Cervix exhibits no discharge. Right adnexum displays no mass and no tenderness. Left adnexum displays no mass and no tenderness. No erythema or bleeding in the vagina.  Neurological: She is alert and oriented to person, place, and time.  Psychiatric: She has a normal mood and affect. Her behavior is normal. Judgment and thought content normal.  Vitals reviewed.   Results: Results for orders placed or performed in visit on 10/10/17 (from the past 24 hour(s))  POCT Occult Blood Stool     Status: Normal   Collection Time: 10/10/17  4:52 PM  Result Value Ref Range   Fecal Occult Blood, POC Negative Negative   Card #1 Date     Card #2 Fecal Occult Blod, POC     Card #2 Date     Card #3 Fecal Occult Blood, POC     Card #3 Date       Assessment/Plan: Green stool - Neg FOBT/neg exam. Most likely due to eating greens before sx started. Sx resolved. F/u with PCP if sx recur. Reassurance. - Plan: POCT Occult Blood Stool    Return if symptoms worsen or fail to improve.  Zalea Pete B. Annastyn Silvey, PA-C 10/10/2017 4:52 PM

## 2017-10-10 NOTE — Patient Instructions (Signed)
I value your feedback and entrusting us with your care. If you get a [facility] patient survey, I would appreciate you taking the time to let us know about your experience today. Thank you! 

## 2017-12-18 ENCOUNTER — Ambulatory Visit: Payer: Medicare Other | Admitting: Podiatry

## 2017-12-25 ENCOUNTER — Encounter: Payer: Self-pay | Admitting: Podiatry

## 2017-12-25 ENCOUNTER — Ambulatory Visit: Payer: Medicare Other | Admitting: Podiatry

## 2017-12-25 DIAGNOSIS — E0842 Diabetes mellitus due to underlying condition with diabetic polyneuropathy: Secondary | ICD-10-CM | POA: Diagnosis not present

## 2017-12-25 DIAGNOSIS — M79676 Pain in unspecified toe(s): Secondary | ICD-10-CM

## 2017-12-25 DIAGNOSIS — B351 Tinea unguium: Secondary | ICD-10-CM | POA: Diagnosis not present

## 2017-12-27 NOTE — Progress Notes (Signed)
   SUBJECTIVE Patient with a history of diabetes mellitus presents to office today complaining of elongated, thickened nails that cause pain while ambulating in shoes. She is unable to trim her own nails. Patient is here for further evaluation and treatment.   Past Medical History:  Diagnosis Date  . Diabetes mellitus without complication (HCC)   . Hypercholesteremia   . Hypertension     OBJECTIVE General Patient is awake, alert, and oriented x 3 and in no acute distress. Derm Skin is dry and supple bilateral. Negative open lesions or macerations. Remaining integument unremarkable. Nails are tender, long, thickened and dystrophic with subungual debris, consistent with onychomycosis, 1-5 bilateral. No signs of infection noted. Vasc  DP and PT pedal pulses palpable bilaterally. Temperature gradient within normal limits.  Neuro Epicritic and protective threshold sensation diminished bilaterally.  Musculoskeletal Exam No symptomatic pedal deformities noted bilateral. Muscular strength within normal limits.  ASSESSMENT 1. Diabetes Mellitus w/ peripheral neuropathy 2. 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 Pain in foot bilateral  PLAN OF CARE 1. Patient evaluated today. 2. Instructed to maintain good pedal hygiene and foot care. Stressed importance of controlling blood sugar.  3. Mechanical debridement of nails 1-5 bilaterally performed using a nail nipper. Filed with dremel without incident.  4. Return to clinic in 3 mos.     Felecia ShellingBrent M. Evans, DPM [facility]  Dr. Felecia ShellingBrent M. Evans, DPM    [address]                                        [place], [place] [address]                Office [phone]  Fax [phone]

## 2018-01-12 ENCOUNTER — Emergency Department
Admission: EM | Admit: 2018-01-12 | Discharge: 2018-01-12 | Disposition: A | Discharge status: Home / Self Care | Payer: Medicare Other | Attending: Emergency Medicine | Admitting: Emergency Medicine

## 2018-01-12 ENCOUNTER — Other Ambulatory Visit: Payer: Self-pay

## 2018-01-12 DIAGNOSIS — I10 Essential (primary) hypertension: Secondary | ICD-10-CM | POA: Insufficient documentation

## 2018-01-12 DIAGNOSIS — E119 Type 2 diabetes mellitus without complications: Secondary | ICD-10-CM | POA: Diagnosis not present

## 2018-01-12 DIAGNOSIS — M545 Low back pain: Secondary | ICD-10-CM | POA: Diagnosis present

## 2018-01-12 DIAGNOSIS — Z79899 Other long term (current) drug therapy: Secondary | ICD-10-CM | POA: Diagnosis not present

## 2018-01-12 DIAGNOSIS — M5441 Lumbago with sciatica, right side: Secondary | ICD-10-CM | POA: Diagnosis not present

## 2018-01-12 DIAGNOSIS — Z7982 Long term (current) use of aspirin: Secondary | ICD-10-CM | POA: Insufficient documentation

## 2018-01-12 DIAGNOSIS — Z7984 Long term (current) use of oral hypoglycemic drugs: Secondary | ICD-10-CM | POA: Diagnosis not present

## 2018-01-12 MED ORDER — TRAMADOL HCL 50 MG PO TABS: 50.0000 mg | ORAL_TABLET | Freq: Three times a day (TID) | ORAL | 0 refills | Status: DC | PRN

## 2018-01-12 MED ORDER — DIAZEPAM 2 MG PO TABS: ORAL_TABLET | ORAL | 0 refills | Status: DC

## 2018-01-12 NOTE — ED Provider Notes (Signed)
[facility] Emergency Department Provider Note  ____________________________________________   First MD Initiated Contact with Patient 01/12/18 [phone]     (approximate)  I have reviewed the triage vital signs and the nursing notes.   HISTORY  Chief Complaint Back Pain  HPI Tamara [name] is a 77 y.o. female is here with complaint of low back pain for the last 3 days.  Patient denies any urinary symptoms or history of kidney stones.  She is unaware of any injury.  She continues to ambulate without any assistance.  She denies any incontinence of bowel or bladder or saddle anesthesias.  She describes her pain as starting in the right hip area with radiation down her right leg to her knee.  Patient denies any previous radiculopathy.  Currently she rates her pain as 7 out of 10.   Past Medical History:  Diagnosis Date  . Diabetes mellitus without complication (HCC)   . Hypercholesteremia   . Hypertension     Patient Active Problem List   Diagnosis Date Noted  . UTI (urinary tract infection) 01/09/2017    Past Surgical History:  Procedure Laterality Date  . EYE SURGERY      Prior to Admission medications   Medication Sig Start Date End Date Taking? Authorizing Provider  amLODipine-olmesartan (AZOR) 10-40 MG tablet  08/02/17   [provider]  aspirin 81 MG tablet Take 81 mg by mouth daily.    [provider]  atenolol (TENORMIN) 25 MG tablet Take by mouth.    [provider]  bifidobacterium infantis (ALIGN) capsule Take by mouth.    [provider]  Cholecalciferol (VITAMIN D-1000 MAX ST) 1000 units tablet Take 1,000 Units by mouth daily.     [provider]  diazepam (VALIUM) 2 MG tablet 1 tablet every 8 hours as needed for muscles. 01/12/18   Tommi Rumps, PA-C  metFORMIN (GLUCOPHAGE) 500 MG tablet Take 500 mg by mouth daily with breakfast.     [provider]  nitrofurantoin,  macrocrystal-monohydrate, (MACROBID) 100 MG capsule  07/31/17   [provider]  simvastatin (ZOCOR) 10 MG tablet Take 10 mg by mouth daily at 6 PM.  07/26/15   [provider]  traMADol (ULTRAM) 50 MG tablet Take 1 tablet (50 mg total) by mouth every 8 (eight) hours as needed for moderate pain. 01/12/18   Tommi Rumps, PA-C  UNKNOWN TO PATIENT Another HTN combo med    [provider]    Allergies Cefoxitin  Family History  Problem Relation Age of Onset  . Diabetes type II Brother   . Hypertension Brother   . Bladder Cancer Neg Hx   . Kidney cancer Neg Hx   . Breast cancer Neg Hx     Social History Social History   Tobacco Use  . Smoking status: Never Smoker  . Smokeless tobacco: Never Used  Substance Use Topics  . Alcohol use: No  . Drug use: Never    Review of Systems Constitutional: No fever/chills Cardiovascular: Denies chest pain. Respiratory: Denies shortness of breath. Gastrointestinal: No abdominal pain.  No nausea, no vomiting.   Genitourinary: Negative for dysuria. Musculoskeletal: Positive for low back pain.  Positive for right leg radiculopathy. Skin: Negative for rash. Neurological: Negative for headaches, focal weakness or numbness. ___________________________________________   PHYSICAL EXAM:  VITAL SIGNS: ED Triage Vitals [01/12/18 0853]  Enc Vitals Group     BP 134/77     Pulse Rate (!) 56  Resp 16     Temp 97.8 F (36.6 C)     Temp Source Oral     SpO2 96 %     Weight 158 lb (71.7 kg)     Height 5\' 5"  (1.651 m)     Head Circumference      Peak Flow      Pain Score 7     Pain Loc      Pain Edu?      Excl. in GC?     Constitutional: Alert and oriented. Well appearing and in no acute distress. Eyes: Conjunctivae are normal.  Head: Atraumatic. Neck: No stridor.   Cardiovascular: Normal rate, regular rhythm. Grossly normal heart sounds.  Good peripheral circulation. Respiratory: Normal respiratory effort.   No retractions. Lungs CTAB. Gastrointestinal: Soft and nontender. No distention.  No CVA tenderness. Musculoskeletal: On examination of the back there is no gross deformity and no point tenderness on palpation of the thoracic or lumbar spine.  There is tenderness on palpation of the right SI joint area.  Range of motion is slightly restricted due to discomfort however patient is ambulatory without assistance.  Straight leg raises were negative.  Good muscle strength bilaterally. Neurologic:  Normal speech and language. No gross focal neurologic deficits are appreciated.  Patellar reflexes are equal at 2+ bilaterally.  No gait instability. Skin:  Skin is warm, dry and intact. No rash noted. Psychiatric: Mood and affect are normal. Speech and behavior are normal.  ____________________________________________   LABS (all labs ordered are listed, but only abnormal results are displayed)  Labs Reviewed - No data to display   PROCEDURES  Procedure(s) performed: None  Procedures  Critical Care performed: No  ____________________________________________   INITIAL IMPRESSION / ASSESSMENT AND PLAN / ED COURSE  As part of my medical decision making, I reviewed the following data within the electronic MEDICAL RECORD NUMBER Notes from prior ED visits and [place] Controlled Substance Database  Sciatica was explained to the patient.  Patient drove herself to the ED.  She was discharged with a prescription for tramadol 50 mg 1 every 8 hours as needed for pain and diazepam 2 mg every 8 hours.  She is encouraged to use ice or heat to her back as needed.  She will follow-up with her PCP if any continued problems.  She is aware that both medications could cause drowsiness and increase her risk for falling.  ____________________________________________   FINAL CLINICAL IMPRESSION(S) / ED DIAGNOSES  Final diagnoses:  Acute right-sided low back pain with right-sided sciatica     ED Discharge Orders         Ordered    traMADol (ULTRAM) 50 MG tablet  Every 8 hours PRN     01/12/18 0925    diazepam (VALIUM) 2 MG tablet     01/12/18 [id number]       Note:  This document was prepared using Dragon voice recognition software and may include unintentional dictation errors.    Tommi RumpsSummers, Kanchan Gal L, PA-C 01/12/18 1510    Minna AntisPaduchowski, Kevin, MD 01/13/18 1245

## 2018-01-12 NOTE — ED Triage Notes (Signed)
Pt c/o low back pain since Wednesday. Denies urinary symptoms. Denies injury. Alert, oriented. Ambulatory.

## 2018-01-12 NOTE — Discharge Instructions (Addendum)
Continue taking your regular medication. Today you are given 2 medications for your back pain.  Do not take these medications if you plan on driving.  These medications could cause drowsiness and increase your risk for injury.  It may be necessary for you to take them only at night if they can cause drowsiness.  Diazepam 2 mg 1 every 8 hours as needed for muscle spasms.  Tramadol 50 mg 1 every 8 hours as needed for pain.  You may still take Tylenol with these medications if necessary.  Follow-up with your primary care provider if any continued problems.

## 2018-01-12 NOTE — ED Notes (Signed)
See triage note  Presents with lower back pain which started on weds.  States pain occasionally moves into right leg  Min relief with tylenol and ice  Ambulates well to treatment room  Unsure of injury

## 2018-02-01 ENCOUNTER — Telehealth: Payer: Self-pay | Admitting: Urology

## 2018-02-01 MED ORDER — NITROFURANTOIN MACROCRYSTAL 100 MG PO CAPS: 100.0000 mg | ORAL_CAPSULE | Freq: Every day | ORAL | 1 refills | Status: DC

## 2018-02-01 NOTE — Telephone Encounter (Signed)
Pt called and needs an RX.  She is out of meds and need enough called in before next appt.  Macrodantin  Please give pt a call and let her know this has been done.

## 2018-02-01 NOTE — Telephone Encounter (Signed)
Tried to reach pt, no answer, vm not set up. [facility]

## 2018-02-04 ENCOUNTER — Other Ambulatory Visit: Payer: Self-pay

## 2018-02-04 ENCOUNTER — Telehealth: Payer: Self-pay | Admitting: Urology

## 2018-02-04 MED ORDER — NITROFURANTOIN MONOHYD MACRO 100 MG PO CAPS: 100.0000 mg | ORAL_CAPSULE | Freq: Every day | ORAL | 1 refills | Status: DC

## 2018-02-04 NOTE — Telephone Encounter (Signed)
I spoke with pt, she states the wrong rx was sent to the pharmacy. Pt states the pills look different. Per Dr. Mina MarbleMacDiarmid's last office note, pt is to be on daily Macrodantin. I then called and spoke w/ pharmacy, he states they [facility], then the pt stated she could not take any of them. Pt needs to come in for appointment to discuss abx.

## 2018-02-04 NOTE — Telephone Encounter (Signed)
Pt called back AGAIN and left 2 messages.  She said she someone from here tried to call her and she was on the phone with the pharmacy and

## 2018-03-02 ENCOUNTER — Other Ambulatory Visit: Payer: Self-pay

## 2018-03-02 ENCOUNTER — Encounter: Payer: Self-pay | Admitting: Emergency Medicine

## 2018-03-02 ENCOUNTER — Emergency Department: Payer: Medicare Other

## 2018-03-02 ENCOUNTER — Emergency Department
Admission: EM | Admit: 2018-03-02 | Discharge: 2018-03-02 | Disposition: A | Discharge status: Home / Self Care | Payer: Medicare Other | Attending: Emergency Medicine | Admitting: Emergency Medicine

## 2018-03-02 DIAGNOSIS — I872 Venous insufficiency (chronic) (peripheral): Secondary | ICD-10-CM | POA: Insufficient documentation

## 2018-03-02 DIAGNOSIS — Z7984 Long term (current) use of oral hypoglycemic drugs: Secondary | ICD-10-CM | POA: Insufficient documentation

## 2018-03-02 DIAGNOSIS — M79671 Pain in right foot: Secondary | ICD-10-CM | POA: Insufficient documentation

## 2018-03-02 DIAGNOSIS — E119 Type 2 diabetes mellitus without complications: Secondary | ICD-10-CM | POA: Insufficient documentation

## 2018-03-02 DIAGNOSIS — I10 Essential (primary) hypertension: Secondary | ICD-10-CM | POA: Insufficient documentation

## 2018-03-02 DIAGNOSIS — Z7982 Long term (current) use of aspirin: Secondary | ICD-10-CM | POA: Insufficient documentation

## 2018-03-02 DIAGNOSIS — R600 Localized edema: Secondary | ICD-10-CM | POA: Diagnosis not present

## 2018-03-02 DIAGNOSIS — Z79899 Other long term (current) drug therapy: Secondary | ICD-10-CM | POA: Diagnosis not present

## 2018-03-02 LAB — CBC WITH DIFFERENTIAL/PLATELET
Basophils Absolute: 0.1 10*3/uL (ref 0–0.1)
Basophils Relative: 1 %
Eosinophils Absolute: 0.1 10*3/uL (ref 0–0.7)
Eosinophils Relative: 2 %
HCT: 39.7 % (ref 35.0–47.0)
Hemoglobin: 14 g/dL (ref 12.0–16.0)
Lymphocytes Relative: 30 %
Lymphs Abs: 1.9 10*3/uL (ref 1.0–3.6)
MCH: 30.3 pg (ref 26.0–34.0)
MCHC: 35.2 g/dL (ref 32.0–36.0)
MCV: 86 fL (ref 80.0–100.0)
Monocytes Absolute: 0.8 10*3/uL (ref 0.2–0.9)
Monocytes Relative: 12 %
Neutro Abs: 3.5 10*3/uL (ref 1.4–6.5)
Neutrophils Relative %: 55 %
Platelets: 274 10*3/uL (ref 150–440)
RBC: 4.62 MIL/uL (ref 3.80–5.20)
RDW: 13.7 % (ref 11.5–14.5)
WBC: 6.3 10*3/uL (ref 3.6–11.0)

## 2018-03-02 LAB — COMPREHENSIVE METABOLIC PANEL
ALT: 15 U/L (ref 0–44)
AST: 24 U/L (ref 15–41)
Albumin: 4.6 g/dL (ref 3.5–5.0)
Alkaline Phosphatase: 67 U/L (ref 38–126)
Anion gap: 11 (ref 5–15)
BUN: 19 mg/dL (ref 8–23)
CO2: 28 mmol/L (ref 22–32)
Calcium: 10.3 mg/dL (ref 8.9–10.3)
Chloride: 102 mmol/L (ref 98–111)
Creatinine, Ser: 1.17 mg/dL — ABNORMAL HIGH (ref 0.44–1.00)
GFR calc Af Amer: 51 mL/min — ABNORMAL LOW (ref >60)
GFR calc non Af Amer: 44 mL/min — ABNORMAL LOW (ref >60)
Glucose, Bld: 127 mg/dL — ABNORMAL HIGH (ref 70–99)
Potassium: 3.9 mmol/L (ref 3.5–5.1)
Sodium: 141 mmol/L (ref 135–145)
Total Bilirubin: 0.5 mg/dL (ref 0.3–1.2)
Total Protein: 7.5 g/dL (ref 6.5–8.1)

## 2018-03-02 LAB — BRAIN NATRIURETIC PEPTIDE: B Natriuretic Peptide: 101 pg/mL — ABNORMAL HIGH (ref 0.0–100.0)

## 2018-03-02 MED ORDER — TRAMADOL HCL 50 MG PO TABS: 50.0000 mg | ORAL_TABLET | Freq: Four times a day (QID) | ORAL | 0 refills | Status: AC | PRN

## 2018-03-02 MED ORDER — TRAMADOL HCL 50 MG PO TABS
50.0000 mg | ORAL_TABLET | Freq: Once | ORAL | Status: AC
  Administered 2018-03-02: 50 mg via ORAL
  Filled 2018-03-02: qty 1

## 2018-03-02 NOTE — ED Provider Notes (Signed)
[facility] Emergency Department Provider Note  ____________________________________________  Time seen: Approximately 8:56 PM  I have reviewed the triage vital signs and the nursing notes.   HISTORY  Chief Complaint Foot Pain    HPI Tamara [name] is a 77 y.o. female with a history of diabetes and hypertension, presents to the emergency department with edema of the right foot and right ankle that started 1 day ago.  Patient denies falls or traumas.  She denies recent surgery, prolonged immobilization, recent weight loss or weight gain or prior history of DVT or PE.  Patient denies a history of heart failure.  Patient denies cough, shortness of breath or increased pillow usage at night.  Patient reports that she has had no renal issues in the past.  Patient reports that she has been engaging in increased physical activity over the past 2 to 3 months with increased walking.  No alleviating measures have been attempted.   Past Medical History:  Diagnosis Date  . Diabetes mellitus without complication (HCC)   . Hypercholesteremia   . Hypertension     Patient Active Problem List   Diagnosis Date Noted  . UTI (urinary tract infection) 01/09/2017    Past Surgical History:  Procedure Laterality Date  . EYE SURGERY      Prior to Admission medications   Medication Sig Start Date End Date Taking? Authorizing Provider  amLODipine-olmesartan (AZOR) 10-40 MG tablet  08/02/17   [provider]  aspirin 81 MG tablet Take 81 mg by mouth daily.    [provider]  atenolol (TENORMIN) 25 MG tablet Take by mouth.    [provider]  atenolol-chlorthalidone (TENORETIC) 100-25 MG tablet  12/27/17   [provider]  bifidobacterium infantis (ALIGN) capsule Take by mouth.    [provider]  Cholecalciferol (VITAMIN D-1000 MAX ST) 1000 units tablet Take 1,000 Units by mouth daily.     [provider]  diazepam (VALIUM)  2 MG tablet 1 tablet every 8 hours as needed for muscles. 01/12/18   Tommi Rumps, PA-C  hydrOXYzine (VISTARIL) 25 MG capsule  12/08/17   [provider]  metFORMIN (GLUCOPHAGE) 500 MG tablet Take 500 mg by mouth daily with breakfast.     [provider]  nitrofurantoin (MACRODANTIN) 100 MG capsule Take 1 capsule (100 mg total) by mouth daily. 02/01/18   Alfredo Martinez, MD  nitrofurantoin, macrocrystal-monohydrate, (MACROBID) 100 MG capsule Take 1 capsule (100 mg total) by mouth daily. 02/04/18   Alfredo Martinez, MD  simvastatin (ZOCOR) 10 MG tablet Take 10 mg by mouth daily at 6 PM.  07/26/15   [provider]  traMADol (ULTRAM) 50 MG tablet Take 1 tablet (50 mg total) by mouth every 6 (six) hours as needed for up to 3 days. 03/02/18 03/05/18  Orvil Feil, PA-C  UNKNOWN TO PATIENT Another HTN combo med    [provider]    Allergies Cefoxitin  Family History  Problem Relation Age of Onset  . Diabetes type II Brother   . Hypertension Brother   . Bladder Cancer Neg Hx   . Kidney cancer Neg Hx   . Breast cancer Neg Hx     Social History Social History   Tobacco Use  . Smoking status: Never Smoker  . Smokeless tobacco: Never Used  Substance Use Topics  . Alcohol use: No  . Drug use: Never     Review of Systems  Constitutional: No fever/chills Eyes: No visual  changes. No discharge ENT: No upper respiratory complaints. Cardiovascular: no chest pain. Respiratory: no cough. No SOB. Gastrointestinal: No abdominal pain.  No nausea, no vomiting.  No diarrhea.  No constipation. Musculoskeletal: Patient has right foot pain.  Skin: Patient has edema of right foot and right ankle.  Neurological: Negative for headaches, focal weakness or numbness.   ____________________________________________   PHYSICAL EXAM:  VITAL SIGNS: ED Triage Vitals  Enc Vitals Group     BP 03/02/18 1919 (!) 148/99     Pulse Rate 03/02/18 1919 62     Resp  03/02/18 1919 18     Temp 03/02/18 1919 97.6 F (36.4 C)     Temp Source 03/02/18 1919 Oral     SpO2 03/02/18 1919 99 %     Weight 03/02/18 1920 158 lb (71.7 kg)     Height 03/02/18 1920 5\' 5"  (1.651 m)     Head Circumference --      Peak Flow --      Pain Score 03/02/18 1920 7     Pain Loc --      Pain Edu? --      Excl. in GC? --      Constitutional: Alert and oriented. Well appearing and in no acute distress. Eyes: Conjunctivae are normal. PERRL. EOMI. Head: Atraumatic. Cardiovascular: Normal rate, regular rhythm. Normal S1 and S2.  Good peripheral circulation. Respiratory: Normal respiratory effort without tachypnea or retractions. Lungs CTAB. Good air entry to the bases with no decreased or absent breath sounds. Musculoskeletal: Full range of motion to all extremities. No gross deformities appreciated. Neurologic:  Normal speech and language. No gross focal neurologic deficits are appreciated.  Skin: Patient has 2+ pitting edema of the right foot and right ankle.  No erythema.  Patient has pain with palpation over the dorsum of the right foot.  Palpable dorsalis pedis pulse, right. Psychiatric: Mood and affect are normal. Speech and behavior are normal. Patient exhibits appropriate insight and judgement.   ____________________________________________   LABS (all labs ordered are listed, but only abnormal results are displayed)  Labs Reviewed  COMPREHENSIVE METABOLIC PANEL - Abnormal; Notable for the following components:      Result Value   Glucose, Bld 127 (*)    Creatinine, Ser 1.17 (*)    GFR calc non Af Amer 44 (*)    GFR calc Af Amer 51 (*)    All other components within normal limits  BRAIN NATRIURETIC PEPTIDE - Abnormal; Notable for the following components:   B Natriuretic Peptide 101.0 (*)    All other components within normal limits  CBC WITH DIFFERENTIAL/PLATELET    ____________________________________________  EKG   ____________________________________________  RADIOLOGY I personally viewed and evaluated these images as part of my medical decision making, as well as reviewing the written report by the radiologist.    [place] Venous Img Lower Unilateral Right  Result Date: 03/02/2018 CLINICAL DATA:  Patient with right foot and ankle swelling. EXAM: RIGHT LOWER EXTREMITY VENOUS DOPPLER ULTRASOUND TECHNIQUE: Gray-scale sonography with graded compression, as well as color Doppler and duplex ultrasound were performed to evaluate the lower extremity deep venous systems from the level of the common femoral vein and including the common femoral, femoral, profunda femoral, popliteal and calf veins including the posterior tibial, peroneal and gastrocnemius veins when visible. The superficial great saphenous vein was also interrogated. Spectral Doppler was utilized to evaluate flow at rest and with distal augmentation maneuvers in the common femoral, femoral and popliteal veins. COMPARISON:  None. FINDINGS: Contralateral Common Femoral Vein: Respiratory phasicity is normal and symmetric with the symptomatic side. No evidence of thrombus. Normal compressibility. Common Femoral Vein: No evidence of thrombus. Normal compressibility, respiratory phasicity and response to augmentation. Saphenofemoral Junction: No evidence of thrombus. Normal compressibility and flow on color Doppler imaging. Profunda Femoral Vein: No evidence of thrombus. Normal compressibility and flow on color Doppler imaging. Femoral Vein: No evidence of thrombus. Normal compressibility, respiratory phasicity and response to augmentation. Popliteal Vein: No evidence of thrombus. Normal compressibility, respiratory phasicity and response to augmentation. Calf Veins: No evidence of thrombus. Normal compressibility and flow on color Doppler imaging. Superficial Great Saphenous Vein: No evidence of thrombus.  Normal compressibility. Venous Reflux:  None. Other Findings:  None. IMPRESSION: No evidence for right lower extremity DVT. Electronically Signed   By: Annia Belt M.D.   On: 03/02/2018 22:00   Dg Foot Complete Right  Result Date: 03/02/2018 CLINICAL DATA:  77 year old female with history of lateral swelling in the right foot. EXAM: RIGHT FOOT COMPLETE - 3+ VIEW COMPARISON:  No priors. FINDINGS: There is no evidence of fracture or dislocation. Hallux valgus. There is no other evidence of arthropathy or other focal bone abnormality. Soft tissues are unremarkable. IMPRESSION: No acute findings to account for the patient's symptoms. Electronically Signed   By: Trudie Reed M.D.   On: 03/02/2018 22:15    ____________________________________________    PROCEDURES  Procedure(s) performed:    Procedures    Medications  traMADol (ULTRAM) tablet 50 mg (has no administration in time range)     ____________________________________________   INITIAL IMPRESSION / ASSESSMENT AND PLAN / ED COURSE  Pertinent labs & imaging results that were available during my care of the patient were reviewed by me and considered in my medical decision making (see chart for details).  Review of the [place] CSRS was performed in accordance of the [facility] prior to dispensing any controlled drugs.      Assessment and Plan:  Right foot swelling  Patient presents to the emergency department with acute right foot pain and edema that started 1 day ago.  Differential diagnosis included DVT, renal insufficiency, congestive heart failure and venous insufficiency.  CMP was reassuring.  Venous ultrasound was noncontributory for thromboembolism.  X-ray revealed no acute fractures or bony abnormalities.  I suspect that patient's right foot pain and edema is from venous insufficiency.  Patient was advised to use compression stockings when walking.  Patient was discharged with a short course of tramadol.  Vital signs were  reassuring prior to discharge.  All patient questions were answered.   ____________________________________________  FINAL CLINICAL IMPRESSION(S) / ED DIAGNOSES  Final diagnoses:  Right foot pain  Venous insufficiency      NEW MEDICATIONS STARTED DURING THIS VISIT:  ED Discharge Orders         Ordered    Compression stockings     03/02/18 2241    traMADol (ULTRAM) 50 MG tablet  Every 6 hours PRN     03/02/18 2241              This chart was dictated using voice recognition software/Dragon. Despite best efforts to proofread, errors can occur which can change the meaning. Any change was purely unintentional.    Orvil Feil, PA-C 03/02/18 2256    Myrna Blazer, MD 03/02/18 [phone]

## 2018-03-02 NOTE — ED Triage Notes (Signed)
R foot pain noted yesterday, swollen, denies injury.

## 2018-03-02 NOTE — ED Notes (Signed)
Pt stated that swelling started in her right ankle and foot on yesterday. Family at bedside.

## 2018-03-25 ENCOUNTER — Ambulatory Visit (INDEPENDENT_AMBULATORY_CARE_PROVIDER_SITE_OTHER): Payer: Medicare Other | Admitting: Urology

## 2018-03-25 ENCOUNTER — Encounter: Payer: Self-pay | Admitting: Urology

## 2018-03-25 VITALS — BP 106/65 | HR 54 | Ht 65.0 in

## 2018-03-25 DIAGNOSIS — N3020 Other chronic cystitis without hematuria: Secondary | ICD-10-CM

## 2018-03-25 MED ORDER — NITROFURANTOIN MONOHYD MACRO 100 MG PO CAPS: 100.0000 mg | ORAL_CAPSULE | Freq: Every day | ORAL | 11 refills | Status: DC

## 2018-03-25 NOTE — Progress Notes (Signed)
03/25/2018 10:52 AM   Tamara [name] 1941-05-22 [id number]  Referring provider: [facility] [address] [place], [place] [address]  Chief Complaint  Patient presents with  . Cystitis    1year    HPI: The patient was discharged in July 2018 and had had acute encephalopathy with urinary tract infection and sepsis. Culture was positive. Her renal ultrasound in July was normal  I follow the patient in [place] and I have had her on daily trimethoprim for 2 or 3 years. She still gets approximate 2 infections a year and her symptoms were a little bit nonspecific. Her daughter from [facility] was here and there is no question that about every 2 year she can end up in the hospital with CNS symptoms and she often gets foul smelling urine. She believes her urine is a bit foul-smelling in the morning now.  She has not had a hysterectomy. She is continent. She voids every 2 or 3 hours and gets up once a night.  I thought it was best to send the urine for culture. I would like to treat her for a possible urinary tract infection and switch her to daily Macrodantin. Based upon the last sensitivity I called and ciprofloxacin 250 mg twice a day for 1 week  Today Frequency stable Infections on daily Macrodantin.  Clinically not infected   PMH: Past Medical History:  Diagnosis Date  . Diabetes mellitus without complication (HCC)   . Hypercholesteremia   . Hypertension     Surgical History: Past Surgical History:  Procedure Laterality Date  . EYE SURGERY      Home Medications:  Allergies as of 03/25/2018      Reactions   Cefoxitin Diarrhea      Medication List        Accurate as of 03/25/18 10:52 AM. Always use your most recent med list.          amLODipine-olmesartan 10-40 MG tablet Commonly known as:  AZOR   aspirin 81 MG tablet Take 81 mg by mouth daily.   atenolol 25 MG tablet Commonly known as:  TENORMIN Take by mouth.   atenolol-chlorthalidone  100-25 MG tablet Commonly known as:  TENORETIC   bifidobacterium infantis capsule Take by mouth.   diazepam 2 MG tablet Commonly known as:  VALIUM 1 tablet every 8 hours as needed for muscles.   hydrOXYzine 25 MG capsule Commonly known as:  VISTARIL   metFORMIN 500 MG tablet Commonly known as:  GLUCOPHAGE Take 500 mg by mouth daily with breakfast.   nitrofurantoin (macrocrystal-monohydrate) 100 MG capsule Commonly known as:  MACROBID Take 1 capsule (100 mg total) by mouth daily.   nitrofurantoin 100 MG capsule Commonly known as:  MACRODANTIN Take 1 capsule (100 mg total) by mouth daily.   simvastatin 10 MG tablet Commonly known as:  ZOCOR Take 10 mg by mouth daily at 6 PM.   UNKNOWN TO PATIENT Another HTN combo med   VITAMIN D-1000 MAX ST 1000 units tablet Generic drug:  Cholecalciferol Take 1,000 Units by mouth daily.       Allergies:  Allergies  Allergen Reactions  . Cefoxitin Diarrhea    Family History: Family History  Problem Relation Age of Onset  . Diabetes type II Brother   . Hypertension Brother   . Bladder Cancer Neg Hx   . Kidney cancer Neg Hx   . Breast cancer Neg Hx     Social History:  reports that she has never smoked.  She has never used smokeless tobacco. She reports that she does not drink alcohol or use drugs.  ROS: UROLOGY Frequent Urination?: No Hard to postpone urination?: No Burning/pain with urination?: No Get up at night to urinate?: No Leakage of urine?: No Urine stream starts and stops?: No Trouble starting stream?: No Do you have to strain to urinate?: No Blood in urine?: No Urinary tract infection?: No Sexually transmitted disease?: No Injury to kidneys or bladder?: No Painful intercourse?: No Weak stream?: No Currently pregnant?: No Vaginal bleeding?: No Last menstrual period?: n  Gastrointestinal Nausea?: No Vomiting?: No Indigestion/heartburn?: No Diarrhea?: No Constipation?: No  Constitutional Fever:  No Night sweats?: No Weight loss?: No Fatigue?: No  Skin Skin rash/lesions?: No Itching?: No  Eyes Blurred vision?: No Double vision?: No  Ears/Nose/Throat Sore throat?: No Sinus problems?: No  Hematologic/Lymphatic Swollen glands?: No Easy bruising?: No  Cardiovascular Leg swelling?: No Chest pain?: No  Respiratory Cough?: No Shortness of breath?: No  Endocrine Excessive thirst?: No  Musculoskeletal Back pain?: No Joint pain?: No  Neurological Headaches?: No Dizziness?: No  Psychologic Depression?: No Anxiety?: No  Physical Exam: BP 106/65   Pulse (!) 54   Ht 5\' 5"  (1.651 m)   BMI 26.29 kg/m   Constitutional:  Alert and oriented, No acute distress. HEENT: [place] AT, moist mucus membranes.  Trachea midline, no masses.  Laboratory Data: Lab Results  Component Value Date   WBC 6.3 03/02/2018   HGB 14.0 03/02/2018   HCT 39.7 03/02/2018   MCV 86.0 03/02/2018   PLT 274 03/02/2018    Lab Results  Component Value Date   CREATININE 1.17 (H) 03/02/2018    No results found for: PSA  No results found for: TESTOSTERONE  Lab Results  Component Value Date   HGBA1C 6.2 (H) 01/09/2017    Urinalysis    Component Value Date/Time   COLORURINE YELLOW (A) 01/09/2017 1725   APPEARANCEUR Clear 03/20/2017 1011   LABSPEC 1.010 06/23/2017 1907   LABSPEC 1.017 10/21/2011 1422   PHURINE 6.0 06/23/2017 1907   GLUCOSEU NEGATIVE 06/23/2017 1907   GLUCOSEU Negative 10/21/2011 1422   HGBUR TRACE (A) 06/23/2017 1907   BILIRUBINUR NEGATIVE 06/23/2017 1907   BILIRUBINUR Negative 03/20/2017 1011   BILIRUBINUR Negative 10/21/2011 1422   KETONESUR NEGATIVE 06/23/2017 1907   PROTEINUR NEGATIVE 06/23/2017 1907   UROBILINOGEN 0.2 06/23/2017 1907   NITRITE NEGATIVE 06/23/2017 1907   LEUKOCYTESUR NEGATIVE 06/23/2017 1907   LEUKOCYTESUR Negative 03/20/2017 1011   LEUKOCYTESUR 2+ 10/21/2011 1422    Pertinent Imaging:   Assessment & Plan: 90 tablets and 3 refills  sent to pharmacy and I will see her in 1 year  There are no diagnoses linked to this encounter.  No follow-ups on file.  Martina SinnerMACDIARMID,Baldwin Racicot A, MD  [facility] [address], [address] [place], [place] [address] [phone]

## 2018-03-25 NOTE — Addendum Note (Signed)
Addended by: Tatumn ClanWATTS, Kiyoto Slomski M on: 03/25/2018 10:55 AM   Modules accepted: Orders

## 2018-04-02 ENCOUNTER — Ambulatory Visit: Payer: Medicare Other | Admitting: Podiatry

## 2018-04-17 ENCOUNTER — Ambulatory Visit: Payer: Medicare Other | Admitting: Obstetrics and Gynecology

## 2018-04-17 ENCOUNTER — Encounter: Payer: Self-pay | Admitting: Obstetrics and Gynecology

## 2018-04-17 VITALS — BP 140/84 | HR 51 | Ht 65.0 in | Wt 157.0 lb

## 2018-04-17 DIAGNOSIS — N898 Other specified noninflammatory disorders of vagina: Secondary | ICD-10-CM

## 2018-04-17 NOTE — Patient Instructions (Signed)
I value your feedback and entrusting us with your care. If you get a [facility] patient survey, I would appreciate you taking the time to let us know about your experience today. Thank you! 

## 2018-04-17 NOTE — Progress Notes (Signed)
[name], MD   Chief Complaint  Patient presents with  . Vaginal bump    noticed it last wednesday, tender when she touches it other than that no pain    HPI:      Tamara [name] is a 77 y.o. G2P0 who LMP was No LMP recorded. Patient is postmenopausal., presents today for LT vaginal lesion for the past wk. Area is tender, no drainage. No meds to treat. No prior cuts/scratches.   Green stools resolved (seen 4/19) and blocked sebaceous gland buttocks stable (seen 2/19). Last annual 3/18.   Past Medical History:  Diagnosis Date  . Diabetes mellitus without complication (HCC)   . Hypercholesteremia   . Hyperlipidemia   . Hypertension     Past Surgical History:  Procedure Laterality Date  . APPENDECTOMY    . EYE SURGERY      Family History  Problem Relation Age of Onset  . Diabetes type II Brother   . Hypertension Brother   . Bladder Cancer Neg Hx   . Kidney cancer Neg Hx   . Breast cancer Neg Hx     Social History   Socioeconomic History  . Marital status: Widowed    Spouse name: Not on file  . Number of children: Not on file  . Years of education: Not on file  . Highest education level: Not on file  Occupational History  . Not on file  Social Needs  . Financial resource strain: Not on file  . Food insecurity:    Worry: Not on file    Inability: Not on file  . Transportation needs:    Medical: Not on file    Non-medical: Not on file  Tobacco Use  . Smoking status: Never Smoker  . Smokeless tobacco: Never Used  Substance and Sexual Activity  . Alcohol use: No  . Drug use: Never  . Sexual activity: Not Currently    Birth control/protection: Post-menopausal  Lifestyle  . Physical activity:    Days per week: Not on file    Minutes per session: Not on file  . Stress: Not on file  Relationships  . Social connections:    Talks on phone: Not on file    Gets together: Not on file    Attends religious service: Not on file    Active  member of club or organization: Not on file    Attends meetings of clubs or organizations: Not on file    Relationship status: Not on file  . Intimate partner violence:    Fear of current or ex partner: Not on file    Emotionally abused: Not on file    Physically abused: Not on file    Forced sexual activity: Not on file  Other Topics Concern  . Not on file  Social History Narrative  . Not on file    Outpatient Medications Prior to Visit  Medication Sig Dispense Refill  . amLODipine-olmesartan (AZOR) 10-40 MG tablet     . aspirin 81 MG tablet Take 81 mg by mouth daily.    Marland Kitchen atenolol (TENORMIN) 25 MG tablet Take by mouth.    Marland Kitchen atenolol-chlorthalidone (TENORETIC) 100-25 MG tablet   2  . bifidobacterium infantis (ALIGN) capsule Take by mouth.    . Cholecalciferol (VITAMIN D-1000 MAX ST) 1000 units tablet Take 1,000 Units by mouth daily.     . diazepam (VALIUM) 2 MG tablet 1 tablet every 8 hours as needed for muscles. 9 tablet  0  . hydrOXYzine (VISTARIL) 25 MG capsule   0  . metFORMIN (GLUCOPHAGE) 500 MG tablet Take 500 mg by mouth daily with breakfast.     . nitrofurantoin (MACRODANTIN) 100 MG capsule Take 1 capsule (100 mg total) by mouth daily. 30 capsule 1  . nitrofurantoin, macrocrystal-monohydrate, (MACROBID) 100 MG capsule Take 1 capsule (100 mg total) by mouth daily. 30 capsule 11  . simvastatin (ZOCOR) 10 MG tablet Take 10 mg by mouth daily at 6 PM.     . UNKNOWN TO PATIENT Another HTN combo med     No facility-administered medications prior to visit.      ROS:  Review of Systems  Constitutional: Negative for fever.  Gastrointestinal: Negative for blood in stool, constipation, diarrhea, nausea and vomiting.  Genitourinary: Positive for genital sores. Negative for dyspareunia, dysuria, flank pain, frequency, hematuria, urgency, vaginal bleeding, vaginal discharge and vaginal pain.  Musculoskeletal: Negative for back pain.  Skin: Negative for rash.   BREAST: No  symptoms   OBJECTIVE:   Vitals:  BP 140/84   Pulse (!) 51   Ht 5\' 5"  (1.651 m)   Wt 157 lb (71.2 kg)   BMI 26.13 kg/m   Physical Exam  Constitutional: She is oriented to person, place, and time. Vital signs are normal. She appears well-developed.  Pulmonary/Chest: Effort normal.  Genitourinary: Vagina normal and uterus normal.    There is no rash, tenderness or lesion on the right labia. There is no rash, tenderness or lesion on the left labia. Uterus is not enlarged and not tender. Cervix exhibits no motion tenderness. Right adnexum displays no mass and no tenderness. Left adnexum displays no mass and no tenderness. No erythema or tenderness in the vagina. No vaginal discharge found.  Musculoskeletal: Normal range of motion.  Neurological: She is alert and oriented to person, place, and time.  Psychiatric: She has a normal mood and affect. Her behavior is normal. Thought content normal.  Vitals reviewed.   Assessment/Plan: Vaginal lesion - C/W a "pimple". Warm compresses. Leave alone. Reassurance. F/u prn.    Return if symptoms worsen or fail to improve.  Aarib Pulido B. Zarra Geffert, PA-C 04/17/2018 11:06 AM

## 2018-04-19 ENCOUNTER — Encounter: Payer: Self-pay | Admitting: Podiatry

## 2018-04-19 ENCOUNTER — Ambulatory Visit: Payer: Medicare Other | Admitting: Podiatry

## 2018-04-19 DIAGNOSIS — E0842 Diabetes mellitus due to underlying condition with diabetic polyneuropathy: Secondary | ICD-10-CM | POA: Diagnosis not present

## 2018-04-19 DIAGNOSIS — M79676 Pain in unspecified toe(s): Secondary | ICD-10-CM

## 2018-04-19 DIAGNOSIS — B351 Tinea unguium: Secondary | ICD-10-CM

## 2018-04-19 NOTE — Progress Notes (Signed)
   SUBJECTIVE Patient with a history of diabetes mellitus presents to office today complaining of elongated, thickened nails that cause pain while ambulating in shoes. She is unable to trim her own nails. Patient is here for further evaluation and treatment.   Past Medical History:  Diagnosis Date  . Diabetes mellitus without complication (HCC)   . Hypercholesteremia   . Hyperlipidemia   . Hypertension     OBJECTIVE General Patient is awake, alert, and oriented x 3 and in no acute distress. Derm Skin is dry and supple bilateral. Negative open lesions or macerations. Remaining integument unremarkable. Nails are tender, long, thickened and dystrophic with subungual debris, consistent with onychomycosis, 1-5 bilateral. No signs of infection noted. Vasc  DP and PT pedal pulses palpable bilaterally. Temperature gradient within normal limits.  Neuro Epicritic and protective threshold sensation diminished bilaterally.  Musculoskeletal Exam No symptomatic pedal deformities noted bilateral. Muscular strength within normal limits.  ASSESSMENT 1. Diabetes Mellitus w/ peripheral neuropathy 2. 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 Pain in foot bilateral  PLAN OF CARE 1. Patient evaluated today. 2. Instructed to maintain good pedal hygiene and foot care. Stressed importance of controlling blood sugar.  3. Mechanical debridement of nails 1-5 bilaterally performed using a nail nipper. Filed with dremel without incident.  4. Return to clinic in 3 mos.     Brent M. Evans, DPM [facility]  Dr. Brent M. Evans, DPM    [address]                                        [place],  [address]                Office [phone]  Fax [phone]      

## 2018-06-06 ENCOUNTER — Other Ambulatory Visit: Payer: Self-pay | Admitting: Internal Medicine

## 2018-06-06 DIAGNOSIS — Z1231 Encounter for screening mammogram for malignant neoplasm of breast: Secondary | ICD-10-CM

## 2018-07-19 ENCOUNTER — Ambulatory Visit: Payer: Medicare Other | Admitting: Podiatry

## 2018-07-19 DIAGNOSIS — B351 Tinea unguium: Secondary | ICD-10-CM | POA: Diagnosis not present

## 2018-07-19 DIAGNOSIS — E0842 Diabetes mellitus due to underlying condition with diabetic polyneuropathy: Secondary | ICD-10-CM

## 2018-07-19 DIAGNOSIS — M79676 Pain in unspecified toe(s): Secondary | ICD-10-CM

## 2018-07-21 NOTE — Progress Notes (Signed)
   SUBJECTIVE Patient with a history of diabetes mellitus presents to office today complaining of elongated, thickened nails that cause pain while ambulating in shoes. She is unable to trim her own nails. Patient is here for further evaluation and treatment.   Past Medical History:  Diagnosis Date  . Diabetes mellitus without complication (HCC)   . Hypercholesteremia   . Hyperlipidemia   . Hypertension     OBJECTIVE General Patient is awake, alert, and oriented x 3 and in no acute distress. Derm Skin is dry and supple bilateral. Negative open lesions or macerations. Remaining integument unremarkable. Nails are tender, long, thickened and dystrophic with subungual debris, consistent with onychomycosis, 1-5 bilateral. No signs of infection noted. Vasc  DP and PT pedal pulses palpable bilaterally. Temperature gradient within normal limits.  Neuro Epicritic and protective threshold sensation diminished bilaterally.  Musculoskeletal Exam No symptomatic pedal deformities noted bilateral. Muscular strength within normal limits.  ASSESSMENT 1. Diabetes Mellitus w/ peripheral neuropathy 2. 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 Pain in foot bilateral  PLAN OF CARE 1. Patient evaluated today. 2. Instructed to maintain good pedal hygiene and foot care. Stressed importance of controlling blood sugar.  3. Mechanical debridement of nails 1-5 bilaterally performed using a nail nipper. Filed with dremel without incident.  4. Return to clinic in 3 mos.     Felecia Shelling, DPM [facility]  Dr. Felecia Shelling, DPM    [address]                                        [place], [place] [address]                Office [phone]  Fax [phone]

## 2018-08-09 ENCOUNTER — Ambulatory Visit
Admission: RE | Admit: 2018-08-09 | Discharge: 2018-08-09 | Disposition: A | Discharge status: Home / Self Care | Payer: Medicare Other | Source: Ambulatory Visit | Attending: Gastroenterology | Admitting: Gastroenterology

## 2018-08-09 ENCOUNTER — Encounter: Payer: Self-pay | Admitting: *Deleted

## 2018-08-09 ENCOUNTER — Encounter
Admission: RE | Disposition: A | Discharge status: Home / Self Care | Payer: Self-pay | Source: Ambulatory Visit | Attending: Gastroenterology

## 2018-08-09 ENCOUNTER — Ambulatory Visit: Payer: Medicare Other | Admitting: Anesthesiology

## 2018-08-09 DIAGNOSIS — Z7984 Long term (current) use of oral hypoglycemic drugs: Secondary | ICD-10-CM | POA: Insufficient documentation

## 2018-08-09 DIAGNOSIS — E119 Type 2 diabetes mellitus without complications: Secondary | ICD-10-CM | POA: Diagnosis not present

## 2018-08-09 DIAGNOSIS — K5790 Diverticulosis of intestine, part unspecified, without perforation or abscess without bleeding: Secondary | ICD-10-CM | POA: Insufficient documentation

## 2018-08-09 DIAGNOSIS — Z7982 Long term (current) use of aspirin: Secondary | ICD-10-CM | POA: Insufficient documentation

## 2018-08-09 DIAGNOSIS — Z79899 Other long term (current) drug therapy: Secondary | ICD-10-CM | POA: Diagnosis not present

## 2018-08-09 DIAGNOSIS — K5900 Constipation, unspecified: Secondary | ICD-10-CM | POA: Diagnosis not present

## 2018-08-09 DIAGNOSIS — E7800 Pure hypercholesterolemia, unspecified: Secondary | ICD-10-CM | POA: Insufficient documentation

## 2018-08-09 DIAGNOSIS — E785 Hyperlipidemia, unspecified: Secondary | ICD-10-CM | POA: Insufficient documentation

## 2018-08-09 DIAGNOSIS — K635 Polyp of colon: Secondary | ICD-10-CM | POA: Insufficient documentation

## 2018-08-09 DIAGNOSIS — I10 Essential (primary) hypertension: Secondary | ICD-10-CM | POA: Insufficient documentation

## 2018-08-09 DIAGNOSIS — Z1211 Encounter for screening for malignant neoplasm of colon: Secondary | ICD-10-CM | POA: Diagnosis present

## 2018-08-09 HISTORY — PX: COLONOSCOPY WITH PROPOFOL: SHX5780

## 2018-08-09 LAB — GLUCOSE, CAPILLARY: GLUCOSE-CAPILLARY: 124 mg/dL — AB (ref 70–99)

## 2018-08-09 SURGERY — COLONOSCOPY WITH PROPOFOL
Anesthesia: General

## 2018-08-09 MED ORDER — PROPOFOL 10 MG/ML IV BOLUS
INTRAVENOUS | Status: DC | PRN
  Administered 2018-08-09: 50 mg via INTRAVENOUS

## 2018-08-09 MED ORDER — LIDOCAINE HCL (PF) 1 % IJ SOLN
INTRAMUSCULAR | Status: AC
  Administered 2018-08-09: 0.2 mL
  Filled 2018-08-09: qty 2

## 2018-08-09 MED ORDER — PROPOFOL 10 MG/ML IV BOLUS
INTRAVENOUS | Status: AC
  Filled 2018-08-09: qty 20

## 2018-08-09 MED ORDER — LIDOCAINE HCL (CARDIAC) PF 100 MG/5ML IV SOSY
PREFILLED_SYRINGE | INTRAVENOUS | Status: DC | PRN
  Administered 2018-08-09: 50 mg via INTRAVENOUS

## 2018-08-09 MED ORDER — SODIUM CHLORIDE 0.9 % IV SOLN
INTRAVENOUS | Status: DC
  Administered 2018-08-09: 1000 mL via INTRAVENOUS

## 2018-08-09 MED ORDER — PROPOFOL 500 MG/50ML IV EMUL
INTRAVENOUS | Status: DC | PRN
  Administered 2018-08-09: 150 ug/kg/min via INTRAVENOUS

## 2018-08-09 NOTE — Op Note (Signed)
[facility] Patient Name: Tamara [name] Procedure Date: 08/09/2018 3:21 PM MRN: [id number] Account #: [id number] Date of Birth: [birth year] Admit Type: Outpatient Age: 78 Room: [facility] ENDO ROOM 2 Gender: Female Note Status: Finalized Procedure:            Colonoscopy Indications:          Screening for colorectal malignant neoplasm Providers:            Christena Deem, MD Referring MD:         Silas Flood. Ellsworth Lennox, MD (Referring MD) Medicines:            Monitored Anesthesia Care Complications:        No immediate complications. Procedure:            Pre-Anesthesia Assessment:                       - ASA Grade Assessment: II - A patient with mild                        systemic disease.                       After obtaining informed consent, the colonoscope was                        passed under direct vision. Throughout the procedure,                        the patient's blood pressure, pulse, and oxygen                        saturations were monitored continuously. The                        Colonoscope was introduced through the anus and                        advanced to the the cecum, identified by appendiceal                        orifice and ileocecal valve. The colonoscopy was                        performed without difficulty. The patient tolerated the                        procedure well. The quality of the bowel preparation                        was fair. Findings:      A 2 mm polyp was found in the descending colon. The polyp was sessile.       The polyp was removed with a cold biopsy forceps. Resection and       retrieval were complete.      A few small-mouthed diverticula were found in the sigmoid colon and       descending colon.      The retroflexed view of the distal rectum and anal verge was normal and       showed no anal or rectal abnormalities.      The digital rectal exam was normal.  Impression:           -  Preparation of the colon was fair.                       - One 2 mm polyp in the descending colon, removed with                        a cold biopsy forceps. Resected and retrieved.                       - Diverticulosis in the sigmoid colon and in the                        descending colon.                       - The distal rectum and anal verge are normal on                        retroflexion view. Recommendation:       - Discharge patient to home.                       - Advance diet as tolerated.                       - Await pathology results.                       - Telephone GI clinic for pathology results in 1 week. Procedure Code(s):    --- Professional ---                       [phone], Colonoscopy, flexible; with biopsy, single or                        multiple Diagnosis Code(s):    --- Professional ---                       Z12.11, Encounter for screening for malignant neoplasm                        of colon                       D12.4, Benign neoplasm of descending colon                       K57.30, Diverticulosis of large intestine without                        perforation or abscess without bleeding CPT copyright 2018 American Medical Association. All rights reserved. The codes documented in this report are preliminary and upon coder review may  be revised to meet current compliance requirements. Christena Deem, MD 08/09/2018 4:07:10 PM This report has been signed electronically. Number of Addenda: 0 Note Initiated On: 08/09/2018 3:21 PM Scope Withdrawal Time: 0 hours 7 minutes 20 seconds  Total Procedure Duration: 0 hours 18 minutes 1 second       [facility]

## 2018-08-09 NOTE — Anesthesia Procedure Notes (Signed)
Date/Time: 08/09/2018 3:25 PM Performed by: Ginger Carne, CRNA Pre-anesthesia Checklist: Patient identified, Emergency Drugs available, Suction available and Patient being monitored Patient Re-evaluated:Patient Re-evaluated prior to induction Oxygen Delivery Method: Nasal cannula Preoxygenation: Pre-oxygenation with 100% oxygen Induction Type: IV induction

## 2018-08-09 NOTE — Anesthesia Postprocedure Evaluation (Signed)
Anesthesia Post Note  Patient: Tamara [name]  Procedure(s) Performed: COLONOSCOPY WITH PROPOFOL (N/A )  Patient location during evaluation: Endoscopy Anesthesia Type: General Level of consciousness: awake and alert Pain management: pain level controlled Vital Signs Assessment: post-procedure vital signs reviewed and stable Respiratory status: spontaneous breathing, nonlabored ventilation, respiratory function stable and patient connected to nasal cannula oxygen Cardiovascular status: blood pressure returned to baseline and stable Postop Assessment: no apparent nausea or vomiting Anesthetic complications: no     Last Vitals:  Vitals:   08/09/18 1613 08/09/18 1623  BP: 135/69 (!) 149/77  Pulse: 86 (!) 58  Resp: 15 12  Temp:    SpO2: 98% 97%    Last Pain:  Vitals:   08/09/18 1623  TempSrc:   PainSc: 0-No pain                 Lenard Simmer

## 2018-08-09 NOTE — H&P (Signed)
Outpatient short stay form Pre-procedure 08/09/2018 3:15 PM Tamara [name] Gurpreet Mikhail MD  Primary Physician: Dr Bluford MainSheikh Tejan-Sie  Reason for visit: Colonoscopy  History of present illness: Patient is a 78 year old female presenting today for colonoscopy in regards colon cancer screening.  Her last colonoscopy was in 2008 with no findings at that time.  Already her prep well.  She takes no aspirin or blood thinner with the exception of 81 mg aspirin that was held today.  She does have some problems with constipation but no abdominal pain or rectal bleeding.    Current Facility-Administered Medications:  .  0.9 %  sodium chloride infusion, , Intravenous, Continuous, Tamara [name], Tamara Hertenstein U, MD, Last Rate: 20 mL/hr at 08/09/18 1317, 1,000 mL at 08/09/18 1317  Medications Prior to Admission  Medication Sig Dispense Refill Last Dose  . atenolol (TENORMIN) 25 MG tablet Take by mouth.   08/08/2018 at Unknown time  . amLODipine-olmesartan (AZOR) 10-40 MG tablet    Taking  . aspirin 81 MG tablet Take 81 mg by mouth daily.   Taking  . atenolol-chlorthalidone (TENORETIC) 100-25 MG tablet   2 Taking  . bifidobacterium infantis (ALIGN) capsule Take by mouth.   Taking  . Cholecalciferol (VITAMIN D-1000 MAX ST) 1000 units tablet Take 1,000 Units by mouth daily.    Taking  . diazepam (VALIUM) 2 MG tablet 1 tablet every 8 hours as needed for muscles. 9 tablet 0 Taking  . hydrOXYzine (VISTARIL) 25 MG capsule   0 Taking  . metFORMIN (GLUCOPHAGE) 500 MG tablet Take 500 mg by mouth daily with breakfast.    Taking  . simvastatin (ZOCOR) 10 MG tablet Take 10 mg by mouth daily at 6 PM.    Taking  . UNKNOWN TO PATIENT Another HTN combo med   Taking     Allergies  Allergen Reactions  . Cefoxitin Diarrhea     Past Medical History:  Diagnosis Date  . Diabetes mellitus without complication (HCC)   . Hypercholesteremia   . Hyperlipidemia   . Hypertension     Review of systems:      Physical Exam    Heart and  lungs: Rhythm without rub or gallop, lungs are bilaterally clear.    HEENT: Normocephalic atraumatic eyes are anicteric    Other:    Pertinant exam for procedure: Soft nontender nondistended bowel sounds positive normoactive.    Planned proceedures: Colonoscopy and indicated procedures. I have discussed the risks benefits and complications of procedures to include not limited to bleeding, infection, perforation and the risk of sedation and the patient wishes to proceed.    Tamara [name] Marke Goodwyn, MD Gastroenterology 08/09/2018  3:15 PM

## 2018-08-09 NOTE — Anesthesia Post-op Follow-up Note (Signed)
Anesthesia QCDR form completed.        

## 2018-08-09 NOTE — Anesthesia Preprocedure Evaluation (Signed)
Anesthesia Evaluation  Patient identified by MRN, date of birth, ID band Patient awake    Reviewed: Allergy & Precautions, H&P , NPO status , Patient's Chart, lab work & pertinent test results, reviewed documented beta blocker date and time   Airway Mallampati: II   Neck ROM: full    Dental  (+) Poor Dentition   Pulmonary neg pulmonary ROS,    Pulmonary exam normal        Cardiovascular hypertension, negative cardio ROS Normal cardiovascular exam Rhythm:regular Rate:Normal     Neuro/Psych negative neurological ROS  negative psych ROS   GI/Hepatic negative GI ROS, Neg liver ROS,   Endo/Other  negative endocrine ROSdiabetes  Renal/GU negative Renal ROS  negative genitourinary   Musculoskeletal   Abdominal   Peds  Hematology negative hematology ROS (+)   Anesthesia Other Findings Past Medical History: No date: Diabetes mellitus without complication (HCC) No date: Hypercholesteremia No date: Hyperlipidemia No date: Hypertension Past Surgical History: No date: APPENDECTOMY No date: EYE SURGERY   Reproductive/Obstetrics negative OB ROS                             Anesthesia Physical Anesthesia Plan  ASA: II  Anesthesia Plan: General   Post-op Pain Management:    Induction:   PONV Risk Score and Plan:   Airway Management Planned:   Additional Equipment:   Intra-op Plan:   Post-operative Plan:   Informed Consent: I have reviewed the patients History and Physical, chart, labs and discussed the procedure including the risks, benefits and alternatives for the proposed anesthesia with the patient or authorized representative who has indicated his/her understanding and acceptance.     Dental Advisory Given  Plan Discussed with: CRNA  Anesthesia Plan Comments:         Anesthesia Quick Evaluation

## 2018-08-09 NOTE — Transfer of Care (Signed)
Immediate Anesthesia Transfer of Care Note  Patient: Tamara [name]  Procedure(s) Performed: COLONOSCOPY WITH PROPOFOL (N/A )  Patient Location: PACU  Anesthesia Type:General  Level of Consciousness: awake and alert   Airway & Oxygen Therapy: Patient Spontanous Breathing and Patient connected to nasal cannula oxygen  Post-op Assessment: Report given to RN and Post -op Vital signs reviewed and stable  Post vital signs: Reviewed and stable  Last Vitals:  Vitals Value Taken Time  BP 86/67 08/09/2018  3:53 PM  Temp 36.2 C 08/09/2018  3:53 PM  Pulse 58 08/09/2018  3:53 PM  Resp 10 08/09/2018  3:53 PM  SpO2 99 % 08/09/2018  3:53 PM    Last Pain:  Vitals:   08/09/18 1553  TempSrc: Tympanic  PainSc: Asleep         Complications: No apparent anesthesia complications

## 2018-08-12 ENCOUNTER — Encounter: Payer: Self-pay | Admitting: Gastroenterology

## 2018-08-13 ENCOUNTER — Ambulatory Visit
Admission: RE | Admit: 2018-08-13 | Discharge: 2018-08-13 | Disposition: A | Discharge status: Home / Self Care | Payer: Medicare Other | Source: Ambulatory Visit | Attending: Internal Medicine | Admitting: Internal Medicine

## 2018-08-13 DIAGNOSIS — Z1231 Encounter for screening mammogram for malignant neoplasm of breast: Secondary | ICD-10-CM | POA: Diagnosis not present

## 2018-08-13 LAB — SURGICAL PATHOLOGY

## 2018-08-15 ENCOUNTER — Other Ambulatory Visit: Payer: Self-pay | Admitting: Internal Medicine

## 2018-08-15 DIAGNOSIS — N6489 Other specified disorders of breast: Secondary | ICD-10-CM

## 2018-08-15 DIAGNOSIS — R928 Other abnormal and inconclusive findings on diagnostic imaging of breast: Secondary | ICD-10-CM

## 2018-08-22 ENCOUNTER — Ambulatory Visit
Admission: RE | Admit: 2018-08-22 | Discharge: 2018-08-22 | Disposition: A | Discharge status: Home / Self Care | Payer: Medicare Other | Source: Ambulatory Visit | Attending: Internal Medicine | Admitting: Internal Medicine

## 2018-08-22 DIAGNOSIS — R928 Other abnormal and inconclusive findings on diagnostic imaging of breast: Secondary | ICD-10-CM | POA: Diagnosis present

## 2018-08-22 DIAGNOSIS — N6489 Other specified disorders of breast: Secondary | ICD-10-CM

## 2018-09-04 ENCOUNTER — Other Ambulatory Visit: Payer: Self-pay | Admitting: Internal Medicine

## 2018-09-04 DIAGNOSIS — R599 Enlarged lymph nodes, unspecified: Secondary | ICD-10-CM

## 2018-09-10 ENCOUNTER — Ambulatory Visit
Admission: RE | Admit: 2018-09-10 | Discharge: 2018-09-10 | Disposition: A | Discharge status: Home / Self Care | Payer: Medicare Other | Source: Ambulatory Visit | Attending: Internal Medicine | Admitting: Internal Medicine

## 2018-09-10 ENCOUNTER — Other Ambulatory Visit: Payer: Self-pay

## 2018-09-10 DIAGNOSIS — R599 Enlarged lymph nodes, unspecified: Secondary | ICD-10-CM | POA: Diagnosis not present

## 2018-10-08 ENCOUNTER — Ambulatory Visit: Payer: Medicare Other | Admitting: Podiatry

## 2018-10-29 ENCOUNTER — Ambulatory Visit: Payer: Medicare Other | Admitting: Podiatry

## 2018-11-26 ENCOUNTER — Ambulatory Visit: Payer: Medicare Other | Admitting: Podiatry

## 2018-12-24 ENCOUNTER — Other Ambulatory Visit: Payer: Self-pay

## 2018-12-24 ENCOUNTER — Ambulatory Visit: Payer: Medicare Other | Admitting: Podiatry

## 2018-12-24 VITALS — Temp 97.3°F

## 2018-12-24 DIAGNOSIS — M79676 Pain in unspecified toe(s): Secondary | ICD-10-CM

## 2018-12-24 DIAGNOSIS — L989 Disorder of the skin and subcutaneous tissue, unspecified: Secondary | ICD-10-CM

## 2018-12-24 DIAGNOSIS — B351 Tinea unguium: Secondary | ICD-10-CM

## 2018-12-24 DIAGNOSIS — E0842 Diabetes mellitus due to underlying condition with diabetic polyneuropathy: Secondary | ICD-10-CM | POA: Diagnosis not present

## 2018-12-26 NOTE — Progress Notes (Signed)
    Subjective: Patient is a 78 y.o. female presenting to the office today with a chief complaint of painful callus lesions noted to the bilateral feet that have been symptomatic for the past few weeks. Walking and bearing weight increases the pain. She has not done anything for treatment at home.  Patient also complains of elongated, thickened nails that cause pain while ambulating in shoes. She is unable to trim her own nails. Patient presents today for further treatment and evaluation.  Past Medical History:  Diagnosis Date  . Diabetes mellitus without complication ([place])   . Hypercholesteremia   . Hyperlipidemia   . Hypertension     Objective:  Physical Exam General: Alert and oriented x3 in no acute distress  Dermatology: Hyperkeratotic lesions present on the bilateral feet. Pain on palpation with a central nucleated core noted. Skin is warm, dry and supple bilateral lower extremities. Negative for open lesions or macerations. Nails are tender, long, thickened and dystrophic with subungual debris, consistent with onychomycosis, 1-5 bilateral. No signs of infection noted.  Vascular: Palpable pedal pulses bilaterally. No edema or erythema noted. Capillary refill within normal limits.  Neurological: Epicritic and protective threshold diminished bilaterally.   Musculoskeletal Exam: Pain on palpation at the keratotic lesion noted. Range of motion within normal limits bilateral. Muscle strength 5/5 in all groups bilateral.  Assessment: 1. Onychodystrophic nails 1-5 bilateral with hyperkeratosis of nails.  2. 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 Pre-ulcerative callus lesions noted to the bilateral feet x 2   Plan of Care:  1. Patient evaluated. 2. Excisional debridement of keratoic lesion using a chisel blade was performed without incident.  3. Dressed with light dressing. 4. Mechanical debridement of nails 1-5 bilaterally performed using a nail nipper. Filed with  dremel without incident.  5. Patient is to return to the clinic in 3 months.   Edrick Kins, DPM [facility]  Dr. Edrick Kins, [address]                                        [place], [place] [address]                Office [phone]  Fax [phone]

## 2019-01-23 ENCOUNTER — Other Ambulatory Visit: Payer: Self-pay

## 2019-01-23 ENCOUNTER — Encounter: Payer: Self-pay | Admitting: Obstetrics and Gynecology

## 2019-01-23 ENCOUNTER — Ambulatory Visit (INDEPENDENT_AMBULATORY_CARE_PROVIDER_SITE_OTHER): Payer: Medicare Other | Admitting: Obstetrics and Gynecology

## 2019-01-23 VITALS — BP 130/80 | Ht 65.0 in | Wt 161.8 lb

## 2019-01-23 DIAGNOSIS — L989 Disorder of the skin and subcutaneous tissue, unspecified: Secondary | ICD-10-CM | POA: Diagnosis not present

## 2019-01-23 NOTE — Patient Instructions (Signed)
I value your feedback and entrusting us with your care. If you get a [facility] patient survey, I would appreciate you taking the time to let us know about your experience today. Thank you! 

## 2019-01-23 NOTE — Progress Notes (Signed)
Tamara Marble, MD   Chief Complaint  Patient presents with  . Back lump    painful lump in between buttocks area since Tuesday    HPI:      [name] is a 78 y.o. G2P0 who LMP was No LMP recorded. Patient is postmenopausal., presents today for lesion near gluteal cleft/LT buttocks. Pt noticed burning sensation 4 days ago, then felt bump 3 days ago. Has used warm compresses. No change in lump size since first noticed. No drainage as far as pt can tell. No fevers, no other lesions.  Pt with DM.    Past Medical History:  Diagnosis Date  . Diabetes mellitus without complication ([place])   . Hypercholesteremia   . Hyperlipidemia   . Hypertension     Past Surgical History:  Procedure Laterality Date  . APPENDECTOMY    . COLONOSCOPY WITH PROPOFOL N/A 08/09/2018   Procedure: COLONOSCOPY WITH PROPOFOL;  Surgeon: Lollie Sails, MD;  [facility] END[facility]COPY;  Service: Endoscopy;  Laterality: N/A;  . EYE SURGERY      Family History  Problem Relation Age of Onset  . Diabetes type II Brother   . Hypertension Brother   . Bladder Cancer Neg Hx   . Kidney cancer Neg Hx   . Breast cancer Neg Hx     Social History   Socioeconomic History  . Marital status: Widowed    Spouse name: Not on file  . Number of children: Not on file  . Years of education: Not on file  . Highest education level: Not on file  Occupational History  . Not on file  Social Needs  . Financial resource strain: Not on file  . Food insecurity    Worry: Not on file    Inability: Not on file  . Transportation needs    Medical: Not on file    Non-medical: Not on file  Tobacco Use  . Smoking status: Never Smoker  . Smokeless tobacco: Never Used  Substance and Sexual Activity  . Alcohol use: No  . Drug use: Never  . Sexual activity: Not Currently    Birth control/protection: Post-menopausal  Lifestyle  . Physical activity    Days per week: Not on file    Minutes per session: Not on  file  . Stress: Not on file  Relationships  . Social Herbalist on phone: Not on file    Gets together: Not on file    Attends religious service: Not on file    Active member of club or organization: Not on file    Attends meetings of clubs or organizations: Not on file    Relationship status: Not on file  . Intimate partner violence    Fear of current or ex partner: Not on file    Emotionally abused: Not on file    Physically abused: Not on file    Forced sexual activity: Not on file  Other Topics Concern  . Not on file  Social History Narrative  . Not on file    Outpatient Medications Prior to Visit  Medication Sig Dispense Refill  . amLODipine-olmesartan (AZOR) 10-40 MG tablet     . aspirin 81 MG tablet Take 81 mg by mouth daily.    Marland Kitchen atenolol-chlorthalidone (TENORETIC) 100-25 MG tablet   2  . bifidobacterium infantis (ALIGN) capsule Take by mouth.    . Cholecalciferol (VITAMIN D-1000 MAX ST) 1000 units tablet Take 1,000 Units by mouth  daily.     . metFORMIN (GLUCOPHAGE) 500 MG tablet Take 500 mg by mouth daily with breakfast.     . simvastatin (ZOCOR) 10 MG tablet Take 10 mg by mouth daily at 6 PM.     . atenolol (TENORMIN) 25 MG tablet Take by mouth.    . baclofen (LIORESAL) 10 MG tablet     . diazepam (VALIUM) 2 MG tablet 1 tablet every 8 hours as needed for muscles. (Patient not taking: Reported on 01/23/2019) 9 tablet 0  . hydrOXYzine (VISTARIL) 25 MG capsule   0  . meloxicam (MOBIC) 7.5 MG tablet TAKE 1 TABLET BY MOUTH EVERY DAY WITH FOOD AS NEEDED FOR PAIN    . UNKNOWN TO PATIENT Another HTN combo med     No facility-administered medications prior to visit.      ROS:  Review of Systems  Constitutional: Negative for fever.  Gastrointestinal: Negative for blood in stool, constipation, diarrhea, nausea and vomiting.  Genitourinary: Negative for dyspareunia, dysuria, flank pain, frequency, hematuria, urgency, vaginal bleeding, vaginal discharge and vaginal  pain.  Musculoskeletal: Negative for back pain.  Skin: Negative for rash.    OBJECTIVE:   Vitals:  BP 130/80   Ht 5\' 5"  (1.651 m)   Wt 161 lb 12.8 oz (73.4 kg)   BMI 26.92 kg/m   Physical Exam Vitals signs reviewed.  Constitutional:      Appearance: She is well-developed.  Neck:     Musculoskeletal: Normal range of motion.  Pulmonary:     Effort: Pulmonary effort is normal.  Musculoskeletal: Normal range of motion.  Skin:    General: Skin is warm and dry.     Findings: Lesion present.       Neurological:     General: No focal deficit present.     Mental Status: She is alert and oriented to person, place, and time.     Cranial Nerves: No cranial nerve deficit.  Psychiatric:        Mood and Affect: Mood normal.        Behavior: Behavior normal.        Thought Content: Thought content normal.        Judgment: Judgment normal.     Assessment/Plan: Skin lesion - Plan: Lt buttocks. Question etiology.Try triple abx oint/warm compresses. F/u if sx persist. No evid of shingles, doesn't look herpetic. Reassurance.    Return if symptoms worsen or fail to improve.  Christos Mixson B. Shavone Nevers, PA-C 01/23/2019 11:54 AM

## 2019-02-10 ENCOUNTER — Telehealth: Payer: Self-pay

## 2019-02-10 NOTE — Telephone Encounter (Signed)
Pt aware.

## 2019-02-10 NOTE — Telephone Encounter (Signed)
Pt saw [facility] 01/23/2019 for a bump on her bottom. She states for 3-4 days she didn't know it was there, but yesterday it was different, she could hardly sit down. She is reporting per [facility] instructions & hopes to here back. AY#[phone]

## 2019-02-10 NOTE — Telephone Encounter (Signed)
She needs to f/u with PCP since not a GYN issue. May need abx vs I&D. Thx

## 2019-02-17 ENCOUNTER — Encounter: Payer: Self-pay | Admitting: Physician Assistant

## 2019-02-17 ENCOUNTER — Telehealth: Payer: Self-pay | Admitting: Urology

## 2019-02-17 ENCOUNTER — Ambulatory Visit (INDEPENDENT_AMBULATORY_CARE_PROVIDER_SITE_OTHER): Payer: Medicare Other | Admitting: Physician Assistant

## 2019-02-17 ENCOUNTER — Other Ambulatory Visit: Payer: Self-pay

## 2019-02-17 VITALS — BP 118/74 | HR 60 | Ht 65.0 in | Wt 162.0 lb

## 2019-02-17 DIAGNOSIS — R3129 Other microscopic hematuria: Secondary | ICD-10-CM | POA: Diagnosis not present

## 2019-02-17 DIAGNOSIS — R309 Painful micturition, unspecified: Secondary | ICD-10-CM | POA: Diagnosis not present

## 2019-02-17 LAB — URINALYSIS, COMPLETE
Bilirubin, UA: NEGATIVE
Glucose, UA: NEGATIVE
Ketones, UA: NEGATIVE
Leukocytes,UA: NEGATIVE
Nitrite, UA: NEGATIVE
Protein,UA: NEGATIVE
Specific Gravity, UA: 1.015 (ref 1.005–1.030)
Urobilinogen, Ur: 0.2 mg/dL (ref 0.2–1.0)
pH, UA: 6.5 (ref 5.0–7.5)

## 2019-02-17 LAB — MICROSCOPIC EXAMINATION: Epithelial Cells (non renal): NONE SEEN /hpf (ref 0–10)

## 2019-02-17 NOTE — Telephone Encounter (Signed)
Pt seen in clinic today.  

## 2019-02-17 NOTE — Progress Notes (Signed)
02/17/2019 4:54 PM   Tamara [name] June 19, 1941 [id number]  CC: Dysuria, left-sided vaginal pain  HPI: Tamara GunningMartha M [name] is a 78 y.o. female who presents today for evaluation of possible UTI. She is an established [facility] patient who last saw Dr. Sherron MondayMacDiarmid on 03/25/2018 for annual follow-up of recurrent UTI.  He states that she sought care with her primary care provider late last week for a "bump on her bottom."  She was prescribed Augmentin 500-125mg  BID x7 days.  He reports that on Saturday, she experienced perianal burning that worsened with urination and pain on the left anterior inguinal crease.  She took Tylenol at home with complete resolution of symptoms.  She is not in pain today.  She reports some intermittent constipation but states she had a bowel movement yesterday.  She denies dysuria, urinary urgency, and urinary frequency.  She takes nitrofurantoin and an OTC probiotic daily for UTI prophylaxis.  She reports her most recent breakthrough infection was "years ago". Her typical UTI symptoms include AMS and foul-smelling urine.  She has a history of growing Bactrim resistant E. coli per urine culture on 01/09/2017.  In-office UA today positive for 1+ blood; urine microscopy with 3-10 RBCs/HPF.  She is a never smoker and denies a family history of bladder or renal cancer.  PMH: Past Medical History:  Diagnosis Date  . Diabetes mellitus without complication (HCC)   . Hypercholesteremia   . Hyperlipidemia   . Hypertension     Surgical History: Past Surgical History:  Procedure Laterality Date  . APPENDECTOMY    . COLONOSCOPY WITH PROPOFOL N/A 08/09/2018   Procedure: COLONOSCOPY WITH PROPOFOL;  Surgeon: Christena DeemSkulskie, Martin U, MD;  [facility];  Service: Endoscopy;  Laterality: N/A;  . EYE SURGERY      Home Medications:  Allergies as of 02/17/2019      Reactions   Cefoxitin Diarrhea      Medication List       Accurate as of February 17, 2019  4:54 PM. If  you have any questions, ask your nurse or doctor.        amLODipine-olmesartan 10-40 MG tablet Commonly known as: AZOR   aspirin 81 MG tablet Take 81 mg by mouth daily.   atenolol 25 MG tablet Commonly known as: TENORMIN Take by mouth.   atenolol-chlorthalidone 100-25 MG tablet Commonly known as: TENORETIC   baclofen 10 MG tablet Commonly known as: LIORESAL   bifidobacterium infantis capsule Take by mouth.   diazepam 2 MG tablet Commonly known as: Valium 1 tablet every 8 hours as needed for muscles.   hydrOXYzine 25 MG capsule Commonly known as: VISTARIL   meloxicam 7.5 MG tablet Commonly known as: MOBIC TAKE 1 TABLET BY MOUTH EVERY DAY WITH FOOD AS NEEDED FOR PAIN   metFORMIN 500 MG tablet Commonly known as: GLUCOPHAGE Take 500 mg by mouth daily with breakfast.   simvastatin 10 MG tablet Commonly known as: ZOCOR Take 10 mg by mouth daily at 6 PM.   UNKNOWN TO PATIENT Another HTN combo med   Vitamin D-1000 Max St 25 MCG (1000 UT) tablet Generic drug: Cholecalciferol Take 1,000 Units by mouth daily.       Allergies:  Allergies  Allergen Reactions  . Cefoxitin Diarrhea    Family History: Family History  Problem Relation Age of Onset  . Diabetes type II Brother   . Hypertension Brother   . Bladder Cancer Neg Hx   . Kidney cancer Neg Hx   .  Breast cancer Neg Hx     Social History:   reports that she has never smoked. She has never used smokeless tobacco. She reports that she does not drink alcohol or use drugs.  ROS: UROLOGY Frequent Urination?: No Hard to postpone urination?: No Burning/pain with urination?: No Get up at night to urinate?: No Leakage of urine?: No Urine stream starts and stops?: No Trouble starting stream?: No Do you have to strain to urinate?: No Blood in urine?: No Urinary tract infection?: No Sexually transmitted disease?: No Injury to kidneys or bladder?: No Painful intercourse?: No Weak stream?: No Currently  pregnant?: No Vaginal bleeding?: No Last menstrual period?: n  Gastrointestinal Nausea?: No Vomiting?: No Indigestion/heartburn?: No Diarrhea?: No Constipation?: No  Constitutional Fever: No Night sweats?: No Weight loss?: No Fatigue?: No  Skin Skin rash/lesions?: No Itching?: No  Eyes Blurred vision?: No Double vision?: No  Ears/Nose/Throat Sore throat?: No Sinus problems?: No  Hematologic/Lymphatic Swollen glands?: No Easy bruising?: No  Cardiovascular Leg swelling?: No Chest pain?: No  Respiratory Cough?: No Shortness of breath?: No  Endocrine Excessive thirst?: No  Musculoskeletal Back pain?: No Joint pain?: No  Neurological Headaches?: No Dizziness?: No  Psychologic Depression?: No Anxiety?: No  Physical Exam: BP 118/74 (BP Location: Left Arm, Patient Position: Sitting, Cuff Size: Normal)   Pulse 60   Ht 5\' 5"  (1.651 m)   Wt 162 lb (73.5 kg)   BMI 26.96 kg/m   Constitutional:  Alert and oriented, no acute distress, nontoxic appearing HEENT: [place], AT Cardiovascular: No clubbing, cyanosis, or edema Respiratory: Normal respiratory effort, no increased work of breathing GU: Pale vaginal mucosa with urethral caruncle noted.  Minimal clear vaginal discharge.  Patient unable to identify the bump that she previously reported, however she indicated its general vicinity as being on the left buttock posterior to the vaginal introitus.  No areas of fluctuance or discharge noted.  No anal fissures visualized. MSK: No tenderness of the anterior hip joint Skin: No rashes, bruises or suspicious lesions Neurologic: Grossly intact, no focal deficits, moving all 4 extremities Psychiatric: Normal mood and affect  Laboratory Data: Results for orders placed or performed in visit on 02/17/19  Microscopic Examination   URINE  Result Value Ref Range   WBC, UA 0-5 0 - 5 /hpf   RBC 3-10 (A) 0 - 2 /hpf   Epithelial Cells (non renal) None seen 0 - 10 /hpf    Bacteria, UA Few None seen/Few  Urinalysis, Complete  Result Value Ref Range   Specific Gravity, UA 1.015 1.005 - 1.030   pH, UA 6.5 5.0 - 7.5   Color, UA Yellow Yellow   Appearance Ur Clear Clear   Leukocytes,UA Negative Negative   Protein,UA Negative Negative/Trace   Glucose, UA Negative Negative   Ketones, UA Negative Negative   RBC, UA 1+ (A) Negative   Bilirubin, UA Negative Negative   Urobilinogen, Ur 0.2 0.2 - 1.0 mg/dL   Nitrite, UA Negative Negative   Microscopic Examination See below:    Assessment & Plan:   1. Pain with urination Patient describes 2 pain sites today: one in the perirectal area, and another in the anterior left inguinal crease.  Perirectal pain worse with increased intra-abdominal pressure; inguinal pain on affected by intra-abdominal pressure and resolved with PO Tylenol.  I do not believe the patient to be describing dysuria suggestive of infection. UA reassuring for infection, will send for culture given known history of recurrent UTI.   I believe  her perirectal pain is related to the presumed gluteal abscess that is currently being treated by her PCP.  I expect this to resolve as she completes her prescribed antibiotics.  Additionally, patient has a known history of intermittent constipation which may be contributing to this.  I counseled her to utilize MiraLAX or Benefiber to aid in her constipation and increase her bowel regularity.    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  I am reassured that her symptoms resolved with p.o. Tylenol.  Overall, I counseled the patient that I do not believe there to be a urological cause for either of these sites of pain.  I counseled her that if the symptoms continue or worsen, she should follow-up with her primary care provider for further evaluation.  She expressed an understanding of this plan. -Urinalysis, Complete -Urine culture  2.  Microscopic hematuria  Incidental microscopic hematuria on UA today.  Patient has no known risk factors for urothelial cancer.  I counseled her that there is a wide array of etiologies for blood in the urine, including benign and malignant causes.  Of note, chart review reveals another episode of microscopic hematuria with 5 RBCs/HPF on 10/21/2011.  I would like her to complete her current round of antibiotics and return to clinic in 1 month to provide an additional urine sample for proof of resolution of microscopic hematuria.  If she has persistent hematuria at that time, I will order hematuria work-up for her. -RTC 1 month for repeat UA  Debroah Loop, [facility]  [place] [address], [place], [place] [address] [phone]

## 2019-02-17 NOTE — Telephone Encounter (Signed)
Pt called and states that has been having pain in the left side of her vagina and when she tries to urinate it is painful. Please advise.

## 2019-02-19 LAB — CULTURE, URINE COMPREHENSIVE

## 2019-03-19 ENCOUNTER — Other Ambulatory Visit: Payer: Self-pay

## 2019-03-19 DIAGNOSIS — R3129 Other microscopic hematuria: Secondary | ICD-10-CM

## 2019-03-20 ENCOUNTER — Other Ambulatory Visit: Payer: Medicare Other

## 2019-03-20 ENCOUNTER — Other Ambulatory Visit: Payer: Self-pay

## 2019-03-20 DIAGNOSIS — R3129 Other microscopic hematuria: Secondary | ICD-10-CM

## 2019-03-21 LAB — MICROSCOPIC EXAMINATION
Bacteria, UA: NONE SEEN
Epithelial Cells (non renal): NONE SEEN /hpf (ref 0–10)
RBC, Urine: NONE SEEN /hpf (ref 0–2)

## 2019-03-21 LAB — URINALYSIS, COMPLETE
Bilirubin, UA: NEGATIVE
Glucose, UA: NEGATIVE
Ketones, UA: NEGATIVE
Leukocytes,UA: NEGATIVE
Nitrite, UA: NEGATIVE
RBC, UA: NEGATIVE
Specific Gravity, UA: 1.02 (ref 1.005–1.030)
Urobilinogen, Ur: 0.2 mg/dL (ref 0.2–1.0)
pH, UA: 7 (ref 5.0–7.5)

## 2019-03-21 NOTE — Progress Notes (Signed)
Note resolution of MH following UTI tx.

## 2019-03-31 ENCOUNTER — Encounter: Payer: Self-pay | Admitting: Urology

## 2019-03-31 ENCOUNTER — Ambulatory Visit (INDEPENDENT_AMBULATORY_CARE_PROVIDER_SITE_OTHER): Payer: Medicare Other | Admitting: Urology

## 2019-03-31 ENCOUNTER — Other Ambulatory Visit: Payer: Self-pay

## 2019-03-31 VITALS — BP 148/97 | HR 67 | Ht 65.0 in | Wt 166.0 lb

## 2019-03-31 DIAGNOSIS — R3129 Other microscopic hematuria: Secondary | ICD-10-CM | POA: Diagnosis not present

## 2019-03-31 DIAGNOSIS — N3020 Other chronic cystitis without hematuria: Secondary | ICD-10-CM | POA: Diagnosis not present

## 2019-03-31 LAB — URINALYSIS, COMPLETE
Bilirubin, UA: NEGATIVE
Glucose, UA: NEGATIVE
Ketones, UA: NEGATIVE
Leukocytes,UA: NEGATIVE
Nitrite, UA: NEGATIVE
Protein,UA: NEGATIVE
RBC, UA: NEGATIVE
Specific Gravity, UA: 1.02 (ref 1.005–1.030)
Urobilinogen, Ur: 0.2 mg/dL (ref 0.2–1.0)
pH, UA: 7 (ref 5.0–7.5)

## 2019-03-31 LAB — MICROSCOPIC EXAMINATION
Bacteria, UA: NONE SEEN
RBC, Urine: NONE SEEN /hpf (ref 0–2)
WBC, UA: NONE SEEN /hpf (ref 0–5)

## 2019-03-31 MED ORDER — NITROFURANTOIN MONOHYD MACRO 100 MG PO CAPS: 100.0000 mg | ORAL_CAPSULE | Freq: Every day | ORAL | 11 refills | Status: DC

## 2019-03-31 NOTE — Progress Notes (Signed)
03/31/2019 10:54 AM   Tamara [name] [birth year] [id number]  Referring provider: [name], MD [address] [place],  [place] [address]  Chief Complaint  Patient presents with  . Hematuria    1year    HPI: The patient was discharged in July 2018 and had had acute encephalopathy with urinary tract infection and sepsis. Culture was positive. Her renal ultrasound in July was normal  I follow the patient in [place] and I have had her on daily trimethoprim for 2 or 3 years. She still gets approximate 2 infections a year and her symptoms were a little bit nonspecific. Her daughter from [facility] was here and there is no question that about every 2 year she can end up in the hospital with CNS symptoms and she often gets foul smelling urine. She believes her urine is a bit foul-smelling in the morning now.  She is continent. She voids every 2 or 3 hours and gets up once a night.  I thought it was best to send the urine for culture. I would like to treat her for a possible urinary tract infection and switch her to daily Macrodantin. Based upon the last sensitivity I called and ciprofloxacin 250 mg twice a day for 1 week  Today Patient had been on daily Macrodantin and then saw a nurse practitioner February 17, 2019.  She had been placed on Augmentin by primary care for a lesion in the pelvic inguinal crease on the left side.  She was on the Macrodantin.  Apparently was also having pain around the rectum.  Patient did have a positive urine culture and microscopic hematuria that day  I found the patient's history little bit challenging and she was not clear if she was on prophylaxis but she does take a probiotic.  She said the skin issues resolved.  She did not think she was clinically infected today.  Renal ultrasound 2018 normal  Today patient had no hematuria.     PMH: Past Medical History:  Diagnosis Date  . Diabetes mellitus without complication (HCC)   .  Hypercholesteremia   . Hyperlipidemia   . Hypertension     Surgical History: Past Surgical History:  Procedure Laterality Date  . APPENDECTOMY    . COLONOSCOPY WITH PROPOFOL N/A 08/09/2018   Procedure: COLONOSCOPY WITH PROPOFOL;  Surgeon: Christena Deem, MD;  [facility]: [facility] END[facility]COPY;  Service: Endoscopy;  Laterality: N/A;  . EYE SURGERY      Home Medications:  Allergies as of 03/31/2019      Reactions   Cefoxitin Diarrhea      Medication List       Accurate as of March 31, 2019 10:54 AM. If you have any questions, ask your nurse or doctor.        amLODipine-olmesartan 10-40 MG tablet Commonly known as: AZOR   aspirin 81 MG tablet Take 81 mg by mouth daily.   atenolol 25 MG tablet Commonly known as: TENORMIN Take by mouth.   atenolol-chlorthalidone 100-25 MG tablet Commonly known as: TENORETIC   baclofen 10 MG tablet Commonly known as: LIORESAL   bifidobacterium infantis capsule Take by mouth.   diazepam 2 MG tablet Commonly known as: Valium 1 tablet every 8 hours as needed for muscles.   hydrOXYzine 25 MG capsule Commonly known as: VISTARIL   meloxicam 7.5 MG tablet Commonly known as: MOBIC TAKE 1 TABLET BY MOUTH EVERY DAY WITH FOOD AS NEEDED FOR PAIN   metFORMIN 500 MG tablet Commonly known as:  GLUCOPHAGE Take 500 mg by mouth daily with breakfast.   nitrofurantoin (macrocrystal-monohydrate) 100 MG capsule Commonly known as: MACROBID   simvastatin 10 MG tablet Commonly known as: ZOCOR Take 10 mg by mouth daily at 6 PM.   UNKNOWN TO PATIENT Another HTN combo med   Vitamin D-1000 Max St 25 MCG (1000 UT) tablet Generic drug: Cholecalciferol Take 1,000 Units by mouth daily.       Allergies:  Allergies  Allergen Reactions  . Cefoxitin Diarrhea    Family History: Family History  Problem Relation Age of Onset  . Diabetes type II Brother   . Hypertension Brother   . Bladder Cancer Neg Hx   . Kidney cancer Neg Hx   . Breast  cancer Neg Hx     Social History:  reports that she has never smoked. She has never used smokeless tobacco. She reports that she does not drink alcohol or use drugs.  ROS: UROLOGY Frequent Urination?: No Hard to postpone urination?: No Burning/pain with urination?: No Get up at night to urinate?: No Leakage of urine?: No Urine stream starts and stops?: No Trouble starting stream?: No Do you have to strain to urinate?: No Blood in urine?: No Urinary tract infection?: No Sexually transmitted disease?: No Injury to kidneys or bladder?: No Painful intercourse?: No Weak stream?: No Currently pregnant?: No Vaginal bleeding?: No Last menstrual period?: n  Gastrointestinal Nausea?: No Vomiting?: No Indigestion/heartburn?: No Diarrhea?: No Constipation?: No  Constitutional Fever: No Night sweats?: No Weight loss?: No Fatigue?: No  Skin Skin rash/lesions?: No Itching?: No  Eyes Blurred vision?: No Double vision?: No  Ears/Nose/Throat Sore throat?: No Sinus problems?: No  Hematologic/Lymphatic Swollen glands?: No Easy bruising?: No  Cardiovascular Leg swelling?: No Chest pain?: No  Respiratory Cough?: No Shortness of breath?: No  Endocrine Excessive thirst?: No  Musculoskeletal Back pain?: No Joint pain?: No  Neurological Headaches?: No Dizziness?: No  Psychologic Depression?: No Anxiety?: No  Physical Exam: BP (!) 148/97   Pulse 67   Ht 5\' 5"  (1.651 m)   Wt 166 lb (75.3 kg)   BMI 27.62 kg/m   Constitutional:  Alert and oriented, No acute distress.   Laboratory Data: Lab Results  Component Value Date   WBC 6.3 03/02/2018   HGB 14.0 03/02/2018   HCT 39.7 03/02/2018   MCV 86.0 03/02/2018   PLT 274 03/02/2018    Lab Results  Component Value Date   CREATININE 1.17 (H) 03/02/2018    No results found for: PSA  No results found for: TESTOSTERONE  Lab Results  Component Value Date   HGBA1C 6.2 (H) 01/09/2017    Urinalysis     Component Value Date/Time   COLORURINE YELLOW (A) 01/09/2017 1725   APPEARANCEUR Hazy (A) 03/20/2019 1033   LABSPEC 1.010 06/23/2017 1907   LABSPEC 1.017 10/21/2011 1422   PHURINE 6.0 06/23/2017 1907   GLUCOSEU Negative 03/20/2019 1033   GLUCOSEU Negative 10/21/2011 1422   HGBUR TRACE (A) 06/23/2017 1907   BILIRUBINUR Negative 03/20/2019 1033   BILIRUBINUR Negative 10/21/2011 1422   KETONESUR NEGATIVE 06/23/2017 1907   PROTEINUR Trace (A) 03/20/2019 1033   PROTEINUR NEGATIVE 06/23/2017 1907   UROBILINOGEN 0.2 06/23/2017 1907   NITRITE Negative 03/20/2019 1033   NITRITE NEGATIVE 06/23/2017 1907   LEUKOCYTESUR Negative 03/20/2019 1033   LEUKOCYTESUR 2+ 10/21/2011 1422    Pertinent Imaging:   Assessment & Plan: The patient was cleared with a normal urinalysis.  She did have a positive culture on the  day.  She is on daily Macrodantin.  Prescription renewed and I will see in 1 year  1. Microscopic hematuria  - Urinalysis, Complete   No follow-ups on file.  Reece Packer, MD  [facility] [address], [place], [place] [address] [phone]

## 2019-04-01 ENCOUNTER — Ambulatory Visit: Payer: Medicare Other | Admitting: Podiatry

## 2019-04-08 ENCOUNTER — Ambulatory Visit: Payer: Medicare Other | Admitting: Podiatry

## 2019-04-25 ENCOUNTER — Encounter: Payer: Self-pay | Admitting: Podiatry

## 2019-04-25 ENCOUNTER — Ambulatory Visit: Payer: Medicare Other | Admitting: Podiatry

## 2019-04-25 ENCOUNTER — Other Ambulatory Visit: Payer: Self-pay

## 2019-04-25 DIAGNOSIS — E0842 Diabetes mellitus due to underlying condition with diabetic polyneuropathy: Secondary | ICD-10-CM | POA: Diagnosis not present

## 2019-04-25 DIAGNOSIS — L989 Disorder of the skin and subcutaneous tissue, unspecified: Secondary | ICD-10-CM

## 2019-04-25 DIAGNOSIS — B351 Tinea unguium: Secondary | ICD-10-CM | POA: Diagnosis not present

## 2019-04-25 DIAGNOSIS — M79676 Pain in unspecified toe(s): Secondary | ICD-10-CM | POA: Diagnosis not present

## 2019-04-28 NOTE — Progress Notes (Signed)
    Subjective: Patient is a 78 y.o. female presenting to the office today for follow up evaluation of painful callus lesions noted to the bilateral feet. Walking and bearing weight increases the pain. She has not done anything for treatment at home.  Patient also complains of elongated, thickened nails that cause pain while ambulating in shoes. She is unable to trim her own nails. Patient presents today for further treatment and evaluation.  Past Medical History:  Diagnosis Date  . Diabetes mellitus without complication ([place])   . Hypercholesteremia   . Hyperlipidemia   . Hypertension     Objective:  Physical Exam General: Alert and oriented x3 in no acute distress  Dermatology: Hyperkeratotic lesions present on the bilateral feet. Pain on palpation with a central nucleated core noted. Skin is warm, dry and supple bilateral lower extremities. Negative for open lesions or macerations. Nails are tender, long, thickened and dystrophic with subungual debris, consistent with onychomycosis, 1-5 bilateral. No signs of infection noted.  Vascular: Palpable pedal pulses bilaterally. No edema or erythema noted. Capillary refill within normal limits.  Neurological: Epicritic and protective threshold diminished bilaterally.   Musculoskeletal Exam: Pain on palpation at the keratotic lesion noted. Range of motion within normal limits bilateral. Muscle strength 5/5 in all groups bilateral.  Assessment: 1. Onychodystrophic nails 1-5 bilateral with hyperkeratosis of nails.  2. 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 Pre-ulcerative callus lesions noted to the bilateral feet x 2   Plan of Care:  1. Patient evaluated. 2. Excisional debridement of keratoic lesion using a chisel blade was performed without incident.  3. Dressed with light dressing. 4. Mechanical debridement of nails 1-5 bilaterally performed using a nail nipper. Filed with dremel without incident.  5. Patient is to  return to the clinic in 3 months.   Edrick Kins, DPM [facility]  Dr. Edrick Kins, [place]                                        [place], [place] [address]                Office [phone]  Fax [phone]

## 2019-08-26 ENCOUNTER — Ambulatory Visit: Payer: Medicare Other | Admitting: Podiatry

## 2019-09-08 ENCOUNTER — Other Ambulatory Visit: Payer: Self-pay | Admitting: Internal Medicine

## 2019-09-08 DIAGNOSIS — Z1231 Encounter for screening mammogram for malignant neoplasm of breast: Secondary | ICD-10-CM

## 2019-09-19 ENCOUNTER — Ambulatory Visit: Payer: Medicare PPO | Admitting: Podiatry

## 2019-09-30 ENCOUNTER — Other Ambulatory Visit: Payer: Self-pay

## 2019-09-30 ENCOUNTER — Encounter: Payer: Self-pay | Admitting: Podiatry

## 2019-09-30 ENCOUNTER — Ambulatory Visit: Payer: Medicare PPO | Admitting: Podiatry

## 2019-09-30 VITALS — Temp 97.5°F

## 2019-09-30 DIAGNOSIS — L989 Disorder of the skin and subcutaneous tissue, unspecified: Secondary | ICD-10-CM

## 2019-09-30 DIAGNOSIS — B351 Tinea unguium: Secondary | ICD-10-CM | POA: Diagnosis not present

## 2019-09-30 DIAGNOSIS — E0842 Diabetes mellitus due to underlying condition with diabetic polyneuropathy: Secondary | ICD-10-CM

## 2019-09-30 DIAGNOSIS — M79676 Pain in unspecified toe(s): Secondary | ICD-10-CM | POA: Diagnosis not present

## 2019-10-02 NOTE — Progress Notes (Signed)
    Subjective: Patient is a 79 y.o. female with PMHx of diabetes mellitus presenting to the office today for follow up evaluation of painful callus lesions noted to the bilateral feet. Walking and bearing weight increases the pain. She has not had any treatment at home.  Patient also complains of elongated, thickened nails that cause pain while ambulating in shoes. She is unable to trim her own nails. Patient presents today for further treatment and evaluation.  Past Medical History:  Diagnosis Date  . Diabetes mellitus without complication (HCC)   . Hypercholesteremia   . Hyperlipidemia   . Hypertension     Objective:  Physical Exam General: Alert and oriented x3 in no acute distress  Dermatology: Hyperkeratotic lesions present on the bilateral feet. Pain on palpation with a central nucleated core noted. Skin is warm, dry and supple bilateral lower extremities. Negative for open lesions or macerations. Nails are tender, long, thickened and dystrophic with subungual debris, consistent with onychomycosis, 1-5 bilateral. No signs of infection noted.  Vascular: Palpable pedal pulses bilaterally. No edema or erythema noted. Capillary refill within normal limits.  Neurological: Epicritic and protective threshold diminished bilaterally.   Musculoskeletal Exam: Pain on palpation at the keratotic lesion noted. Range of motion within normal limits bilateral. Muscle strength 5/5 in all groups bilateral.  Assessment: 1. Onychodystrophic nails 1-5 bilateral with hyperkeratosis of nails.  2. 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 Pre-ulcerative callus lesions noted to the bilateral feet x 2   Plan of Care:  1. Patient evaluated. 2. Excisional debridement of keratoic lesion using a chisel blade was performed without incident.  3. Dressed with light dressing. 4. Mechanical debridement of nails 1-5 bilaterally performed using a nail nipper. Filed with dremel without incident.   5. Patient is to return to the clinic in 3 months.   Felecia Shelling, DPM [facility]  Dr. Felecia Shelling, DPM    [address]                                        [place], [place] [address]                Office [phone]  Fax [phone]

## 2019-10-15 ENCOUNTER — Ambulatory Visit
Admission: RE | Admit: 2019-10-15 | Discharge: 2019-10-15 | Disposition: A | Discharge status: Home / Self Care | Payer: Medicare PPO | Source: Ambulatory Visit | Attending: Internal Medicine | Admitting: Internal Medicine

## 2019-10-15 DIAGNOSIS — Z1231 Encounter for screening mammogram for malignant neoplasm of breast: Secondary | ICD-10-CM | POA: Diagnosis present

## 2020-02-27 ENCOUNTER — Ambulatory Visit: Payer: Medicare PPO | Admitting: Podiatry

## 2020-03-01 ENCOUNTER — Ambulatory Visit: Payer: Medicare PPO | Admitting: Podiatry

## 2020-03-02 ENCOUNTER — Ambulatory Visit: Payer: Medicare PPO | Admitting: Podiatry

## 2020-03-02 ENCOUNTER — Other Ambulatory Visit: Payer: Self-pay

## 2020-03-02 DIAGNOSIS — M79675 Pain in left toe(s): Secondary | ICD-10-CM

## 2020-03-02 DIAGNOSIS — E0842 Diabetes mellitus due to underlying condition with diabetic polyneuropathy: Secondary | ICD-10-CM

## 2020-03-02 DIAGNOSIS — B351 Tinea unguium: Secondary | ICD-10-CM

## 2020-03-02 DIAGNOSIS — M79674 Pain in right toe(s): Secondary | ICD-10-CM | POA: Diagnosis not present

## 2020-03-02 NOTE — Progress Notes (Signed)
  Subjective:  Patient ID: Tamara [name], female    DOB: 1941-06-30,  MRN: [id number]  Chief Complaint  Patient presents with  . Nail Problem    Pt is requesting nail trimming today- due to having to wait long for appt- states no new issues- overall doing well     79 y.o. female presents with the above complaint. History confirmed with patient.   Objective:  Physical Exam: warm, good capillary refill, no trophic changes or ulcerative lesions, normal DP and PT pulses, reduced SW monofilament detection in toes, onychomycosis x10  Assessment:   1. Onychomycosis   2. Pain due to onychomycosis of toenails of both feet   3. Diabetes mellitus due to underlying condition with diabetic polyneuropathy, unspecified whether long term insulin use (HCC)      Plan:  Patient was evaluated and treated and all questions answered.   Patient educated on diabetes. Discussed proper diabetic foot care and discussed risks and complications of disease. Educated patient in depth on reasons to return to the office immediately should he/she discover anything concerning or new on the feet. All questions answered. Discussed proper shoes as well.   Discussed the etiology and treatment options for the condition in detail with the patient. Educated patient on the topical and oral treatment options for mycotic nails. Recommended debridement of the nails today. Sharp and mechanical debridement performed of all painful and mycotic nails today. Nails debrided in length and thickness using a nail nipper and a mechanical burr to level of comfort. Discussed treatment options including appropriate shoe gear. Follow up as needed for painful nails.   Return in about 3 months (around 06/01/2020) for [facility].

## 2020-04-05 ENCOUNTER — Encounter: Payer: Self-pay | Admitting: Urology

## 2020-04-05 ENCOUNTER — Ambulatory Visit: Payer: Medicare Other | Admitting: Urology

## 2020-05-03 ENCOUNTER — Ambulatory Visit: Payer: Medicare PPO | Admitting: Urology

## 2020-05-03 ENCOUNTER — Encounter: Payer: Self-pay | Admitting: Urology

## 2020-05-04 ENCOUNTER — Telehealth: Payer: Self-pay | Admitting: Urology

## 2020-05-04 MED ORDER — NITROFURANTOIN MONOHYD MACRO 100 MG PO CAPS: 100.0000 mg | ORAL_CAPSULE | Freq: Every day | ORAL | 0 refills | Status: DC

## 2020-05-04 NOTE — Telephone Encounter (Signed)
Pt dghtr asks for a refill of Nitrofurantoin mono-mcr 100mg  until pt can make it to her rescheduled appt in 2 weeks Pt daughter also asks to be called at [phone] and told that this has been taken care of and asks that if Dr. [phone] can't sent this in in a timely manner to have another provider in the office to send it. Please advise

## 2020-05-04 NOTE — Telephone Encounter (Signed)
Only two week supply sent to pharmacy. Pt has not been seen in over 1 year and no showed the last two appointments. Must keep her follow up appointment as scheduled.

## 2020-05-06 ENCOUNTER — Telehealth: Payer: Self-pay | Admitting: *Deleted

## 2020-05-06 NOTE — Telephone Encounter (Signed)
Attempted to return call-unable to leave message. Patient left voicemail regarding prescriptions-unable to fill till follow up with Dr. [name].

## 2020-05-17 ENCOUNTER — Ambulatory Visit: Payer: Medicare PPO | Admitting: Urology

## 2020-05-17 ENCOUNTER — Other Ambulatory Visit: Payer: Self-pay

## 2020-05-17 VITALS — BP 150/85 | HR 60

## 2020-05-17 DIAGNOSIS — N3020 Other chronic cystitis without hematuria: Secondary | ICD-10-CM

## 2020-05-17 MED ORDER — NITROFURANTOIN MONOHYD MACRO 100 MG PO CAPS: 100.0000 mg | ORAL_CAPSULE | Freq: Every day | ORAL | 11 refills | Status: DC

## 2020-05-17 NOTE — Progress Notes (Signed)
05/17/2020 10:15 AM   [name] [birth year] [id number]  Referring provider: [name], MD [address] [place],  [place] [address]  Chief Complaint  Patient presents with  . Cystitis    HPI: The patient was discharged in July 2018 and had had acute encephalopathy with urinary tract infection and sepsis. Culture was positive. Her renal ultrasound in July was normal  I follow the patient in [place] and I have had her on daily trimethoprim for 2 or 3 years. She still gets approximate 2 infections a year and her symptoms were a little bit nonspecific. Her daughter from [facility] was here and there is no question that about every 2 year she can end up in the hospital with CNS symptoms and she often gets foul smelling urine. She believes her urine is a bit foul-smelling in the morning now.  Patient had been on daily Macrodantin and then saw a nurse practitioner February 17, 2019.  She had been placed on Augmentin by primary care for a lesion in the pelvic inguinal crease on the left side.  She was on the Macrodantin.  Apparently was also having pain around the rectum.  Patient did have a positive urine culture and microscopic hematuria that day  I found the patient's history little bit challenging and she was not clear if she was on prophylaxis but she does take a probiotic.  The patient was cleared with a normal urinalysis.  She did have a positive culture on the day.  She is on daily Macrodantin.  Prescription renewed and I will see in 1 year  TOday Frequency stable.  Clinically not infected.  Infection free on daily Macrodantin.  No breakthroughs.   PMH: Past Medical History:  Diagnosis Date  . Diabetes mellitus without complication (HCC)   . Hypercholesteremia   . Hyperlipidemia   . Hypertension     Surgical History: Past Surgical History:  Procedure Laterality Date  . APPENDECTOMY    . COLONOSCOPY WITH PROPOFOL N/A 08/09/2018   Procedure: COLONOSCOPY WITH  PROPOFOL;  Surgeon: Christena Deem, MD;  Location: [facility] ENDOSCOPY;  Service: Endoscopy;  Laterality: N/A;  . EYE SURGERY      Home Medications:  Allergies as of 05/17/2020      Reactions   Cefoxitin Diarrhea      Medication List       Accurate as of May 17, 2020 10:15 AM. If you have any questions, ask your nurse or doctor.        amLODipine-olmesartan 10-40 MG tablet Commonly known as: AZOR   aspirin 81 MG tablet Take 81 mg by mouth daily.   atenolol 25 MG tablet Commonly known as: TENORMIN Take by mouth.   atenolol-chlorthalidone 100-25 MG tablet Commonly known as: TENORETIC   baclofen 10 MG tablet Commonly known as: LIORESAL   bifidobacterium infantis capsule Take by mouth.   diazepam 2 MG tablet Commonly known as: Valium 1 tablet every 8 hours as needed for muscles.   hydrOXYzine 25 MG capsule Commonly known as: VISTARIL   meloxicam 7.5 MG tablet Commonly known as: MOBIC TAKE 1 TABLET BY MOUTH EVERY DAY WITH FOOD AS NEEDED FOR PAIN   metFORMIN 500 MG tablet Commonly known as: GLUCOPHAGE Take 500 mg by mouth daily with breakfast.   nitrofurantoin (macrocrystal-monohydrate) 100 MG capsule Commonly known as: MACROBID Take 1 capsule (100 mg total) by mouth daily.   simvastatin 10 MG tablet Commonly known as: ZOCOR Take 10 mg by mouth daily at 6  PM.   tiZANidine 2 MG tablet Commonly known as: ZANAFLEX   UNKNOWN TO PATIENT Another HTN combo med   Vitamin D-1000 Max St 25 MCG (1000 UT) tablet Generic drug: Cholecalciferol Take 1,000 Units by mouth daily.       Allergies:  Allergies  Allergen Reactions  . Cefoxitin Diarrhea    Family History: Family History  Problem Relation Age of Onset  . Diabetes type II Brother   . Hypertension Brother   . Bladder Cancer Neg Hx   . Kidney cancer Neg Hx   . Breast cancer Neg Hx     Social History:  reports that she has never smoked. She has never used smokeless tobacco. She reports that  she does not drink alcohol and does not use drugs.  ROS:                                        Physical Exam: There were no vitals taken for this visit.  Constitutional:  Alert and oriented, No acute distress.   Laboratory Data: Lab Results  Component Value Date   WBC 6.3 03/02/2018   HGB 14.0 03/02/2018   HCT 39.7 03/02/2018   MCV 86.0 03/02/2018   PLT 274 03/02/2018    Lab Results  Component Value Date   CREATININE 1.17 (H) 03/02/2018    No results found for: PSA  No results found for: TESTOSTERONE  Lab Results  Component Value Date   HGBA1C 6.2 (H) 01/09/2017    Urinalysis    Component Value Date/Time   COLORURINE YELLOW (A) 01/09/2017 1725   APPEARANCEUR Clear 03/31/2019 1047   LABSPEC 1.010 06/23/2017 1907   LABSPEC 1.017 10/21/2011 1422   PHURINE 6.0 06/23/2017 1907   GLUCOSEU Negative 03/31/2019 1047   GLUCOSEU Negative 10/21/2011 1422   HGBUR TRACE (A) 06/23/2017 1907   BILIRUBINUR Negative 03/31/2019 1047   BILIRUBINUR Negative 10/21/2011 1422   KETONESUR NEGATIVE 06/23/2017 1907   PROTEINUR Negative 03/31/2019 1047   PROTEINUR NEGATIVE 06/23/2017 1907   UROBILINOGEN 0.2 06/23/2017 1907   NITRITE Negative 03/31/2019 1047   NITRITE NEGATIVE 06/23/2017 1907   LEUKOCYTESUR Negative 03/31/2019 1047   LEUKOCYTESUR 2+ 10/21/2011 1422    Pertinent Imaging:   Assessment & Plan: 30x11 sent and see in 1 year  There are no diagnoses linked to this encounter.  No follow-ups on file.  Martina Sinner, MD  [facility] [address] [place], [place] [address] [phone]

## 2020-06-01 ENCOUNTER — Ambulatory Visit: Payer: Medicare PPO | Admitting: Podiatry

## 2020-06-18 ENCOUNTER — Ambulatory Visit: Payer: Medicare PPO | Admitting: Podiatry

## 2020-06-18 ENCOUNTER — Other Ambulatory Visit: Payer: Self-pay

## 2020-06-18 ENCOUNTER — Encounter: Payer: Self-pay | Admitting: Podiatry

## 2020-06-18 DIAGNOSIS — M79674 Pain in right toe(s): Secondary | ICD-10-CM | POA: Diagnosis not present

## 2020-06-18 DIAGNOSIS — L989 Disorder of the skin and subcutaneous tissue, unspecified: Secondary | ICD-10-CM

## 2020-06-18 DIAGNOSIS — M79675 Pain in left toe(s): Secondary | ICD-10-CM

## 2020-06-18 DIAGNOSIS — B351 Tinea unguium: Secondary | ICD-10-CM | POA: Diagnosis not present

## 2020-06-18 DIAGNOSIS — E0842 Diabetes mellitus due to underlying condition with diabetic polyneuropathy: Secondary | ICD-10-CM

## 2020-06-18 NOTE — Progress Notes (Signed)
    Subjective: Patient is a 79 y.o. female with PMHx of diabetes mellitus presenting to the office today for follow up evaluation of painful callus lesions noted to the bilateral feet. Walking and bearing weight increases the pain. She has not had any treatment at home.  Patient also complains of elongated, thickened nails that cause pain while ambulating in shoes. She is unable to trim her own nails. Patient presents today for further treatment and evaluation.  Past Medical History:  Diagnosis Date  . Diabetes mellitus without complication (HCC)   . Hypercholesteremia   . Hyperlipidemia   . Hypertension     Objective:  Physical Exam General: Alert and oriented x3 in no acute distress  Dermatology: Hyperkeratotic lesions present on the bilateral feet. Pain on palpation with a central nucleated core noted. Skin is warm, dry and supple bilateral lower extremities. Negative for open lesions or macerations. Nails are tender, long, thickened and dystrophic with subungual debris, consistent with onychomycosis, 1-5 bilateral. No signs of infection noted.  Vascular: Palpable pedal pulses bilaterally. No edema or erythema noted. Capillary refill within normal limits.  Neurological: Epicritic and protective threshold diminished bilaterally.   Musculoskeletal Exam: Pain on palpation at the keratotic lesion noted. Range of motion within normal limits bilateral. Muscle strength 5/5 in all groups bilateral.  Assessment: 1. Onychodystrophic nails 1-5 bilateral with hyperkeratosis of nails.  2. 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 Pre-ulcerative callus lesions noted to the bilateral feet x 2   Plan of Care:  1. Patient evaluated. 2. Excisional debridement of keratoic lesion using a chisel blade was performed without incident.  3. Dressed with light dressing. 4. Mechanical debridement of nails 1-5 bilaterally performed using a nail nipper. Filed with dremel without incident.   5. Patient is to return to the clinic in 3 months.   Felecia Shelling, DPM [facility]  Dr. Felecia Shelling, DPM    [address]                                        [place], [place] [address]                Office [phone]  Fax [phone]

## 2020-06-22 ENCOUNTER — Ambulatory Visit: Payer: Medicare PPO | Admitting: Podiatry

## 2020-07-20 ENCOUNTER — Other Ambulatory Visit: Payer: Self-pay | Admitting: *Deleted

## 2020-07-20 MED ORDER — NITROFURANTOIN MONOHYD MACRO 100 MG PO CAPS: 100.0000 mg | ORAL_CAPSULE | Freq: Every day | ORAL | 11 refills | Status: DC

## 2020-08-18 ENCOUNTER — Other Ambulatory Visit: Payer: Self-pay | Admitting: Internal Medicine

## 2020-08-18 DIAGNOSIS — Z1231 Encounter for screening mammogram for malignant neoplasm of breast: Secondary | ICD-10-CM

## 2020-09-17 ENCOUNTER — Ambulatory Visit: Payer: BC Managed Care – PPO | Admitting: Podiatry

## 2020-09-23 ENCOUNTER — Telehealth: Payer: Self-pay

## 2020-09-23 NOTE — Telephone Encounter (Signed)
Tamara [name] from [facility] pharmacy called wanting to know the reason for daily macrobid and NDC. 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

## 2020-09-27 IMAGING — MG DIGITAL SCREENING BILAT W/ TOMO W/ CAD
6 of 10 series · 6 of 30 positions shown · non-contrast
Comparison: Previous exam(s).

CLINICAL DATA: Screening.

EXAM:
DIGITAL SCREENING BILATERAL MAMMOGRAM WITH TOMO AND CAD

[R MLO synth-2D (1 of 2)]
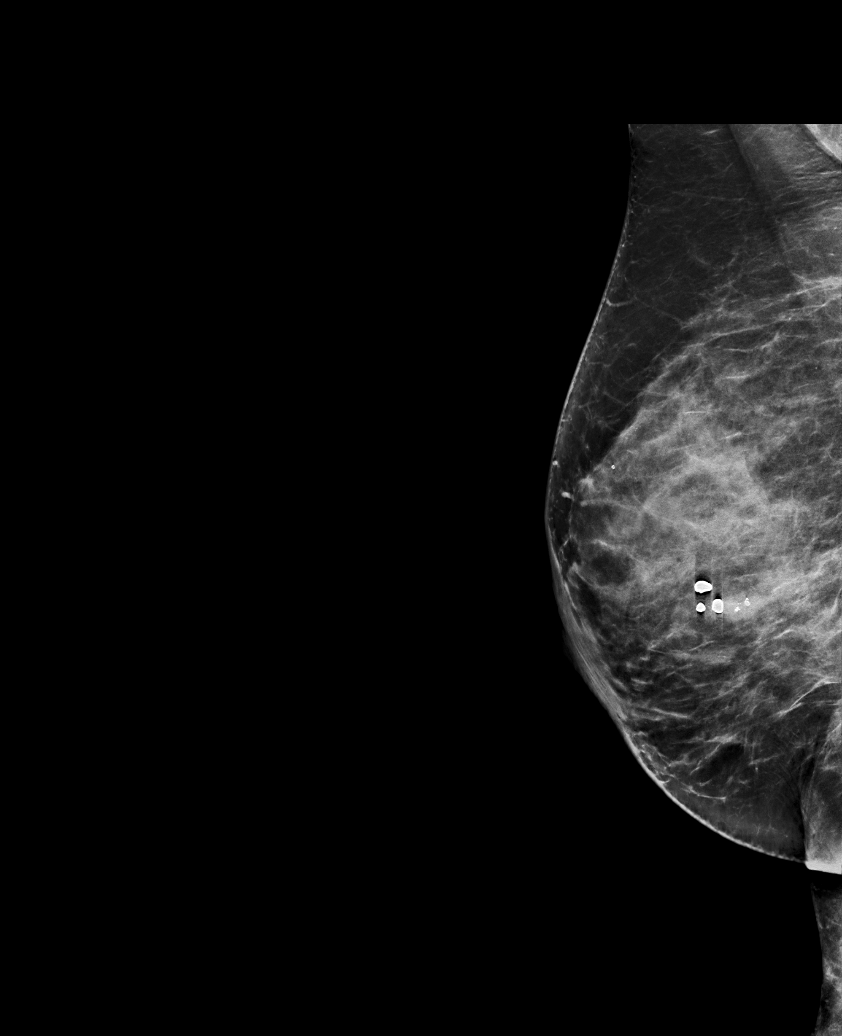

[L MLO synth-2D]
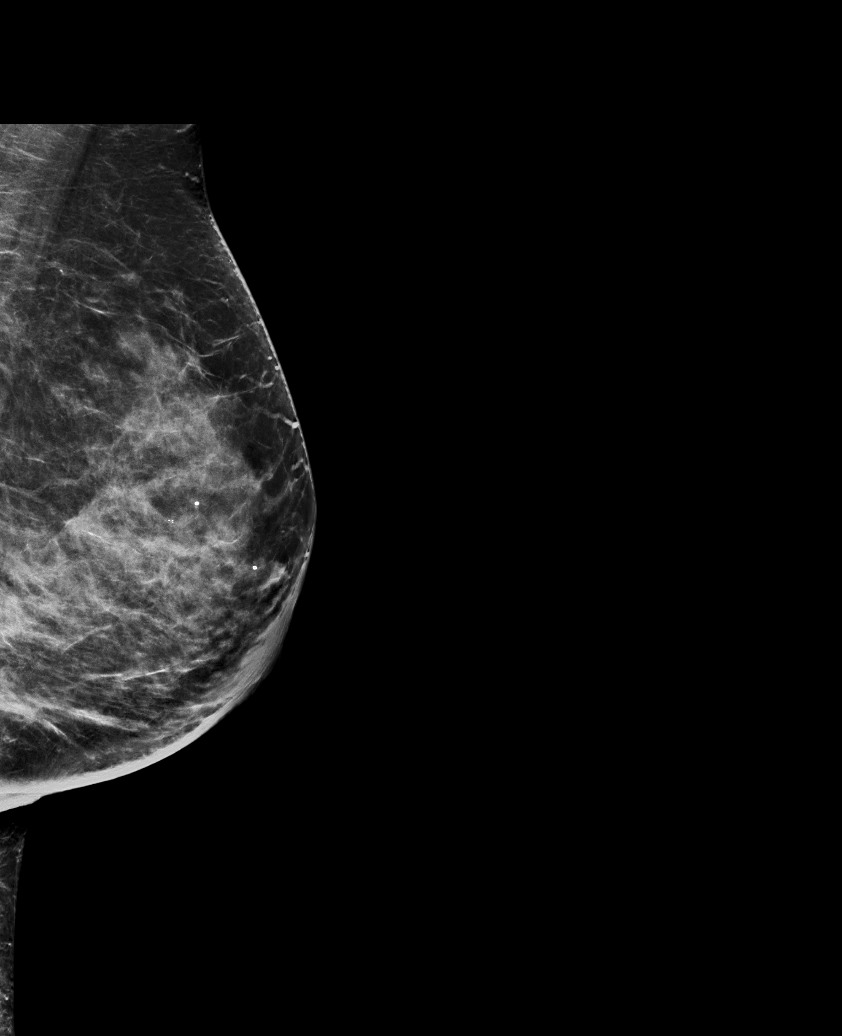

[L CC synth-2D]
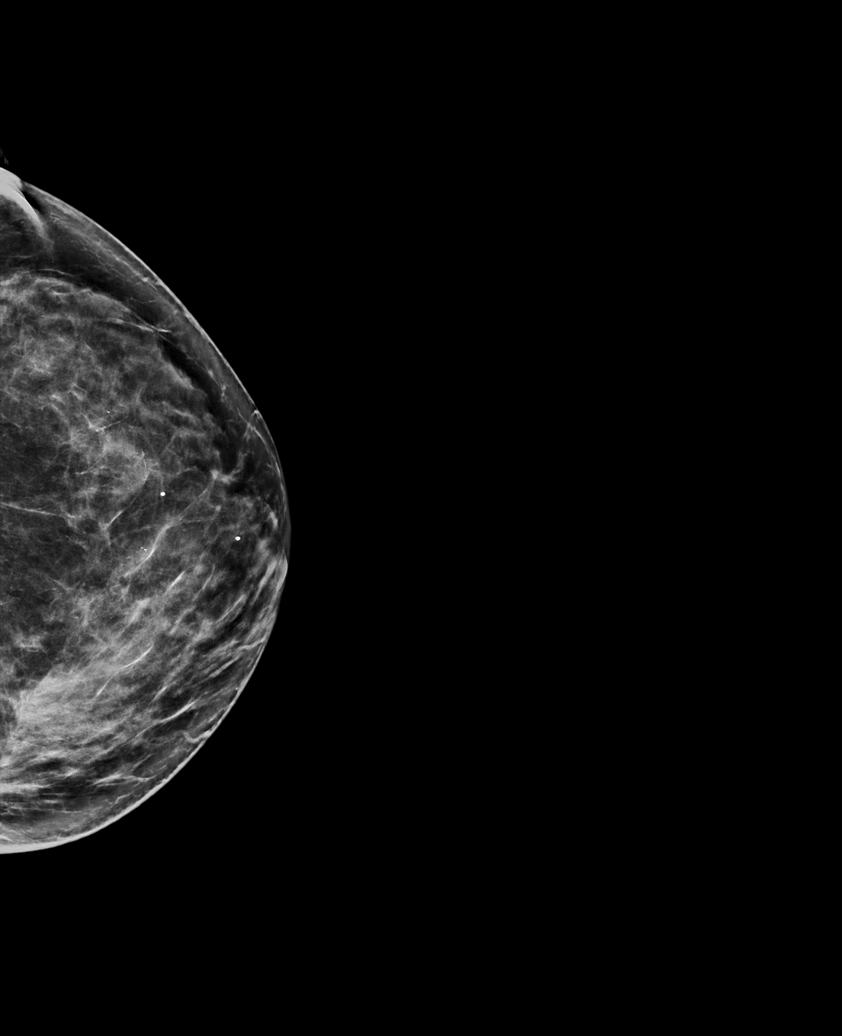

[R CC synth-2D]
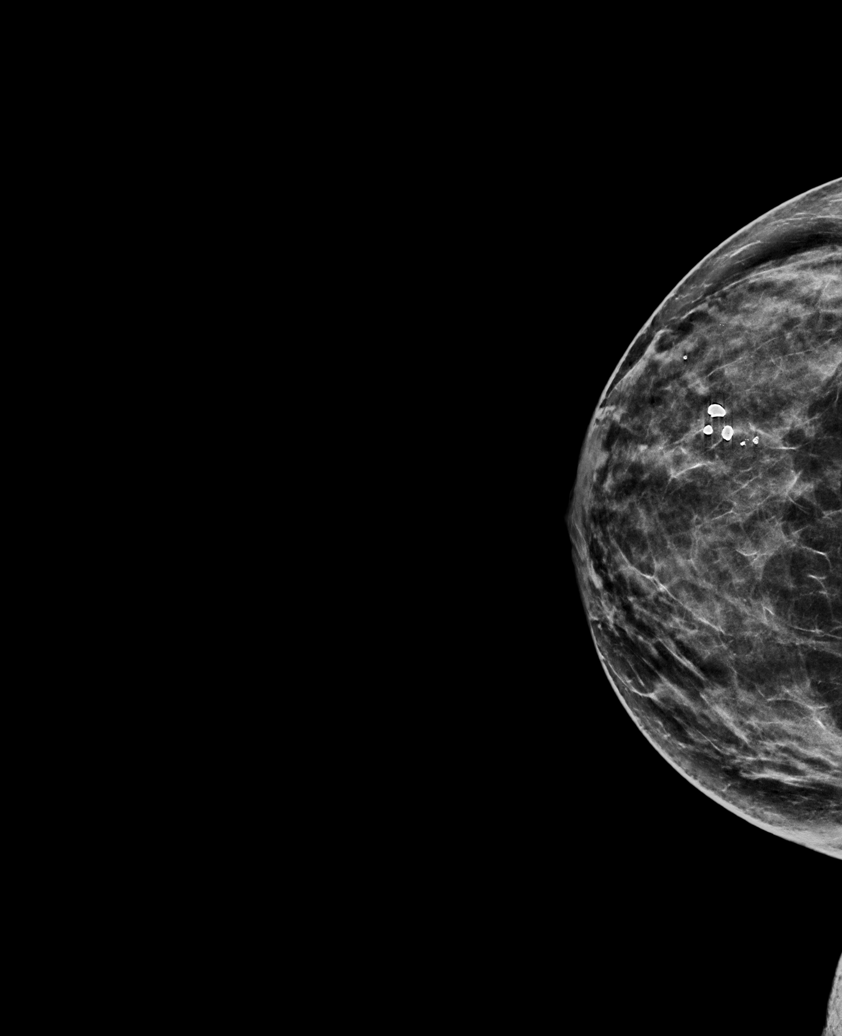

[R MLO synth-2D (2 of 2)]
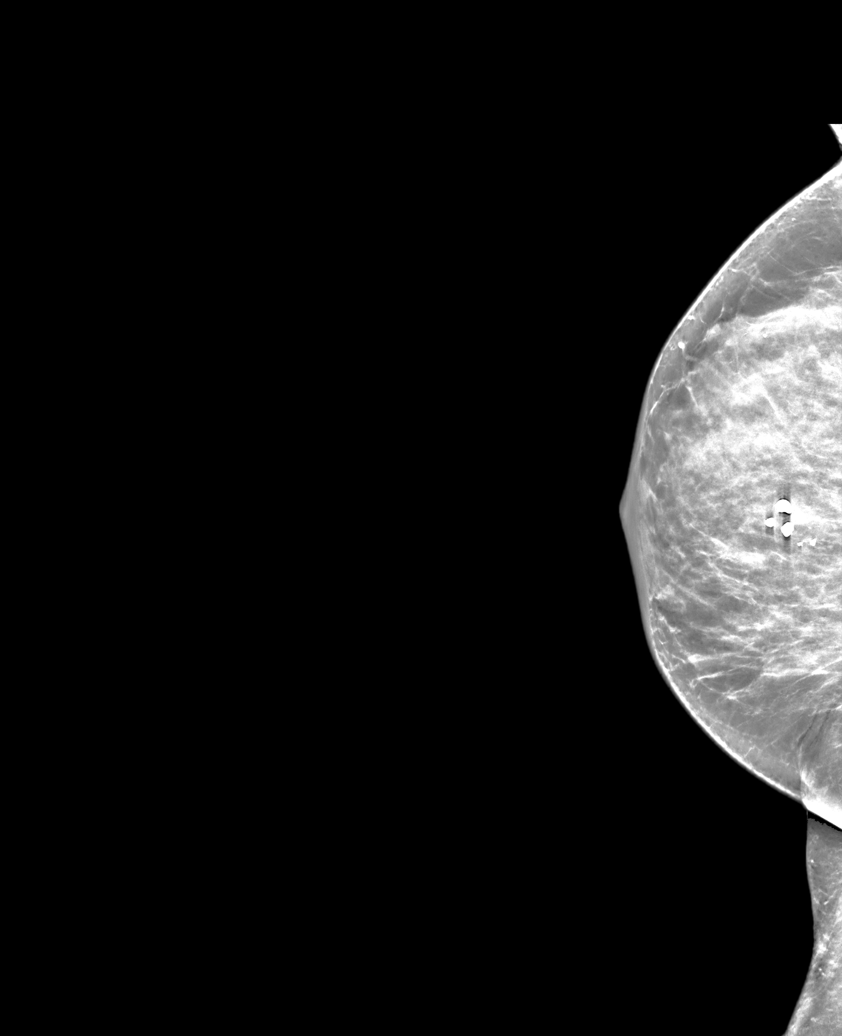

[R CC tomo · tomo slice 36/71.0]
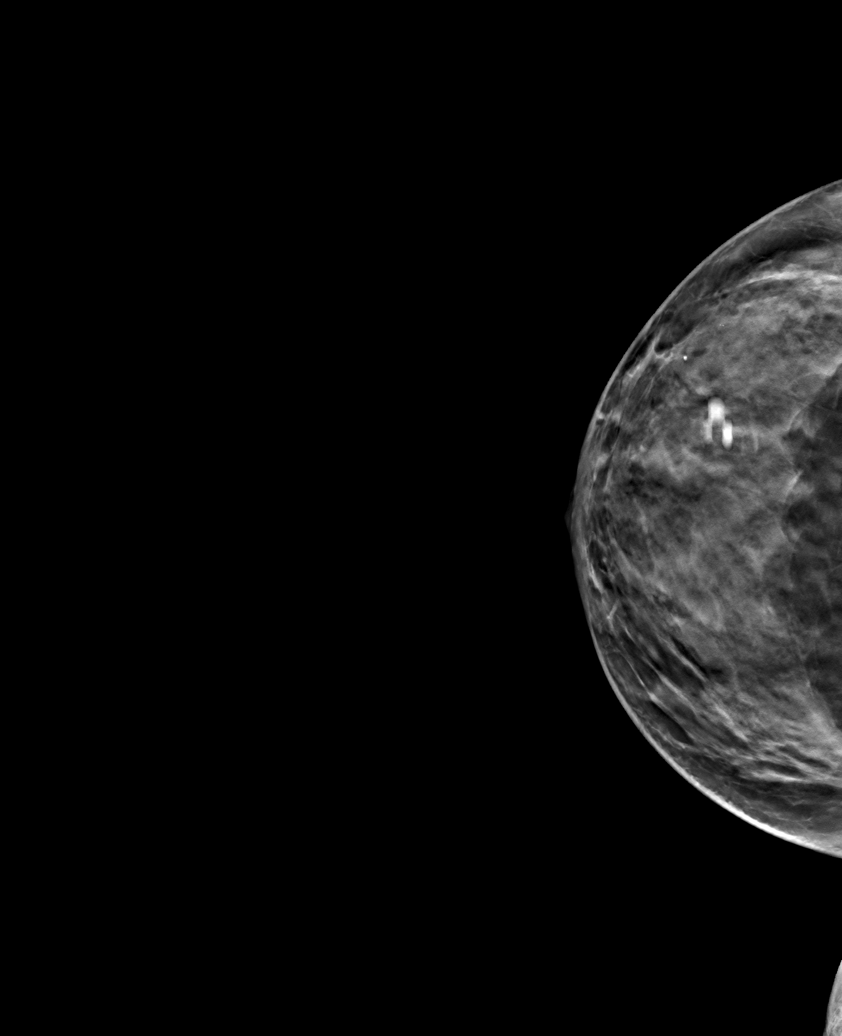

[6 of 30 positions shown; findings below may reference images not displayed]

ACR Breast Density Category d: The breast tissue is extremely dense,
which lowers the sensitivity of mammography
FINDINGS: There are no findings suspicious for malignancy. Images were
processed with CAD.
IMPRESSION: No mammographic evidence of malignancy. A result letter of this
screening mammogram will be mailed directly to the patient.

RECOMMENDATION:
Screening mammogram in one year. (Code:WO-0-ZI0)

BI-RADS CATEGORY  1: Negative.

## 2020-10-15 ENCOUNTER — Ambulatory Visit
Admission: RE | Admit: 2020-10-15 | Discharge: 2020-10-15 | Disposition: A | Discharge status: Home / Self Care | Payer: Medicare Other | Source: Ambulatory Visit | Attending: Internal Medicine | Admitting: Internal Medicine

## 2020-10-15 ENCOUNTER — Other Ambulatory Visit: Payer: Self-pay

## 2020-10-15 DIAGNOSIS — Z1231 Encounter for screening mammogram for malignant neoplasm of breast: Secondary | ICD-10-CM | POA: Diagnosis not present

## 2020-11-30 ENCOUNTER — Other Ambulatory Visit: Payer: Self-pay

## 2020-11-30 ENCOUNTER — Ambulatory Visit (INDEPENDENT_AMBULATORY_CARE_PROVIDER_SITE_OTHER): Payer: Medicare Other | Admitting: Podiatry

## 2020-11-30 DIAGNOSIS — M79675 Pain in left toe(s): Secondary | ICD-10-CM

## 2020-11-30 DIAGNOSIS — M79674 Pain in right toe(s): Secondary | ICD-10-CM

## 2020-11-30 DIAGNOSIS — L989 Disorder of the skin and subcutaneous tissue, unspecified: Secondary | ICD-10-CM

## 2020-11-30 DIAGNOSIS — B351 Tinea unguium: Secondary | ICD-10-CM

## 2020-11-30 DIAGNOSIS — E0842 Diabetes mellitus due to underlying condition with diabetic polyneuropathy: Secondary | ICD-10-CM

## 2020-11-30 NOTE — Progress Notes (Signed)
    Subjective: Patient is a 80 y.o. female with PMHx of diabetes mellitus presenting to the office today for follow up evaluation of painful callus lesions noted to the bilateral feet. Walking and bearing weight increases the pain. She has not had any treatment at home.  Patient also complains of elongated, thickened nails that cause pain while ambulating in shoes. She is unable to trim her own nails. Patient presents today for further treatment and evaluation.  Past Medical History:  Diagnosis Date  . Diabetes mellitus without complication (HCC)   . Hypercholesteremia   . Hyperlipidemia   . Hypertension     Objective:  Physical Exam General: Alert and oriented x3 in no acute distress  Dermatology: Hyperkeratotic lesions present on the bilateral feet. Pain on palpation with a central nucleated core noted. Skin is warm, dry and supple bilateral lower extremities. Negative for open lesions or macerations. Nails are tender, long, thickened and dystrophic with subungual debris, consistent with onychomycosis, 1-5 bilateral. No signs of infection noted.  Vascular: Palpable pedal pulses bilaterally. No edema or erythema noted. Capillary refill within normal limits.  Neurological: Epicritic and protective threshold diminished bilaterally.   Musculoskeletal Exam: Pain on palpation at the keratotic lesion noted. Range of motion within normal limits bilateral. Muscle strength 5/5 in all groups bilateral.  Assessment: 1. Onychodystrophic nails 1-5 bilateral with hyperkeratosis of nails.  2. 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 Pre-ulcerative callus lesions noted to the bilateral feet x 2   Plan of Care:  1. Patient evaluated. 2. Excisional debridement of keratoic lesion using a chisel blade was performed without incident.  3. Dressed with light dressing. 4. Mechanical debridement of nails 1-5 bilaterally performed using a nail nipper. Filed with dremel without incident.   5. Patient is to return to the clinic in 3 months.   Tamara [name], DPM [facility]  Dr. Felecia [name], DPM    [address] [place], [place] [address]                Office [phone]  Fax [phone]

## 2020-12-02 ENCOUNTER — Telehealth: Payer: Self-pay | Admitting: Urology

## 2020-12-02 MED ORDER — NITROFURANTOIN MONOHYD MACRO 100 MG PO CAPS: 100.0000 mg | ORAL_CAPSULE | Freq: Every day | ORAL | 1 refills | Status: DC

## 2020-12-02 NOTE — Telephone Encounter (Signed)
Sent!

## 2020-12-02 NOTE — Telephone Encounter (Signed)
Pt would like to have her Macrobid switched to a 90 day supply through [facility].

## 2021-01-04 ENCOUNTER — Ambulatory Visit: Payer: Medicare Other | Admitting: Obstetrics and Gynecology

## 2021-01-31 NOTE — Progress Notes (Signed)
Chief Complaint  Patient presents with   Gynecologic Exam    No concerns     HPI:      [name] is a 80 y.o. G2P0 who LMP was No LMP recorded. Patient is postmenopausal., presents today for her MEDICARE annual examination. Her menses are absent due to menopause. She does not have PMB. She does not have vasomotor sx.  Sex activity: not sexually active. She does not have vaginal dryness.  Last Pap: August 13, 2014  Results were: no abnormalities   Hx of STDs: none  Last mammogram: 10/15/20 Results were: normal--routine follow-up in 12 months There is no FH of breast cancer. There is no FH of ovarian cancer. The patient does self-breast exams.  Colonoscopy: colonoscopy 2020 with Dr. Marva Panda;  without abnormalities. Doesn't need one again unless has problems. Had issues with a hemorrhoid flare a few months ago; saw PCP. Sx resolved.   Tobacco use: The patient denies current or previous tobacco use. Alcohol use: none No drug use Exercise: min active  She does get adequate calcium and Vitamin D in her diet. She is followed by her PCP.  Past Medical History:  Diagnosis Date   Diabetes mellitus without complication (HCC)    Hypercholesteremia    Hyperlipidemia    Hypertension     Past Surgical History:  Procedure Laterality Date   APPENDECTOMY     COLONOSCOPY WITH PROPOFOL N/A 08/09/2018   Procedure: COLONOSCOPY WITH PROPOFOL;  Surgeon: Christena Deem, MD;  Location[facility] ENDOSCOPY;  Service: Endoscopy;  Laterality: N/A;   EYE SURGERY      Family History  Problem Relation Age of Onset   Diabetes type II Brother    Hypertension Brother    Bladder Cancer Neg Hx    Kidney cancer Neg Hx    Breast cancer Neg Hx      ROS:  Review of Systems  Constitutional:  Negative for fever, malaise/fatigue and weight loss.  HENT:  Negative for congestion, ear pain and sinus pain.   Respiratory:  Negative for cough, shortness of breath and wheezing.    Cardiovascular:  Negative for chest pain, orthopnea and leg swelling.  Gastrointestinal:  Negative for constipation, diarrhea, nausea and vomiting.  Genitourinary:  Negative for dysuria, frequency, hematuria and urgency.       Breast ROS: negative   Musculoskeletal:  Negative for back pain, joint pain and myalgias.  Skin:  Negative for itching and rash.  Neurological:  Negative for dizziness, tingling, focal weakness and headaches.  Endo/Heme/Allergies:  Negative for environmental allergies. Does not bruise/bleed easily.  Psychiatric/Behavioral:  Negative for depression and suicidal ideas. The patient is not nervous/anxious and does not have insomnia.    Objective: BP 130/80   Ht 5\' 5"  (1.651 m)   Wt 160 lb (72.6 kg)   BMI 26.63 kg/m    Physical Exam Constitutional:      Appearance: She is well-developed.  Genitourinary:     Vulva normal.     Right Labia: No rash, tenderness or lesions.    Left Labia: No tenderness, lesions or rash.    No vaginal discharge, erythema or tenderness.      Right Adnexa: not tender and no mass present.    Left Adnexa: not tender and no mass present.    No cervical motion tenderness, friability or polyp.     Uterus is not enlarged or tender.  Breasts:    Right: No mass, nipple discharge, skin change or  tenderness.     Left: No mass, nipple discharge, skin change or tenderness.  Neck:     Thyroid: No thyromegaly.  Cardiovascular:     Rate and Rhythm: Normal rate and regular rhythm.     Heart sounds: Normal heart sounds. No murmur heard. Pulmonary:     Effort: Pulmonary effort is normal.     Breath sounds: Normal breath sounds.  Abdominal:     Palpations: Abdomen is soft.     Tenderness: There is no abdominal tenderness. There is no guarding or rebound.  Musculoskeletal:        General: Normal range of motion.     Cervical back: Normal range of motion.  Lymphadenopathy:     Cervical: No cervical adenopathy.  Neurological:     General: No  focal deficit present.     Mental Status: She is alert and oriented to person, place, and time.     Cranial Nerves: No cranial nerve deficit.  Skin:    General: Skin is warm and dry.  Psychiatric:        Mood and Affect: Mood normal.        Behavior: Behavior normal.        Thought Content: Thought content normal.        Judgment: Judgment normal.  Vitals reviewed.    Assessment/Plan: Encounter for annual routine gynecological examination  Cervical cancer screening - Plan: Cytology - PAP          GYN counsel mammography screening, menopause, adequate intake of calcium and vitamin D     F/U  Return in about 2 years (around 02/02/2023). If desires but no longer indicated. F/u prn GYN problems  Makeba Delcastillo B. Beyounce Dickens, PA-C 02/01/2021 10:50 AM

## 2021-02-01 ENCOUNTER — Other Ambulatory Visit: Payer: Self-pay

## 2021-02-01 ENCOUNTER — Ambulatory Visit (INDEPENDENT_AMBULATORY_CARE_PROVIDER_SITE_OTHER): Payer: Medicare Other | Admitting: Obstetrics and Gynecology

## 2021-02-01 ENCOUNTER — Other Ambulatory Visit (HOSPITAL_COMMUNITY)
Admission: RE | Admit: 2021-02-01 | Discharge: 2021-02-01 | Disposition: A | Discharge status: Home / Self Care | Payer: Medicare Other | Source: Ambulatory Visit | Attending: Obstetrics and Gynecology | Admitting: Obstetrics and Gynecology

## 2021-02-01 ENCOUNTER — Encounter: Payer: Self-pay | Admitting: Obstetrics and Gynecology

## 2021-02-01 VITALS — BP 130/80 | Ht 65.0 in | Wt 160.0 lb

## 2021-02-01 DIAGNOSIS — Z01419 Encounter for gynecological examination (general) (routine) without abnormal findings: Secondary | ICD-10-CM

## 2021-02-01 DIAGNOSIS — Z124 Encounter for screening for malignant neoplasm of cervix: Secondary | ICD-10-CM

## 2021-02-01 NOTE — Patient Instructions (Signed)
I value your feedback and you entrusting us with your care. If you get a [facility] patient survey, I would appreciate you taking the time to let us know about your experience today. Thank you! ? ? ?

## 2021-02-02 LAB — CYTOLOGY - PAP: Diagnosis: NEGATIVE

## 2021-02-10 ENCOUNTER — Telehealth: Payer: Self-pay

## 2021-02-10 NOTE — Telephone Encounter (Signed)
Pt left msg on triage asking for pap results from her annual visit with [facility]. Like [facility] says "no news, good news". Pt aware normal pap.

## 2021-03-08 ENCOUNTER — Ambulatory Visit: Payer: Medicare Other | Admitting: Podiatry

## 2021-04-05 ENCOUNTER — Other Ambulatory Visit: Payer: Self-pay

## 2021-04-05 ENCOUNTER — Ambulatory Visit (INDEPENDENT_AMBULATORY_CARE_PROVIDER_SITE_OTHER): Payer: Medicare Other | Admitting: Podiatry

## 2021-04-05 ENCOUNTER — Encounter (INDEPENDENT_AMBULATORY_CARE_PROVIDER_SITE_OTHER): Payer: Self-pay

## 2021-04-05 DIAGNOSIS — E0842 Diabetes mellitus due to underlying condition with diabetic polyneuropathy: Secondary | ICD-10-CM | POA: Diagnosis not present

## 2021-04-05 DIAGNOSIS — L989 Disorder of the skin and subcutaneous tissue, unspecified: Secondary | ICD-10-CM

## 2021-04-05 DIAGNOSIS — M79674 Pain in right toe(s): Secondary | ICD-10-CM

## 2021-04-05 DIAGNOSIS — B351 Tinea unguium: Secondary | ICD-10-CM | POA: Diagnosis not present

## 2021-04-05 DIAGNOSIS — M79675 Pain in left toe(s): Secondary | ICD-10-CM | POA: Diagnosis not present

## 2021-04-05 NOTE — Progress Notes (Signed)
    Subjective: Patient is a 80 y.o. female with PMHx of diabetes mellitus presenting to the office today for follow up evaluation of painful callus lesions noted to the bilateral feet. Walking and bearing weight increases the pain. She has not had any treatment at home.  Patient also complains of elongated, thickened nails that cause pain while ambulating in shoes. She is unable to trim her own nails. Patient presents today for further treatment and evaluation.  Past Medical History:  Diagnosis Date   Diabetes mellitus without complication (HCC)    Hypercholesteremia    Hyperlipidemia    Hypertension     Objective:  Physical Exam General: Alert and oriented x3 in no acute distress  Dermatology: Hyperkeratotic lesions present on the bilateral feet. Pain on palpation with a central nucleated core noted. Skin is warm, dry and supple bilateral lower extremities. Negative for open lesions or macerations. Nails are tender, long, thickened and dystrophic with subungual debris, consistent with onychomycosis, 1-5 bilateral. No signs of infection noted.  Vascular: Palpable pedal pulses bilaterally. No edema or erythema noted. Capillary refill within normal limits.  Neurological: Epicritic and protective threshold diminished bilaterally.   Musculoskeletal Exam: Pain on palpation at the keratotic lesion noted. Range of motion within normal limits bilateral. Muscle strength 5/5 in all groups bilateral.  Assessment: 1. Onychodystrophic nails 1-5 bilateral with hyperkeratosis of nails.  2. 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 Pre-ulcerative callus lesions noted to the bilateral feet x 2   Plan of Care:  1. Patient evaluated. 2. Excisional debridement of keratoic lesion using a chisel blade was performed without incident.  3. Dressed with light dressing. 4. Mechanical debridement of nails 1-5 bilaterally performed using a nail nipper. Filed with dremel without incident.  5.  Patient is to return to the clinic in 3 months.   Tamara [name], DPM [facility]  Dr. Felecia [name], DPM    [address] [place], [place] [address]                Office [phone]  Fax [phone]

## 2021-05-23 ENCOUNTER — Ambulatory Visit: Payer: Self-pay | Admitting: Urology

## 2021-06-01 ENCOUNTER — Telehealth: Payer: Self-pay | Admitting: Urology

## 2021-06-01 MED ORDER — NITROFURANTOIN MONOHYD MACRO 100 MG PO CAPS: 100.0000 mg | ORAL_CAPSULE | Freq: Every day | ORAL | 0 refills | Status: DC

## 2021-06-01 NOTE — Telephone Encounter (Signed)
Medication refilled. Pt must keep follow up appointment

## 2021-06-01 NOTE — Telephone Encounter (Signed)
Pt rescheduled her appt to 07/18/2021, but will be out of her medication (Macrobid) and would like to know if she could get a refill. Pt would like a call back in regards to this matter.

## 2021-06-13 ENCOUNTER — Ambulatory Visit: Payer: Self-pay | Admitting: Urology

## 2021-07-18 ENCOUNTER — Encounter: Payer: Self-pay | Admitting: Urology

## 2021-07-18 ENCOUNTER — Other Ambulatory Visit: Payer: Self-pay

## 2021-07-18 ENCOUNTER — Ambulatory Visit (INDEPENDENT_AMBULATORY_CARE_PROVIDER_SITE_OTHER): Payer: Medicare Other | Admitting: Urology

## 2021-07-18 VITALS — BP 134/81 | HR 58 | Ht 65.0 in | Wt 160.0 lb

## 2021-07-18 DIAGNOSIS — N3020 Other chronic cystitis without hematuria: Secondary | ICD-10-CM

## 2021-07-18 MED ORDER — NITROFURANTOIN MONOHYD MACRO 100 MG PO CAPS: 100.0000 mg | ORAL_CAPSULE | Freq: Every day | ORAL | 3 refills | Status: DC

## 2021-07-18 NOTE — Progress Notes (Signed)
07/18/2021 2:10 PM   Tamara [name] 12-May-1941 [id number]  Referring provider: Jodi Marble, MD [place],  [place] [address]  Chief Complaint  Patient presents with   Cystitis    HPI: The patient was discharged in July 2018 and had had acute encephalopathy with urinary tract infection and sepsis. Culture was positive. Her renal ultrasound in July was normal   I follow the patient in [place] and I have had her on daily trimethoprim for 2 or 3 years. She still gets approximate 2 infections a year and her symptoms were a little bit nonspecific. Her daughter from [place] was here and there is no question that about every 2 year she can end up in the hospital with CNS symptoms and she often gets foul smelling urine. She believes her urine is a bit foul-smelling in the morning now.   Patient had been on daily Macrodantin and then saw a nurse practitioner February 17, 2019.  She had been placed on Augmentin by primary care for a lesion in the pelvic inguinal crease on the left side.  She was on the Macrodantin.  Apparently was also having pain around the rectum.  Patient did have a positive urine culture and microscopic hematuria that day   I found the patient's history little bit challenging and she was not clear if she was on prophylaxis but she does take a probiotic.   The patient was cleared with a normal urinalysis.  She did have a positive culture on the day.  She is on daily Macrodantin.  Prescription renewed and I will see in 1 year   TOday Frequency stable.  Clinically not infected.  Infection free on daily Macrodantin.  No breakthroughs.  Still takes probiotic.  No infections.   PMH: Past Medical History:  Diagnosis Date   Diabetes mellitus without complication ([facility])    Hypercholesteremia    Hyperlipidemia    Hypertension     Surgical History: Past Surgical History:  Procedure Laterality Date   APPENDECTOMY     COLONOSCOPY WITH PROPOFOL N/A  08/09/2018   Procedure: COLONOSCOPY WITH PROPOFOL;  Surgeon: Lollie Sails, MD;  Location: [facility] ENDOSCOPY;  Service: Endoscopy;  Laterality: N/A;   EYE SURGERY      Home Medications:  Allergies as of 07/18/2021       Reactions   Cefoxitin Diarrhea        Medication List        Accurate as of July 18, 2021  2:10 PM. If you have any questions, ask your nurse or doctor.          amLODipine-olmesartan 10-40 MG tablet Commonly known as: AZOR   aspirin 81 MG tablet Take 81 mg by mouth daily.   atenolol 25 MG tablet Commonly known as: TENORMIN Take by mouth.   atenolol-chlorthalidone 100-25 MG tablet Commonly known as: TENORETIC   baclofen 10 MG tablet Commonly known as: LIORESAL   bifidobacterium infantis capsule Take by mouth.   Cholecalciferol 25 MCG (1000 UT) tablet Take 1,000 Units by mouth daily.   diazepam 2 MG tablet Commonly known as: Valium 1 tablet every 8 hours as needed for muscles.   hydrOXYzine 25 MG capsule Commonly known as: VISTARIL   meloxicam 7.5 MG tablet Commonly known as: MOBIC TAKE 1 TABLET BY MOUTH EVERY DAY WITH FOOD AS NEEDED FOR PAIN   metFORMIN 500 MG tablet Commonly known as: GLUCOPHAGE Take 500 mg by mouth daily with breakfast.   nitrofurantoin (macrocrystal-monohydrate) 100  MG capsule Commonly known as: MACROBID Take 1 capsule (100 mg total) by mouth daily.   simvastatin 10 MG tablet Commonly known as: ZOCOR Take 10 mg by mouth daily at 6 PM.   tiZANidine 2 MG tablet Commonly known as: ZANAFLEX   UNKNOWN TO PATIENT Another HTN combo med        Allergies:  Allergies  Allergen Reactions   Cefoxitin Diarrhea    Family History: Family History  Problem Relation Age of Onset   Diabetes type II Brother    Hypertension Brother    Bladder Cancer Neg Hx    Kidney cancer Neg Hx    Breast cancer Neg Hx     Social History:  reports that she has never smoked. She has never used smokeless tobacco. She  reports that she does not drink alcohol and does not use drugs.  ROS:                                        Physical Exam: BP 134/81    Pulse (!) 58    Ht 5\' 5"  (1.651 m)    Wt 72.6 kg    BMI 26.63 kg/m   Constitutional:  Alert and oriented, No acute distress. HEENT: [place] AT, moist mucus membranes.  Trachea midline, no masses.  Laboratory Data: Lab Results  Component Value Date   WBC 6.3 03/02/2018   HGB 14.0 03/02/2018   HCT 39.7 03/02/2018   MCV 86.0 03/02/2018   PLT 274 03/02/2018    Lab Results  Component Value Date   CREATININE 1.17 (H) 03/02/2018    No results found for: PSA  No results found for: TESTOSTERONE  Lab Results  Component Value Date   HGBA1C 6.2 (H) 01/09/2017    Urinalysis    Component Value Date/Time   COLORURINE YELLOW (A) 01/09/2017 1725   APPEARANCEUR Clear 03/31/2019 1047   LABSPEC 1.010 06/23/2017 1907   LABSPEC 1.017 10/21/2011 1422   PHURINE 6.0 06/23/2017 1907   GLUCOSEU Negative 03/31/2019 1047   GLUCOSEU Negative 10/21/2011 1422   HGBUR TRACE (A) 06/23/2017 1907   BILIRUBINUR Negative 03/31/2019 1047   BILIRUBINUR Negative 10/21/2011 1422   KETONESUR NEGATIVE 06/23/2017 1907   PROTEINUR Negative 03/31/2019 1047   PROTEINUR NEGATIVE 06/23/2017 1907   UROBILINOGEN 0.2 06/23/2017 1907   NITRITE Negative 03/31/2019 1047   NITRITE NEGATIVE 06/23/2017 1907   LEUKOCYTESUR Negative 03/31/2019 1047   LEUKOCYTESUR 2+ 10/21/2011 1422    Pertinent Imaging:   Assessment & Plan: Macrodantin 30x11 sent to pharmacy and I will see her in 1 year  There are no diagnoses linked to this encounter.  No follow-ups on file.  Reece Packer, MD  [facility] [address] [place], [place] [address] [phone]

## 2021-09-22 ENCOUNTER — Other Ambulatory Visit: Payer: Self-pay | Admitting: Internal Medicine

## 2021-09-22 DIAGNOSIS — Z1231 Encounter for screening mammogram for malignant neoplasm of breast: Secondary | ICD-10-CM

## 2021-09-26 ENCOUNTER — Other Ambulatory Visit: Payer: Self-pay

## 2021-09-26 MED ORDER — NITROFURANTOIN MONOHYD MACRO 100 MG PO CAPS: 100.0000 mg | ORAL_CAPSULE | Freq: Every day | ORAL | 3 refills | Status: DC

## 2021-09-26 NOTE — Telephone Encounter (Signed)
Medication sent to pharmacy  

## 2021-09-30 ENCOUNTER — Ambulatory Visit (INDEPENDENT_AMBULATORY_CARE_PROVIDER_SITE_OTHER): Payer: Medicare Other | Admitting: Podiatry

## 2021-09-30 DIAGNOSIS — M79674 Pain in right toe(s): Secondary | ICD-10-CM | POA: Diagnosis not present

## 2021-09-30 DIAGNOSIS — M79675 Pain in left toe(s): Secondary | ICD-10-CM | POA: Diagnosis not present

## 2021-09-30 DIAGNOSIS — E0842 Diabetes mellitus due to underlying condition with diabetic polyneuropathy: Secondary | ICD-10-CM | POA: Diagnosis not present

## 2021-09-30 DIAGNOSIS — B351 Tinea unguium: Secondary | ICD-10-CM

## 2021-09-30 DIAGNOSIS — L989 Disorder of the skin and subcutaneous tissue, unspecified: Secondary | ICD-10-CM | POA: Diagnosis not present

## 2021-09-30 NOTE — Progress Notes (Signed)
? ? ?  Subjective: Patient is a 81 y.o. female with PMHx of diabetes mellitus presenting to the office today for follow up evaluation of painful callus lesions noted to the bilateral feet. Walking and bearing weight increases the pain. She has not had any treatment at home.  Patient also complains of elongated, thickened nails that cause pain while ambulating in shoes. She is unable to trim her own nails. Patient presents today for further treatment and evaluation.  Past Medical History:  Diagnosis Date   Diabetes mellitus without complication (HCC)    Hypercholesteremia    Hyperlipidemia    Hypertension     Objective:  Physical Exam General: Alert and oriented x3 in no acute distress  Dermatology: Hyperkeratotic lesions present on the bilateral feet. Pain on palpation with a central nucleated core noted. Skin is warm, dry and supple bilateral lower extremities. Negative for open lesions or macerations. Nails are tender, long, thickened and dystrophic with subungual debris, consistent with onychomycosis, 1-5 bilateral. No signs of infection noted.  Vascular: Palpable pedal pulses bilaterally. No edema or erythema noted. Capillary refill within normal limits.  Neurological: Epicritic and protective threshold diminished bilaterally.   Musculoskeletal Exam: Pain on palpation at the keratotic lesion noted. Range of motion within normal limits bilateral. Muscle strength 5/5 in all groups bilateral.  Assessment: 1. Onychodystrophic nails 1-5 bilateral with hyperkeratosis of nails.  2. 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 Pre-ulcerative callus lesions noted to the bilateral feet x 2   Plan of Care:  1. Patient evaluated. 2. Excisional debridement of keratoic lesion using a chisel blade was performed without incident.  3. Dressed with light dressing. 4. Mechanical debridement of nails 1-5 bilaterally performed using a nail nipper. Filed with dremel without incident.  5.  Patient is to return to the clinic in 3 months.   Bensen Chadderdon M. Walton Digilio, DPM [facility]  Dr. Adream Parzych M. Koston Hennes, DPM    [address]                                      [place], [place] [address]                Office [phone]  Fax [phone]   

## 2021-10-31 ENCOUNTER — Ambulatory Visit
Admission: RE | Admit: 2021-10-31 | Discharge: 2021-10-31 | Disposition: A | Discharge status: Home / Self Care | Payer: Medicare Other | Source: Ambulatory Visit | Attending: Internal Medicine | Admitting: Internal Medicine

## 2021-10-31 DIAGNOSIS — Z1231 Encounter for screening mammogram for malignant neoplasm of breast: Secondary | ICD-10-CM | POA: Insufficient documentation

## 2022-02-17 ENCOUNTER — Ambulatory Visit (INDEPENDENT_AMBULATORY_CARE_PROVIDER_SITE_OTHER): Payer: Medicare Other | Admitting: Podiatry

## 2022-02-17 DIAGNOSIS — M79674 Pain in right toe(s): Secondary | ICD-10-CM | POA: Diagnosis not present

## 2022-02-17 DIAGNOSIS — B351 Tinea unguium: Secondary | ICD-10-CM

## 2022-02-17 DIAGNOSIS — M79675 Pain in left toe(s): Secondary | ICD-10-CM | POA: Diagnosis not present

## 2022-02-17 NOTE — Progress Notes (Signed)
   SUBJECTIVE Patient PMHx diabetes mellitus presents to office today complaining of elongated, thickened nails that cause pain while ambulating in shoes.  Patient is unable to trim their own nails. Patient is here for further evaluation and treatment.  Past Medical History:  Diagnosis Date   Diabetes mellitus without complication (HCC)    Hypercholesteremia    Hyperlipidemia    Hypertension     OBJECTIVE General Patient is awake, alert, and oriented x 3 and in no acute distress. Derm Skin is dry and supple bilateral. Negative open lesions or macerations. Remaining integument unremarkable. Nails are tender, long, thickened and dystrophic with subungual debris, consistent with onychomycosis, 1-5 bilateral. No signs of infection noted. Vasc  DP and PT pedal pulses palpable bilaterally. Temperature gradient within normal limits.  Neuro Epicritic and protective threshold sensation grossly intact bilaterally.  Musculoskeletal Exam No symptomatic pedal deformities noted bilateral. Muscular strength within normal limits.  ASSESSMENT 1.  Pain due to onychomycosis of toenails both  PLAN OF CARE 1. Patient evaluated today.  2. Instructed to maintain good pedal hygiene and foot care.  3. Mechanical debridement of nails 1-5 bilaterally performed using a nail nipper. Filed with dremel without incident.  4. Return to clinic in 3 mos.    Felecia Shelling, DPM [facility]  Dr. Felecia Shelling, DPM    [address] [place], [place] [address]                Office [phone]  Fax [phone]

## 2022-02-21 ENCOUNTER — Ambulatory Visit: Payer: Medicare Other | Admitting: Podiatry

## 2022-05-19 ENCOUNTER — Ambulatory Visit: Payer: Medicare Other | Admitting: Podiatry

## 2022-06-03 ENCOUNTER — Other Ambulatory Visit: Payer: Self-pay

## 2022-06-03 ENCOUNTER — Emergency Department: Payer: Medicare Other

## 2022-06-03 ENCOUNTER — Inpatient Hospital Stay
Admission: EM | Admit: 2022-06-03 | Discharge: 2022-06-08 | DRG: 563 | Disposition: A | Discharge status: Skilled Nursing Facility | Payer: Medicare Other | Attending: Hospitalist | Admitting: Hospitalist

## 2022-06-03 DIAGNOSIS — F05 Delirium due to known physiological condition: Secondary | ICD-10-CM | POA: Diagnosis not present

## 2022-06-03 DIAGNOSIS — Z751 Person awaiting admission to adequate facility elsewhere: Secondary | ICD-10-CM | POA: Diagnosis not present

## 2022-06-03 DIAGNOSIS — Z7984 Long term (current) use of oral hypoglycemic drugs: Secondary | ICD-10-CM | POA: Diagnosis not present

## 2022-06-03 DIAGNOSIS — M1129 Other chondrocalcinosis, multiple sites: Secondary | ICD-10-CM | POA: Diagnosis present

## 2022-06-03 DIAGNOSIS — Z8249 Family history of ischemic heart disease and other diseases of the circulatory system: Secondary | ICD-10-CM

## 2022-06-03 DIAGNOSIS — S82832A Other fracture of upper and lower end of left fibula, initial encounter for closed fracture: Secondary | ICD-10-CM | POA: Diagnosis present

## 2022-06-03 DIAGNOSIS — S82142A Displaced bicondylar fracture of left tibia, initial encounter for closed fracture: Principal | ICD-10-CM | POA: Diagnosis present

## 2022-06-03 DIAGNOSIS — Z79899 Other long term (current) drug therapy: Secondary | ICD-10-CM | POA: Diagnosis not present

## 2022-06-03 DIAGNOSIS — S82132A Displaced fracture of medial condyle of left tibia, initial encounter for closed fracture: Secondary | ICD-10-CM

## 2022-06-03 DIAGNOSIS — I129 Hypertensive chronic kidney disease with stage 1 through stage 4 chronic kidney disease, or unspecified chronic kidney disease: Secondary | ICD-10-CM | POA: Diagnosis present

## 2022-06-03 DIAGNOSIS — E042 Nontoxic multinodular goiter: Secondary | ICD-10-CM | POA: Diagnosis present

## 2022-06-03 DIAGNOSIS — Z7982 Long term (current) use of aspirin: Secondary | ICD-10-CM

## 2022-06-03 DIAGNOSIS — Z66 Do not resuscitate: Secondary | ICD-10-CM | POA: Diagnosis present

## 2022-06-03 DIAGNOSIS — I10 Essential (primary) hypertension: Secondary | ICD-10-CM | POA: Diagnosis not present

## 2022-06-03 DIAGNOSIS — E1122 Type 2 diabetes mellitus with diabetic chronic kidney disease: Secondary | ICD-10-CM | POA: Diagnosis present

## 2022-06-03 DIAGNOSIS — E7800 Pure hypercholesterolemia, unspecified: Secondary | ICD-10-CM | POA: Diagnosis present

## 2022-06-03 DIAGNOSIS — Y9222 Religious institution as the place of occurrence of the external cause: Secondary | ICD-10-CM | POA: Diagnosis not present

## 2022-06-03 DIAGNOSIS — Z833 Family history of diabetes mellitus: Secondary | ICD-10-CM

## 2022-06-03 DIAGNOSIS — E785 Hyperlipidemia, unspecified: Secondary | ICD-10-CM | POA: Diagnosis not present

## 2022-06-03 DIAGNOSIS — Z888 Allergy status to other drugs, medicaments and biological substances status: Secondary | ICD-10-CM

## 2022-06-03 DIAGNOSIS — W19XXXA Unspecified fall, initial encounter: Principal | ICD-10-CM

## 2022-06-03 DIAGNOSIS — N189 Chronic kidney disease, unspecified: Secondary | ICD-10-CM | POA: Diagnosis present

## 2022-06-03 DIAGNOSIS — S0990XA Unspecified injury of head, initial encounter: Secondary | ICD-10-CM | POA: Diagnosis present

## 2022-06-03 DIAGNOSIS — S82402A Unspecified fracture of shaft of left fibula, initial encounter for closed fracture: Secondary | ICD-10-CM | POA: Diagnosis present

## 2022-06-03 DIAGNOSIS — W03XXXA Other fall on same level due to collision with another person, initial encounter: Secondary | ICD-10-CM | POA: Diagnosis present

## 2022-06-03 DIAGNOSIS — Z794 Long term (current) use of insulin: Secondary | ICD-10-CM

## 2022-06-03 DIAGNOSIS — E876 Hypokalemia: Secondary | ICD-10-CM | POA: Diagnosis present

## 2022-06-03 DIAGNOSIS — N182 Chronic kidney disease, stage 2 (mild): Secondary | ICD-10-CM | POA: Diagnosis present

## 2022-06-03 LAB — BASIC METABOLIC PANEL
Anion gap: 8 (ref 5–15)
BUN: 26 mg/dL — ABNORMAL HIGH (ref 8–23)
CO2: 28 mmol/L (ref 22–32)
Calcium: 10.3 mg/dL (ref 8.9–10.3)
Chloride: 103 mmol/L (ref 98–111)
Creatinine, Ser: 1.15 mg/dL — ABNORMAL HIGH (ref 0.44–1.00)
GFR, Estimated: 48 mL/min — ABNORMAL LOW (ref >60)
Glucose, Bld: 181 mg/dL — ABNORMAL HIGH (ref 70–99)
Potassium: 3.1 mmol/L — ABNORMAL LOW (ref 3.5–5.1)
Sodium: 139 mmol/L (ref 135–145)

## 2022-06-03 LAB — CBC
HCT: 42.8 % (ref 36.0–46.0)
Hemoglobin: 14.5 g/dL (ref 12.0–15.0)
MCH: 29.1 pg (ref 26.0–34.0)
MCHC: 33.9 g/dL (ref 30.0–36.0)
MCV: 85.8 fL (ref 80.0–100.0)
Platelets: 292 10*3/uL (ref 150–400)
RBC: 4.99 MIL/uL (ref 3.87–5.11)
RDW: 12.6 % (ref 11.5–15.5)
WBC: 10.6 10*3/uL — ABNORMAL HIGH (ref 4.0–10.5)
nRBC: 0 % (ref 0.0–0.2)

## 2022-06-03 MED ORDER — AMLODIPINE-OLMESARTAN 10-40 MG PO TABS: 1.0000 | ORAL_TABLET | Freq: Every day | ORAL | Status: DC

## 2022-06-03 MED ORDER — BACLOFEN 10 MG PO TABS: 5.0000 mg | ORAL_TABLET | Freq: Two times a day (BID) | ORAL | Status: DC

## 2022-06-03 MED ORDER — VITAMIN D 25 MCG (1000 UNIT) PO TABS
1000.0000 [IU] | ORAL_TABLET | Freq: Every day | ORAL | Status: DC
  Administered 2022-06-04 – 2022-06-07 (×4): 1000 [IU] via ORAL
  Filled 2022-06-03 (×6): qty 1

## 2022-06-03 MED ORDER — ATENOLOL-CHLORTHALIDONE 100-25 MG PO TABS: 1.0000 | ORAL_TABLET | Freq: Every day | ORAL | Status: DC

## 2022-06-03 MED ORDER — ATENOLOL 25 MG PO TABS
100.0000 mg | ORAL_TABLET | Freq: Every day | ORAL | Status: DC
  Administered 2022-06-04 – 2022-06-08 (×5): 100 mg via ORAL
  Filled 2022-06-03 (×5): qty 4

## 2022-06-03 MED ORDER — TIZANIDINE HCL 2 MG PO TABS: 2.0000 mg | ORAL_TABLET | Freq: Four times a day (QID) | ORAL | Status: DC | PRN

## 2022-06-03 MED ORDER — ONDANSETRON HCL 4 MG PO TABS: 4.0000 mg | ORAL_TABLET | Freq: Four times a day (QID) | ORAL | Status: DC | PRN

## 2022-06-03 MED ORDER — SIMVASTATIN 20 MG PO TABS
10.0000 mg | ORAL_TABLET | Freq: Every day | ORAL | Status: DC
  Administered 2022-06-04 – 2022-06-07 (×4): 10 mg via ORAL
  Filled 2022-06-03 (×4): qty 1

## 2022-06-03 MED ORDER — MAGNESIUM HYDROXIDE 400 MG/5ML PO SUSP
30.0000 mL | Freq: Every day | ORAL | Status: DC | PRN
  Administered 2022-06-05 – 2022-06-06 (×2): 30 mL via ORAL
  Filled 2022-06-03 (×2): qty 30

## 2022-06-03 MED ORDER — CHLORTHALIDONE 25 MG PO TABS
100.0000 mg | ORAL_TABLET | Freq: Every day | ORAL | Status: DC
  Administered 2022-06-04 – 2022-06-08 (×5): 100 mg via ORAL
  Filled 2022-06-03 (×5): qty 4

## 2022-06-03 MED ORDER — IRBESARTAN 150 MG PO TABS
300.0000 mg | ORAL_TABLET | Freq: Every day | ORAL | Status: DC
  Administered 2022-06-03 – 2022-06-08 (×5): 300 mg via ORAL
  Filled 2022-06-03 (×7): qty 2

## 2022-06-03 MED ORDER — ONDANSETRON HCL 4 MG/2ML IJ SOLN: 4.0000 mg | Freq: Four times a day (QID) | INTRAMUSCULAR | Status: DC | PRN

## 2022-06-03 MED ORDER — TRAMADOL HCL 50 MG PO TABS
50.0000 mg | ORAL_TABLET | Freq: Once | ORAL | Status: AC
  Administered 2022-06-03: 50 mg via ORAL
  Filled 2022-06-03: qty 1

## 2022-06-03 MED ORDER — SODIUM CHLORIDE 0.9 % IV SOLN: INTRAVENOUS | Status: DC

## 2022-06-03 MED ORDER — HYDROXYZINE HCL 50 MG PO TABS: 25.0000 mg | ORAL_TABLET | Freq: Three times a day (TID) | ORAL | Status: DC | PRN

## 2022-06-03 MED ORDER — ACETAMINOPHEN 500 MG PO TABS
1000.0000 mg | ORAL_TABLET | Freq: Once | ORAL | Status: AC
  Administered 2022-06-03: 1000 mg via ORAL
  Filled 2022-06-03: qty 2

## 2022-06-03 MED ORDER — ACETAMINOPHEN 650 MG RE SUPP: 650.0000 mg | Freq: Four times a day (QID) | RECTAL | Status: DC | PRN

## 2022-06-03 MED ORDER — ATENOLOL 25 MG PO TABS: 25.0000 mg | ORAL_TABLET | Freq: Every day | ORAL | Status: DC

## 2022-06-03 MED ORDER — AMLODIPINE BESYLATE 10 MG PO TABS
10.0000 mg | ORAL_TABLET | Freq: Every day | ORAL | Status: DC
  Administered 2022-06-03 – 2022-06-08 (×5): 10 mg via ORAL
  Filled 2022-06-03 (×6): qty 1

## 2022-06-03 MED ORDER — ENOXAPARIN SODIUM 40 MG/0.4ML IJ SOSY
40.0000 mg | PREFILLED_SYRINGE | INTRAMUSCULAR | Status: DC
  Administered 2022-06-04 – 2022-06-07 (×4): 40 mg via SUBCUTANEOUS
  Filled 2022-06-03 (×5): qty 0.4

## 2022-06-03 MED ORDER — DIAZEPAM 2 MG PO TABS: 2.0000 mg | ORAL_TABLET | Freq: Three times a day (TID) | ORAL | Status: DC | PRN

## 2022-06-03 MED ORDER — NITROFURANTOIN MONOHYD MACRO 100 MG PO CAPS: 100.0000 mg | ORAL_CAPSULE | Freq: Every day | ORAL | Status: DC

## 2022-06-03 MED ORDER — TRAZODONE HCL 50 MG PO TABS: 25.0000 mg | ORAL_TABLET | Freq: Every evening | ORAL | Status: DC | PRN

## 2022-06-03 MED ORDER — ACETAMINOPHEN 325 MG PO TABS
650.0000 mg | ORAL_TABLET | Freq: Four times a day (QID) | ORAL | Status: DC | PRN
  Administered 2022-06-03 – 2022-06-06 (×7): 650 mg via ORAL
  Filled 2022-06-03 (×8): qty 2

## 2022-06-03 MED ORDER — RISAQUAD PO CAPS
1.0000 | ORAL_CAPSULE | Freq: Every day | ORAL | Status: DC
  Administered 2022-06-04 – 2022-06-08 (×5): 1 via ORAL
  Filled 2022-06-03 (×5): qty 1

## 2022-06-03 NOTE — Assessment & Plan Note (Addendum)
-   This is a comminuted fracture of the left lateral tibial plateau with no significant depression.  This is associated with nondisplaced fracture of the left fibular head/neck as well as head injury. Orthopedic surgery was consulted and they were recommending immobilization of knee for 5 to 7 days and nonweightbearing for 6 weeks. No surgical intervention needed. -Continue with pain management -PT/OT evaluation

## 2022-06-03 NOTE — ED Triage Notes (Signed)
[facility]- pt fell in the hallway and a lady fell on top of her- pt has L leg and R head pain- VSS and cbg 148 per EMS- pt is A&O x4

## 2022-06-03 NOTE — Assessment & Plan Note (Deleted)
-   This is a comminuted fracture of the left lateral tibial plateau with no significant depression. - The patient will be placed in a medical-surgical bed. - Pain management will be provided. - Orthopedic consult to be obtained. - Dr. Crawford was notified about the patient and is aware. - Conservative management will be applied and the patient was placed in a knee immobilizer. 

## 2022-06-03 NOTE — H&P (Addendum)
[place]   PATIENT NAME: Tamara [name]    MR#:  [id number]  DATE OF BIRTH:  06/15/1941  DATE OF ADMISSION:  06/03/2022  PRIMARY CARE PHYSICIAN: [name], MD   Patient is coming from: Home  REQUESTING/REFERRING PHYSICIAN: Evon Slack, PA-C   CHIEF COMPLAINT:   Chief Complaint  Patient presents with   Fall    HISTORY OF PRESENT ILLNESS:  Tamara [name] is a 81 y.o. African-American female with medical history significant for type diabetes mellitus, dyslipidemia and hypertension, who presented to the emergency room with acute onset of fall.  The patient was at church at 12:30 PM today and apparently somebody tripped and fell on her knocking her to the ground and falling on her left leg.  She apparently felt that she hit her head without loss of consciousness.  She had initial dizziness though.  She denied any nausea or vomiting and was having soreness to the right side of her head.  She took an aspirin earlier today.  No headache or blurred vision or diplopia.  No paresthesias or focal muscle weakness.  She denies any neck pain or stiffness.  No back or chest pain.  She was having a hard time walking due to significant pain in her left leg.  She denies any cough or wheezing or dyspnea.  No dysuria, oliguria or hematuria or flank pain.  ED Course: When she came to the ER, BP was 158/89 with otherwise normal vital signs.  Labs revealed hypokalemia of 3.1 and a BUN of 26 with creatinine 1.15.  CBC showed WBC of 10.6 and was otherwise unremarkable.  Imaging: Left tib-fib x-ray showed nondisplaced vertically oriented proximal fibular fracture and tibial plateau fracture with comminuted fracture not excluded and recommendation for CT.  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  Left knee CT without contrast revealed the following: Comminuted fracture through the left lateral tibial plateau. No significant depression.   Nondisplaced fracture  through the fibular head/neck  The patient was given 1 g of p.o. Tylenol and 50 mg of p.o. tramadol.  She will be admitted to a medical-surgical bed for further evaluation and management. PAST MEDICAL HISTORY:   Past Medical History:  Diagnosis Date   Diabetes mellitus without complication (HCC)    Hypercholesteremia    Hyperlipidemia    Hypertension     PAST SURGICAL HISTORY:   Past Surgical History:  Procedure Laterality Date   APPENDECTOMY     COLONOSCOPY WITH PROPOFOL N/A 08/09/2018   Procedure: COLONOSCOPY WITH PROPOFOL;  Surgeon: Christena Deem, MD;  Location: [facility] ENDOSCOPY;  Service: Endoscopy;  Laterality: N/A;   EYE SURGERY      SOCIAL HISTORY:   Social History   Tobacco Use   Smoking status: Never   Smokeless tobacco: Never  Substance Use Topics   Alcohol use: No    FAMILY HISTORY:   Family History  Problem Relation Age of Onset   Diabetes type II Brother    Hypertension Brother    Bladder Cancer Neg Hx    Kidney cancer Neg Hx    Breast cancer Neg Hx     DRUG ALLERGIES:   Allergies  Allergen Reactions   Cefoxitin Diarrhea    REVIEW OF SYSTEMS:   ROS As per history of present illness. All pertinent systems were reviewed above. Constitutional, HEENT, cardiovascular, respiratory, GI, GU, musculoskeletal, neuro, psychiatric, endocrine, integumentary and hematologic systems were reviewed and are otherwise negative/unremarkable except  for positive findings mentioned above in the HPI.   MEDICATIONS AT HOME:   Prior to Admission medications   Medication Sig Start Date End Date Taking? Authorizing Provider  amLODipine-olmesartan (AZOR) 10-40 MG tablet  08/02/17   [provider]  aspirin 81 MG tablet Take 81 mg by mouth daily.    [provider]  atenolol (TENORMIN) 25 MG tablet Take by mouth.    [provider]  atenolol-chlorthalidone (TENORETIC) 100-25 MG tablet  12/27/17   [provider]  baclofen (LIORESAL)  10 MG tablet  09/03/18   [provider]  bifidobacterium infantis (ALIGN) capsule Take by mouth.    [provider]  Cholecalciferol 25 MCG (1000 UT) tablet Take 1,000 Units by mouth daily.     [provider]  diazepam (VALIUM) 2 MG tablet 1 tablet every 8 hours as needed for muscles. 01/12/18   Tommi Rumps, PA-C  hydrOXYzine (VISTARIL) 25 MG capsule  12/08/17   [provider]  meloxicam (MOBIC) 7.5 MG tablet TAKE 1 TABLET BY MOUTH EVERY DAY WITH FOOD AS NEEDED FOR PAIN 09/01/18   [provider]  metFORMIN (GLUCOPHAGE) 500 MG tablet Take 500 mg by mouth daily with breakfast.     [provider]  nitrofurantoin, macrocrystal-monohydrate, (MACROBID) 100 MG capsule Take 1 capsule (100 mg total) by mouth daily. 09/26/21   Alfredo Martinez, MD  simvastatin (ZOCOR) 10 MG tablet Take 10 mg by mouth daily at 6 PM.  07/26/15   [provider]  tiZANidine (ZANAFLEX) 2 MG tablet  08/18/19   [provider]  UNKNOWN TO PATIENT Another HTN combo med    [provider]      VITAL SIGNS:  Blood pressure (!) 158/89, pulse 71, temperature 98.2 F (36.8 C), temperature source Oral, resp. rate 16, height 5\' 5"  (1.651 m), weight 72.1 kg, SpO2 98 %.  PHYSICAL EXAMINATION:  Physical Exam  GENERAL:  81 y.o.-year-old African American female patient lying in the bed with no acute distress.  EYES: Pupils equal, round, reactive to light and accommodation. No scleral icterus. Extraocular muscles intact.  HEENT: Head atraumatic, normocephalic. Oropharynx and nasopharynx clear.  NECK:  Supple, no jugular venous distention. No thyroid enlargement, no tenderness.  LUNGS: Normal breath sounds bilaterally, no wheezing, rales,rhonchi or crepitation. No use of accessory muscles of respiration.  CARDIOVASCULAR: Regular rate and rhythm, S1, S2 normal. No murmurs, rubs, or gallops.  ABDOMEN: Soft, nondistended, nontender. Bowel sounds present. No  organomegaly or mass.  EXTREMITIES: No pedal edema, cyanosis, or clubbing. Musculoskeletal: Left knee swelling and tenderness . NEUROLOGIC: Cranial nerves II through XII are intact. Muscle strength 5/5 in all extremities. Sensation intact. Gait not checked.  PSYCHIATRIC: The patient is alert and oriented x 3.  Normal affect and good eye contact. SKIN: No obvious rash, lesion, or ulcer.   LABORATORY PANEL:   CBC Recent Labs  Lab 06/03/22 1938  WBC 10.6*  HGB 14.5  HCT 42.8  PLT 292   ------------------------------------------------------------------------------------------------------------------  Chemistries  Recent Labs  Lab 06/03/22 1938  NA 139  K 3.1*  CL 103  CO2 28  GLUCOSE 181*  BUN 26*  CREATININE 1.15*  CALCIUM 10.3   ------------------------------------------------------------------------------------------------------------------  Cardiac Enzymes No results for input(s): "TROPONINI" in the last 168 hours. ------------------------------------------------------------------------------------------------------------------  RADIOLOGY:  CT Knee Left Wo Contrast  Result Date: 06/03/2022 CLINICAL DATA:  Knee fracture EXAM: CT OF THE LEFT KNEE WITHOUT CONTRAST TECHNIQUE: Multidetector CT imaging of the left knee was  performed according to the standard protocol. Multiplanar CT image reconstructions were also generated. RADIATION DOSE REDUCTION: This exam was performed according to the departmental dose-optimization program which includes automated exposure control, adjustment of the mA and/or kV according to patient size and/or use of iterative reconstruction technique. COMPARISON:  Plain films today FINDINGS: Bones/Joint/Cartilage There is a fracture through the left lateral tibial plateau. Mildly comminuted with minimal displacement. No significant depression. Fracture also noted through the left fibular head/neck, nondisplaced. No subluxation or dislocation. Ligaments  Suboptimally assessed by CT. Muscles and Tendons Grossly unremarkable Soft tissues Moderate joint effusion. IMPRESSION: Comminuted fracture through the left lateral tibial plateau. No significant depression. Nondisplaced fracture through the fibular head/neck. Electronically Signed   By: Charlett Nose M.D.   On: 06/03/2022 19:53   CT Head Wo Contrast  Result Date: 06/03/2022 CLINICAL DATA:  Head trauma, minor (Age >= 65y) Head trauma, moderate-severe; Neck trauma (Age >= 65y) EXAM: CT HEAD WITHOUT CONTRAST CT CERVICAL SPINE WITHOUT CONTRAST TECHNIQUE: Multidetector CT imaging of the head and cervical spine was performed following the standard protocol without intravenous contrast. Multiplanar CT image reconstructions of the cervical spine were also generated. RADIATION DOSE REDUCTION: This exam was performed according to the departmental dose-optimization program which includes automated exposure control, adjustment of the mA and/or kV according to patient size and/or use of iterative reconstruction technique. COMPARISON:  10/21/2011 FINDINGS: CT HEAD FINDINGS Brain: No evidence of acute infarction, hemorrhage, hydrocephalus, extra-axial collection or mass lesion/mass effect. Scattered low-density changes within the periventricular and subcortical white matter compatible with chronic microvascular ischemic change. Mild diffuse cerebral volume loss. Vascular: Atherosclerotic calcifications involving the large vessels of the skull base. No unexpected hyperdense vessel. Skull: Normal. Negative for fracture or focal lesion. Sinuses/Orbits: No acute finding. Other: None. CT CERVICAL SPINE FINDINGS Alignment: Facet joints are aligned without dislocation or traumatic listhesis. Dens and lateral masses are aligned. Straightening of the cervical lordosis. Skull base and vertebrae: No acute fracture. No primary bone lesion or focal pathologic process. Soft tissues and spinal canal: No prevertebral fluid or swelling. No  visible canal hematoma. Disc levels:  Moderate multilevel cervical spondylosis. Upper chest: Negative. Other: Two nodules in the right thyroid lobe each measuring 1.7 cm. IMPRESSION: 1. No acute intracranial abnormality. 2. No acute fracture or subluxation of the cervical spine. 3. Moderate multilevel cervical spondylosis. 4. Two nodules in the right thyroid lobe each measuring 1.7 cm. Recommend nonemergent thyroid US (ref: J Am Coll Radiol. 2015 Feb;12(2): 143-50). Electronically Signed   By: Duanne Guess D.O.   On: 06/03/2022 16:34   CT Cervical Spine Wo Contrast  Result Date: 06/03/2022 CLINICAL DATA:  Head trauma, minor (Age >= 65y) Head trauma, moderate-severe; Neck trauma (Age >= 65y) EXAM: CT HEAD WITHOUT CONTRAST CT CERVICAL SPINE WITHOUT CONTRAST TECHNIQUE: Multidetector CT imaging of the head and cervical spine was performed following the standard protocol without intravenous contrast. Multiplanar CT image reconstructions of the cervical spine were also generated. RADIATION DOSE REDUCTION: This exam was performed according to the departmental dose-optimization program which includes automated exposure control, adjustment of the mA and/or kV according to patient size and/or use of iterative reconstruction technique. COMPARISON:  10/21/2011 FINDINGS: CT HEAD FINDINGS Brain: No evidence of acute infarction, hemorrhage, hydrocephalus, extra-axial collection or mass lesion/mass effect. Scattered low-density changes within the periventricular and subcortical white matter compatible with chronic microvascular ischemic change. Mild diffuse cerebral volume loss. Vascular: Atherosclerotic calcifications involving the large vessels of the skull base. No unexpected hyperdense vessel.  Skull: Normal. Negative for fracture or focal lesion. Sinuses/Orbits: No acute finding. Other: None. CT CERVICAL SPINE FINDINGS Alignment: Facet joints are aligned without dislocation or traumatic listhesis. Dens and lateral  masses are aligned. Straightening of the cervical lordosis. Skull base and vertebrae: No acute fracture. No primary bone lesion or focal pathologic process. Soft tissues and spinal canal: No prevertebral fluid or swelling. No visible canal hematoma. Disc levels:  Moderate multilevel cervical spondylosis. Upper chest: Negative. Other: Two nodules in the right thyroid lobe each measuring 1.7 cm. IMPRESSION: 1. No acute intracranial abnormality. 2. No acute fracture or subluxation of the cervical spine. 3. Moderate multilevel cervical spondylosis. 4. Two nodules in the right thyroid lobe each measuring 1.7 cm. Recommend nonemergent thyroid US (ref: J Am Coll Radiol. 2015 Feb;12(2): 143-50). Electronically Signed   By: Duanne GuessNicholas  Plundo D.O.   On: 06/03/2022 16:34   DG Tibia/Fibula Left  Result Date: 06/03/2022 CLINICAL DATA:  Pain after fall. EXAM: LEFT TIBIA AND FIBULA - 2 VIEW COMPARISON:  None Available. FINDINGS: a there is a nondisplaced vertically oriented fracture through the proximal fibula on the AP view. There is a tibial plateau fracture extending from just medial to the tibial spines at the joint line through the lateral aspect of the proximal tibia more inferiorly. A comminuted fracture is not excluded. Limited views of the distal femur are normal. The remainder of the tibia and fibula are intact. IMPRESSION: 1. Nondisplaced vertically oriented proximal fibular fracture. 2. Tibial plateau fracture as above. A comminuted tibial plateau fracture is not excluded. CT imaging could better evaluate the tibial plateau fracture. If CT imaging is not pursued, recommend a knee x-ray for better evaluation. 3. 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 Electronically Signed   By: Gerome Samavid  Williams III M.D.   On: 06/03/2022 16:08      IMPRESSION AND PLAN:  Assessment and Plan: * Closed fracture of left tibial plateau - This is a comminuted fracture of the left lateral tibial plateau with no  significant depression.  This is associated with nondisplaced fracture of the left fibular head/neck as well as head injury. - The patient will be placed in a medical-surgical bed. - Pain management will be provided. - Orthopedic consult to be obtained. - Dr. Okey Duprerawford was notified about the patient and is aware. - Conservative management will be applied and the patient was placed in a knee immobilizer.  Closed left fibular fracture - Pain management as above.  Type 2 diabetes mellitus with chronic kidney disease, with long-term current use of insulin (HCC) - This is associated with stage IIa chronic disease. - The patient will be placed on supplement coverage with NovoLog. - We will hold off metformin.  Essential hypertension - We will continue her antihypertensives.  Dyslipidemia - We will continue to therapy.     DVT prophylaxis: Lovenox.  Advanced Care Planning:  Code Status: She is DNR/DNI.  This was discussed with her. Family Communication:  The plan of care was discussed in details with the patient (and family). I answered all questions. The patient agreed to proceed with the above mentioned plan. Further management will depend upon hospital course. Disposition Plan: Back to previous home environment Consults called: Orthopedic consult All the records are reviewed and case discussed with ED provider.  Status is: Inpatient    At the time of the admission, it appears that the appropriate admission status for this patient is inpatient.  This is judged to be reasonable and necessary in order  to provide the required intensity of service to ensure the patient's safety given the presenting symptoms, physical exam findings and initial radiographic and laboratory data in the context of comorbid conditions.  The patient requires inpatient status due to high intensity of service, high risk of further deterioration and high frequency of surveillance required.  I certify that at the  time of admission, it is my clinical judgment that the patient will require inpatient hospital care extending more than 2 midnights.                            Dispo: The patient is from: Home              Anticipated d/c is to: Home              Patient currently is not medically stable to d/c.              Difficult to place patient: No  Hannah Beat M.D on 06/03/2022 at 9:26 PM  [facility]   From 7 PM-7 AM, contact night-coverage [email or web address]  CC: Primary care physician; [name], MD

## 2022-06-03 NOTE — Assessment & Plan Note (Deleted)
-   This is a comminuted fracture of the left lateral tibial plateau with no significant depression. - The patient will be placed in a medical-surgical bed. - Pain management will be provided. - Orthopedic consult to be obtained. - Dr. Okey Dupre was notified about the patient and is aware. - Conservative management will be applied and the patient was placed in a knee immobilizer.

## 2022-06-03 NOTE — ED Provider Triage Note (Signed)
Emergency Medicine Provider Triage Evaluation Note  Tamara [name] , a 81 y.o. female  was evaluated in triage.  Pt complains of left lower leg pain after someone at church fell on her. She hit her head on the ground. Unsure of loss of conscious.   Physical Exam  There were no vitals taken for this visit. Gen:   Awake, no distress   Resp:  Normal effort  MSK:   Moves extremities without difficulty  Other:    Medical Decision Making  Medically screening exam initiated at 1:47 PM.  Appropriate orders placed.  Tamara [name] was informed that the remainder of the evaluation will be completed by another provider, this initial triage assessment does not replace that evaluation, and the importance of remaining in the ED until their evaluation is complete.    Chinita Pester, FNP 06/03/22 1358

## 2022-06-03 NOTE — ED Provider Notes (Signed)
[facility] EMERGENCY DEPARTMENT Provider Note   CSN: [id number] Arrival date & time: 06/03/22  1337     History  Chief Complaint  Patient presents with   Tamara [name]    [name] is a 81 y.o. female with history of hypertension, hyperlipidemia and diabetes mellitus type 2 presents to the emergency department for evaluation of a fall.  Patient states she was at church around 12:30 PM today, someone tripped and fell knocking her to the ground, they fell on top of her left leg.  When she fell down she thinks she may have hit her head, she initially felt dazed, no LOC nausea vomiting.  She has some soreness to the right side of the head.  She takes aspirin 81 mg daily.  She denies any severe headache, vision changes, weakness, fatigue, confusion.  No neck pain or back pain.  No pain throughout the upper extremities.  No pain in the lower back chest or abdomen.  She complains of left anterior proximal tibia pain with no pain throughout the thigh or ankle in the left lower leg.  She has a hard time walking due to the pain in the left leg.  She has not had any medications for pain HPI     Home Medications Prior to Admission medications   Medication Sig Start Date End Date Taking? Authorizing Provider  amLODipine-olmesartan (AZOR) 10-40 MG tablet  08/02/17   [provider]  aspirin 81 MG tablet Take 81 mg by mouth daily.    [provider]  atenolol (TENORMIN) 25 MG tablet Take by mouth.    [provider]  atenolol-chlorthalidone (TENORETIC) 100-25 MG tablet  12/27/17   [provider]  baclofen (LIORESAL) 10 MG tablet  09/03/18   [provider]  bifidobacterium infantis (ALIGN) capsule Take by mouth.    [provider]  Cholecalciferol 25 MCG (1000 UT) tablet Take 1,000 Units by mouth daily.     [provider]  diazepam (VALIUM) 2 MG tablet 1 tablet every 8 hours as needed for muscles. 01/12/18   Tommi RumpsSummers,  Rhonda L, PA-C  hydrOXYzine (VISTARIL) 25 MG capsule  12/08/17   [provider]  meloxicam (MOBIC) 7.5 MG tablet TAKE 1 TABLET BY MOUTH EVERY DAY WITH FOOD AS NEEDED FOR PAIN 09/01/18   [provider]  metFORMIN (GLUCOPHAGE) 500 MG tablet Take 500 mg by mouth daily with breakfast.     [provider]  nitrofurantoin, macrocrystal-monohydrate, (MACROBID) 100 MG capsule Take 1 capsule (100 mg total) by mouth daily. 09/26/21   Alfredo MartinezMacDiarmid, Scott, MD  simvastatin (ZOCOR) 10 MG tablet Take 10 mg by mouth daily at 6 PM.  07/26/15   [provider]  tiZANidine (ZANAFLEX) 2 MG tablet  08/18/19   [provider]  UNKNOWN TO PATIENT Another HTN combo med    [provider]      Allergies    Cefoxitin    Review of Systems   Review of Systems  Physical Exam Updated Vital Signs BP (!) 158/89 (BP Location: Right Arm)   Pulse 71   Temp 98.2 F (36.8 C) (Oral)   Resp 16   Ht 5\' 5"  (1.651 m)   Wt 72.1 kg   SpO2 98%   BMI 26.46 kg/m  Physical Exam Constitutional:      General: She is not in acute distress.    Appearance: Normal appearance. She is well-developed. She is not ill-appearing.  HENT:  Head: Normocephalic and atraumatic.     Right Ear: External ear normal.     Left Ear: External ear normal.     Nose: Nose normal.     Mouth/Throat:     Mouth: Mucous membranes are moist.     Pharynx: No oropharyngeal exudate or posterior oropharyngeal erythema.  Eyes:     Conjunctiva/sclera: Conjunctivae normal.  Cardiovascular:     Rate and Rhythm: Normal rate.     Pulses: Normal pulses.     Heart sounds: Normal heart sounds.  Pulmonary:     Effort: Pulmonary effort is normal. No respiratory distress.  Abdominal:     General: Abdomen is flat. There is no distension.     Tenderness: There is no abdominal tenderness. There is no guarding.  Musculoskeletal:        General: Normal range of motion.     Cervical back: Normal range of motion and  neck supple. No tenderness.     Comments: Left lower leg, patient able tolerate internal ex rotation of the hip with no discomfort.  Leg lengths are equal.  Her knee is without effusion.  She is able to partially actively straight leg raise but has pain with attempted active extension of the knee.  No significant defect in quad tendon or patellar tendon.  Patella seems to be tracking well.  Calf is nontender to palpation.  No tenderness throughout the ankle medial lateral malleolus.  She is tender palpation along the anterior proximal tibia with mild soft tissue swelling, no skin breakdown noted.  No tenderness or swelling throughout the right lower leg.  Skin:    General: Skin is warm.     Capillary Refill: Capillary refill takes less than 2 seconds.     Findings: No rash.  Neurological:     General: No focal deficit present.     Mental Status: She is alert and oriented to person, place, and time. Mental status is at baseline.     Cranial Nerves: No cranial nerve deficit.     Motor: No weakness.  Psychiatric:        Behavior: Behavior normal.        Thought Content: Thought content normal.     ED Results / Procedures / Treatments   Labs (all labs ordered are listed, but only abnormal results are displayed) Labs Reviewed  CBC  BASIC METABOLIC PANEL    EKG None  Radiology CT Head Wo Contrast  Result Date: 06/03/2022 CLINICAL DATA:  Head trauma, minor (Age >= 65y) Head trauma, moderate-severe; Neck trauma (Age >= 65y) EXAM: CT HEAD WITHOUT CONTRAST CT CERVICAL SPINE WITHOUT CONTRAST TECHNIQUE: Multidetector CT imaging of the head and cervical spine was performed following the standard protocol without intravenous contrast. Multiplanar CT image reconstructions of the cervical spine were also generated. RADIATION DOSE REDUCTION: This exam was performed according to the departmental dose-optimization program which includes automated exposure control, adjustment of the mA and/or kV according  to patient size and/or use of iterative reconstruction technique. COMPARISON:  10/21/2011 FINDINGS: CT HEAD FINDINGS Brain: No evidence of acute infarction, hemorrhage, hydrocephalus, extra-axial collection or mass lesion/mass effect. Scattered low-density changes within the periventricular and subcortical white matter compatible with chronic microvascular ischemic change. Mild diffuse cerebral volume loss. Vascular: Atherosclerotic calcifications involving the large vessels of the skull base. No unexpected hyperdense vessel. Skull: Normal. Negative for fracture or focal lesion. Sinuses/Orbits: No acute finding. Other: None. CT CERVICAL SPINE FINDINGS Alignment: Facet joints are aligned without dislocation or traumatic  listhesis. Dens and lateral masses are aligned. Straightening of the cervical lordosis. Skull base and vertebrae: No acute fracture. No primary bone lesion or focal pathologic process. Soft tissues and spinal canal: No prevertebral fluid or swelling. No visible canal hematoma. Disc levels:  Moderate multilevel cervical spondylosis. Upper chest: Negative. Other: Two nodules in the right thyroid lobe each measuring 1.7 cm. IMPRESSION: 1. No acute intracranial abnormality. 2. No acute fracture or subluxation of the cervical spine. 3. Moderate multilevel cervical spondylosis. 4. Two nodules in the right thyroid lobe each measuring 1.7 cm. Recommend nonemergent thyroid US (ref: J Am Coll Radiol. 2015 Feb;12(2): 143-50). Electronically Signed   By: Duanne Guess D.O.   On: 06/03/2022 16:34   CT Cervical Spine Wo Contrast  Result Date: 06/03/2022 CLINICAL DATA:  Head trauma, minor (Age >= 65y) Head trauma, moderate-severe; Neck trauma (Age >= 65y) EXAM: CT HEAD WITHOUT CONTRAST CT CERVICAL SPINE WITHOUT CONTRAST TECHNIQUE: Multidetector CT imaging of the head and cervical spine was performed following the standard protocol without intravenous contrast. Multiplanar CT image reconstructions of the  cervical spine were also generated. RADIATION DOSE REDUCTION: This exam was performed according to the departmental dose-optimization program which includes automated exposure control, adjustment of the mA and/or kV according to patient size and/or use of iterative reconstruction technique. COMPARISON:  10/21/2011 FINDINGS: CT HEAD FINDINGS Brain: No evidence of acute infarction, hemorrhage, hydrocephalus, extra-axial collection or mass lesion/mass effect. Scattered low-density changes within the periventricular and subcortical white matter compatible with chronic microvascular ischemic change. Mild diffuse cerebral volume loss. Vascular: Atherosclerotic calcifications involving the large vessels of the skull base. No unexpected hyperdense vessel. Skull: Normal. Negative for fracture or focal lesion. Sinuses/Orbits: No acute finding. Other: None. CT CERVICAL SPINE FINDINGS Alignment: Facet joints are aligned without dislocation or traumatic listhesis. Dens and lateral masses are aligned. Straightening of the cervical lordosis. Skull base and vertebrae: No acute fracture. No primary bone lesion or focal pathologic process. Soft tissues and spinal canal: No prevertebral fluid or swelling. No visible canal hematoma. Disc levels:  Moderate multilevel cervical spondylosis. Upper chest: Negative. Other: Two nodules in the right thyroid lobe each measuring 1.7 cm. IMPRESSION: 1. No acute intracranial abnormality. 2. No acute fracture or subluxation of the cervical spine. 3. Moderate multilevel cervical spondylosis. 4. Two nodules in the right thyroid lobe each measuring 1.7 cm. Recommend nonemergent thyroid US (ref: J Am Coll Radiol. 2015 Feb;12(2): 143-50). Electronically Signed   By: Duanne Guess D.O.   On: 06/03/2022 16:34   DG Tibia/Fibula Left  Result Date: 06/03/2022 CLINICAL DATA:  Pain after fall. EXAM: LEFT TIBIA AND FIBULA - 2 VIEW COMPARISON:  None Available. FINDINGS: a there is a nondisplaced  vertically oriented fracture through the proximal fibula on the AP view. There is a tibial plateau fracture extending from just medial to the tibial spines at the joint line through the lateral aspect of the proximal tibia more inferiorly. A comminuted fracture is not excluded. Limited views of the distal femur are normal. The remainder of the tibia and fibula are intact. IMPRESSION: 1. Nondisplaced vertically oriented proximal fibular fracture. 2. Tibial plateau fracture as above. A comminuted tibial plateau fracture is not excluded. CT imaging could better evaluate the tibial plateau fracture. If CT imaging is not pursued, recommend a knee x-ray for better evaluation. 3. 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 Electronically Signed   By: Gerome Sam III M.D.   On: 06/03/2022 16:08    Procedures Procedures  Medications Ordered in ED Medications  acetaminophen (TYLENOL) tablet 1,000 mg (1,000 mg Oral Given 06/03/22 1628)  traMADol (ULTRAM) tablet 50 mg (50 mg Oral Given 06/03/22 1657)    ED Course/ Medical Decision Making/ A&P                           Medical Decision Making Amount and/or Complexity of Data Reviewed Radiology: ordered.  Risk OTC drugs. Prescription drug management.   81 year old female with accident earlier today where someone at church fell on her knocked her down to the ground.  Someone fell on the patient's left leg, x-rays show a left nondisplaced tibial plateau fracture and a nondisplaced left proximal fibula fracture.  CT of the head and cervical spine negative for any acute process.  Patient's pain within the left knee moderate to severe and limiting her mobility.  Patient was given Tylenol and tramadol with some relief in pain as well as a knee immobilizer.  Patient lives at home alone with 4 steps to get in and out of her home.  She was placed into a knee immobilizer and with pain controlled we attempted mobility with a walker with nonweightbearing  restrictions to the left lower extremity.  Patient unable to successfully ambulate.  Patient needing moderate assistance with walker to stand as well as to take a few steps.  I do not feel as if it safe for patient to be discharged home, would recommend admission to the hospital for orthopedic and PT consult in the morning.  Will order blood work.  Discussed case with orthopedic surgeon who is in agreement with plan, recommended CT of the left knee, will place order for CT scan.   CT scan is x-ray shows comminuted nondisplaced lateral tibial plateau fracture with proximal fibula fracture.  Patient unable to ambulate safely with walker and nonweightbearing restrictions on the left leg.  Discussed case with hospitalist and orthopedist.  Hospitalist agreeable to admission.  Patient would benefit from Ortho consult/PT consult in the morning to discuss proper discharge planning/placement   Final Clinical Impression(s) / ED Diagnoses Final diagnoses:  Fall, initial encounter  Closed fracture of medial portion of left tibial plateau, initial encounter  Other closed fracture of proximal end of left fibula, initial encounter  Injury of head, initial encounter    Rx / DC Orders ED Discharge Orders     None         Ronnette Juniper 06/03/22 2019    Concha Se, MD 06/05/22 1144

## 2022-06-03 NOTE — Assessment & Plan Note (Signed)
-   We will continue to therapy.

## 2022-06-03 NOTE — Assessment & Plan Note (Signed)
-   Pain management as above. 

## 2022-06-03 NOTE — Assessment & Plan Note (Signed)
Blood pressure mildly elevated, pain can be contributory. - Continue her antihypertensives.

## 2022-06-03 NOTE — Assessment & Plan Note (Signed)
-   This is associated with stage IIa chronic disease. - The patient will be placed on supplement coverage with NovoLog. - We will hold off metformin.

## 2022-06-04 LAB — CBC
HCT: 38.8 % (ref 36.0–46.0)
Hemoglobin: 13.2 g/dL (ref 12.0–15.0)
MCH: 28.7 pg (ref 26.0–34.0)
MCHC: 34 g/dL (ref 30.0–36.0)
MCV: 84.3 fL (ref 80.0–100.0)
Platelets: 259 10*3/uL (ref 150–400)
RBC: 4.6 MIL/uL (ref 3.87–5.11)
RDW: 12.7 % (ref 11.5–15.5)
WBC: 7.3 10*3/uL (ref 4.0–10.5)
nRBC: 0 % (ref 0.0–0.2)

## 2022-06-04 LAB — BASIC METABOLIC PANEL
Anion gap: 7 (ref 5–15)
BUN: 20 mg/dL (ref 8–23)
CO2: 25 mmol/L (ref 22–32)
Calcium: 9.6 mg/dL (ref 8.9–10.3)
Chloride: 106 mmol/L (ref 98–111)
Creatinine, Ser: 0.93 mg/dL (ref 0.44–1.00)
GFR, Estimated: >60 mL/min (ref >60)
Glucose, Bld: 137 mg/dL — ABNORMAL HIGH (ref 70–99)
Potassium: 3.5 mmol/L (ref 3.5–5.1)
Sodium: 138 mmol/L (ref 135–145)

## 2022-06-04 LAB — GLUCOSE, CAPILLARY
Glucose-Capillary: 310 mg/dL — ABNORMAL HIGH (ref 70–99)
Glucose-Capillary: 96 mg/dL (ref 70–99)
Glucose-Capillary: 234 mg/dL — ABNORMAL HIGH (ref 70–99)

## 2022-06-04 MED ORDER — INSULIN ASPART 100 UNIT/ML IJ SOLN
0.0000 [IU] | Freq: Every day | INTRAMUSCULAR | Status: DC
  Administered 2022-06-04: 2 [IU] via SUBCUTANEOUS
  Administered 2022-06-05: 3 [IU] via SUBCUTANEOUS
  Administered 2022-06-07: 2 [IU] via SUBCUTANEOUS
  Filled 2022-06-04 (×3): qty 1

## 2022-06-04 MED ORDER — INSULIN ASPART 100 UNIT/ML IJ SOLN
0.0000 [IU] | Freq: Three times a day (TID) | INTRAMUSCULAR | Status: DC
  Administered 2022-06-04: 7 [IU] via SUBCUTANEOUS
  Administered 2022-06-05: 1 [IU] via SUBCUTANEOUS
  Administered 2022-06-05 (×2): 2 [IU] via SUBCUTANEOUS
  Administered 2022-06-06: 1 [IU] via SUBCUTANEOUS
  Administered 2022-06-06: 2 [IU] via SUBCUTANEOUS
  Administered 2022-06-06 – 2022-06-07 (×2): 1 [IU] via SUBCUTANEOUS
  Administered 2022-06-07: 3 [IU] via SUBCUTANEOUS
  Administered 2022-06-07: 1 [IU] via SUBCUTANEOUS
  Administered 2022-06-08: 3 [IU] via SUBCUTANEOUS
  Administered 2022-06-08: 2 [IU] via SUBCUTANEOUS
  Filled 2022-06-04 (×12): qty 1

## 2022-06-04 NOTE — Evaluation (Signed)
Physical Therapy Evaluation Patient Details Name: Tamara [name] MRN: [id number] DOB: 02-09-1941 Today's Date: 06/04/2022  History of Present Illness  Pt is a 81 y/o F admitted on 06/03/22 after presenting following a fall. Imaging revealed comminuted fracture through the left lateral tibial plateau & nondisplaced fracture through the fibular head/neck. Orthopedics was consulted & pt felt to have non-operative fx with recommendation of TTWB with use of a knee immobilizer x 5-7 days for comfort, then okay to start gentle knee ROM. PMH: DM, dyslipidemia, HTN  Clinical Impression  Pt seen for PT evaluation with pt agreeable, daughter Tamara [name]) present. Prior to admission pt was independent without AD, driving, living alone. On this date, PT educates pt & daughter on TTWB LLE & use of KI x 5-7 days. Pt requires significant assistance (mod assist) for bed mobility but can complete STS & stand pivot with RW & min assist. Pt is able to take ~3 steps forwards with RW & min assist but very poor foot clearance. Educated pt & daughter on possible d/c to SNF for STR vs home with HHPT with use of RW, BSC, & w/c. Pt & daughter would like to weigh options as daughter lives in [place]. Will continue to follow pt acutely to address balance, strengthening, transfers, bed mobility, and gait with LRAD.     Recommendations for follow up therapy are one component of a multi-disciplinary discharge planning process, led by the attending physician.  Recommendations may be updated based on patient status, additional functional criteria and insurance authorization.  Follow Up Recommendations Skilled nursing-short term rehab (<3 hours/day) Can patient physically be transported by private vehicle: No    Assistance Recommended at Discharge Frequent or constant Supervision/Assistance  Patient can return home with the following  A lot of help with walking and/or transfers;A lot of help with bathing/dressing/bathroom;Assist for  transportation;Assistance with cooking/housework;Help with stairs or ramp for entrance    Equipment Recommendations Rolling walker (2 wheels);Wheelchair cushion (measurements PT);BSC/3in1;Wheelchair (measurements PT)  Recommendations for Other Services  OT consult    Functional Status Assessment Patient has had a recent decline in their functional status and demonstrates the ability to make significant improvements in function in a reasonable and predictable amount of time.     Precautions / Restrictions Precautions Precautions: Fall Required Braces or Orthoses: Knee Immobilizer - Left Knee Immobilizer - Left: Other (comment) (knee immobilizer x 5-7 days for comfort, then okay to start gentle knee ROM) Restrictions Weight Bearing Restrictions: Yes LLE Weight Bearing: Touchdown weight bearing      Mobility  Bed Mobility Overal bed mobility: Needs Assistance Bed Mobility: Supine to Sit     Supine to sit: Mod assist, HOB elevated     General bed mobility comments: extra time, use of bed rails, assistance to move LLE to EOB    Transfers Overall transfer level: Needs assistance Equipment used: Rolling walker (2 wheels) Transfers: Sit to/from Stand, Bed to chair/wheelchair/BSC Sit to Stand: Min assist Stand pivot transfers: Min assist         General transfer comment: Cuing for safe hand placement during STS transfers with RW. Pt does attempt stand pivot without AD but unable so utilized RW. Pt requires cuing for stand pivot with RW with pt scooting RLE across floor vs hopping.    Ambulation/Gait Ambulation/Gait assistance: Min assist Gait Distance (Feet): 2 Feet Assistive device: Rolling walker (2 wheels) Gait Pattern/deviations: Step-to pattern Gait velocity: decreased     General Gait Details: Pt maintains NWB LLE. Pt is  able to hop forwards x 3 but great diffulty hopping backwards. Pt demosntrates poor RLE foot clearance. PT provides ongoing cuing re: use of RW to  assist.  Stairs            Wheelchair Mobility    Modified Rankin (Stroke Patients Only)       Balance Overall balance assessment: Needs assistance Sitting-balance support: Feet supported, Bilateral upper extremity supported Sitting balance-Leahy Scale: Fair Sitting balance - Comments: supervision static sitting   Standing balance support: Bilateral upper extremity supported, During functional activity, Reliant on assistive device for balance Standing balance-Leahy Scale: Poor                               Pertinent Vitals/Pain Pain Assessment Pain Assessment: Faces Faces Pain Scale: Hurts little more Pain Location: LLE with movement Pain Descriptors / Indicators: Discomfort, Guarding Pain Intervention(s): Monitored during session    Home Living Family/patient expects to be discharged to:: Private residence Living Arrangements: Alone Available Help at Discharge: Family (pt can d/c to daughter's house in [place] where she can provide 24 hr assist) Type of Home: House Home Access: Stairs to enter Entrance Stairs-Rails: None Entrance Stairs-Number of Steps: 4   Home Layout: One level Home Equipment: None      Prior Function Prior Level of Function : Independent/Modified Independent;Driving                     Hand Dominance        Extremity/Trunk Assessment   Upper Extremity Assessment Upper Extremity Assessment: Overall WFL for tasks assessed    Lower Extremity Assessment Lower Extremity Assessment: Generalized weakness (LLE in KI)       Communication   Communication: No difficulties  Cognition Arousal/Alertness: Awake/alert Behavior During Therapy: WFL for tasks assessed/performed Overall Cognitive Status: Within Functional Limits for tasks assessed                                          General Comments General comments (skin integrity, edema, etc.): Pt with continent void on BSC, performing peri hygiene in  sitting. PT assisted with changing into clean gown. Educated pt & daughter on proper positioning of L KI.    Exercises     Assessment/Plan    PT Assessment Patient needs continued PT services  PT Problem List Decreased strength;Pain;Decreased activity tolerance;Decreased knowledge of use of DME;Decreased balance;Decreased mobility;Decreased safety awareness;Decreased knowledge of precautions       PT Treatment Interventions Therapeutic exercise;DME instruction;Gait training;Balance training;Stair training;Neuromuscular re-education;Functional mobility training;Therapeutic activities;Patient/family education    PT Goals (Current goals can be found in the Care Plan section)  Acute Rehab PT Goals Patient Stated Goal: get better PT Goal Formulation: With patient/family Time For Goal Achievement: 06/18/22 Potential to Achieve Goals: Good Additional Goals Additional Goal #1: Pt will propel w/c x 150 ft with mod I for increased independence with functional mobility.    Frequency 7X/week     Co-evaluation               AM-PAC PT "6 Clicks" Mobility  Outcome Measure Help needed turning from your back to your side while in a flat bed without using bedrails?: A Lot Help needed moving from lying on your back to sitting on the side of a flat bed without using bedrails?: A Lot  Help needed moving to and from a bed to a chair (including a wheelchair)?: A Little Help needed standing up from a chair using your arms (e.g., wheelchair or bedside chair)?: A Little Help needed to walk in hospital room?: A Lot Help needed climbing 3-5 steps with a railing? : Total 6 Click Score: 13    End of Session Equipment Utilized During Treatment: Left knee immobilizer Activity Tolerance: Patient tolerated treatment well Patient left: in chair;with chair alarm set;with call bell/phone within reach;with family/visitor present Nurse Communication: Mobility status PT Visit Diagnosis: Other abnormalities  of gait and mobility (R26.89);Unsteadiness on feet (R26.81);Difficulty in walking, not elsewhere classified (R26.2);Muscle weakness (generalized) (M62.81);Pain Pain - Right/Left: Left Pain - part of body: Knee    Time: 6712-4580 PT Time Calculation (min) (ACUTE ONLY): 44 min   Charges:   PT Evaluation $PT Eval Low Complexity: 1 Low PT Treatments $Gait Training: 8-22 mins $Therapeutic Activity: 8-22 mins        Aleda Grana, PT, DPT 06/04/22, 3:20 PM   Sandi Mariscal 06/04/2022, 3:18 PM

## 2022-06-04 NOTE — Assessment & Plan Note (Signed)
Improved with repletion. -Continue to monitor and replete as needed

## 2022-06-04 NOTE — Care Plan (Signed)
81yo F admitted with a non-displaced, non-depressed comminuted left lateral tibial plateau and fibular head fracture. This is a non-operative fracture.  - Pain control - PT/OT: NWB/TTWB x 6 weeks with use of a knee immobilizer x 5-7 days for comfort, then ok to start gentle knee ROM  Ross Marcus
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
## 2022-06-04 NOTE — Plan of Care (Signed)

## 2022-06-05 LAB — GLUCOSE, CAPILLARY
Glucose-Capillary: 145 mg/dL — ABNORMAL HIGH (ref 70–99)
Glucose-Capillary: 152 mg/dL — ABNORMAL HIGH (ref 70–99)
Glucose-Capillary: 175 mg/dL — ABNORMAL HIGH (ref 70–99)
Glucose-Capillary: 252 mg/dL — ABNORMAL HIGH (ref 70–99)

## 2022-06-05 LAB — HEMOGLOBIN A1C
Hgb A1c MFr Bld: 6.4 % — ABNORMAL HIGH (ref 4.8–5.6)
Mean Plasma Glucose: 137 mg/dL

## 2022-06-05 MED ORDER — TRAMADOL HCL 50 MG PO TABS
50.0000 mg | ORAL_TABLET | Freq: Four times a day (QID) | ORAL | Status: DC | PRN
  Administered 2022-06-05 – 2022-06-07 (×4): 50 mg via ORAL
  Filled 2022-06-05 (×4): qty 1

## 2022-06-05 MED ORDER — NITROFURANTOIN MONOHYD MACRO 100 MG PO CAPS
100.0000 mg | ORAL_CAPSULE | Freq: Every day | ORAL | Status: DC
  Administered 2022-06-05 – 2022-06-08 (×4): 100 mg via ORAL
  Filled 2022-06-05 (×4): qty 1

## 2022-06-05 NOTE — Progress Notes (Signed)
Pt transferred from chair to bed; 2 assist; tolerated well while keeping TWB with walker. Checked sacral and placed foam. Socks were removed and heels checked. No evidence of pressure injury. Heels floated.

## 2022-06-05 NOTE — Plan of Care (Signed)

## 2022-06-05 NOTE — Progress Notes (Signed)
Physical Therapy Treatment Patient Details Name: VINCENT EHRLER MRN: [id number] DOB: 14-Jan-1941 Today's Date: 06/05/2022   History of Present Illness Pt is a 81 y/o F admitted on 06/03/22 after presenting following a fall. Imaging revealed comminuted fracture through the left lateral tibial plateau & nondisplaced fracture through the fibular head/neck. Orthopedics was consulted & pt felt to have non-operative fx with recommendation of TTWB with use of a knee immobilizer x 5-7 days for comfort, then okay to start gentle knee ROM. PMH: DM, dyslipidemia, HTN    PT Comments    Pt received in bed, daughter at bedside. Education provided regarding donning of L KI, B LE strengthening exercises, weight bearing restrictions, and transition to SNF. Pt continues to require ModA for supine to sit and sit to stand transfers from raised surface at RW. She is compliant with TDWB L LE, however is only able to tolerate pivoting a few feet and minimal hoping with RW. Pt encouraged to wear a supportive shoe on Right foot at rehab. Pt positioned to comfort in recliner with all needs and questions addressed. Continue PT per POC. Pt remains very motivated and anticipate good progression.   Recommendations for follow up therapy are one component of a multi-disciplinary discharge planning process, led by the attending physician.  Recommendations may be updated based on patient status, additional functional criteria and insurance authorization.  Follow Up Recommendations  Skilled nursing-short term rehab (<3 hours/day) Can patient physically be transported by private vehicle: No   Assistance Recommended at Discharge Frequent or constant Supervision/Assistance  Patient can return home with the following A lot of help with walking and/or transfers;A lot of help with bathing/dressing/bathroom;Assist for transportation;Assistance with cooking/housework;Help with stairs or ramp for entrance   Equipment Recommendations   Rolling walker (2 wheels);Wheelchair cushion (measurements PT);BSC/3in1;Wheelchair (measurements PT)    Recommendations for Other Services       Precautions / Restrictions Precautions Precautions: Fall Required Braces or Orthoses: Knee Immobilizer - Left Knee Immobilizer - Left: Other (comment) (knee immobilizer x 5-7 days for comfort, then okay to start gentle knee ROM) Restrictions Weight Bearing Restrictions: Yes LLE Weight Bearing: Touchdown weight bearing Other Position/Activity Restrictions:  (With L knee immobilizer on)     Mobility  Bed Mobility Overal bed mobility: Needs Assistance Bed Mobility: Supine to Sit, Sit to Supine     Supine to sit: Mod assist, HOB elevated     General bed mobility comments: extra time, use of bed rails, assistance to move LLE to EOB    Transfers Overall transfer level: Needs assistance Equipment used: Rolling walker (2 wheels) Transfers: Sit to/from Stand Sit to Stand: Mod assist, From elevated surface           General transfer comment: Cues for hand placement, weight shifting, and weight bearing limitations    Ambulation/Gait Ambulation/Gait assistance: Min assist Gait Distance (Feet): 2 Feet Assistive device: Rolling walker (2 wheels) Gait Pattern/deviations: Step-to pattern Gait velocity: decreased     General Gait Details: Pt able to maintain NWB L LE through pivoting on R foot with RW   Stairs             Wheelchair Mobility    Modified Rankin (Stroke Patients Only)       Balance Overall balance assessment: Needs assistance Sitting-balance support: Feet supported, Bilateral upper extremity supported Sitting balance-Leahy Scale: Fair Sitting balance - Comments: supervision static sitting   Standing balance support: Bilateral upper extremity supported, During functional activity, Reliant on assistive device for  balance Standing balance-Leahy Scale: Poor                               Cognition Arousal/Alertness: Awake/alert Behavior During Therapy: WFL for tasks assessed/performed Overall Cognitive Status: Within Functional Limits for tasks assessed                                 General Comments: Pt very pleasant and motivated        Exercises General Exercises - Lower Extremity Ankle Circles/Pumps: AROM, Both, 10 reps Quad Sets: AROM, Both, 5 reps Gluteal Sets: AROM, Both, 5 reps Heel Slides: AROM, Right, 10 reps, AAROM, Left, 5 reps Hip ABduction/ADduction: AROM, Right, AAROM, Left, 5 reps Other Exercises Other Exercises: Pt and daughter educated on chair press-ups for UE strength during transfers    General Comments General comments (skin integrity, edema, etc.): Pt and daughter educated on current LOF, recent injury, and donning of L KI. Also discussed importance of skin integrity and ok to remove brace while in bed for skin care and ice application if needed.      Pertinent Vitals/Pain Pain Assessment Pain Assessment: 0-10 Pain Score: 6  Pain Location: LLE with movement Pain Descriptors / Indicators: Discomfort, Guarding, Grimacing, Sharp Pain Intervention(s): Limited activity within patient's tolerance, Monitored during session, Premedicated before session    Home Living Family/patient expects to be discharged to:: Private residence Living Arrangements: Alone Available Help at Discharge: Family Type of Home: House Home Access: Stairs to enter Entrance Stairs-Rails: None Secretary/administrator of Steps: 4   Home Layout: One level Home Equipment: None      Prior Function            PT Goals (current goals can now be found in the care plan section) Acute Rehab PT Goals Patient Stated Goal: get better Progress towards PT goals: Progressing toward goals    Frequency    7X/week      PT Plan Current plan remains appropriate    Co-evaluation              AM-PAC PT "6 Clicks" Mobility   Outcome Measure  Help  needed turning from your back to your side while in a flat bed without using bedrails?: A Lot Help needed moving from lying on your back to sitting on the side of a flat bed without using bedrails?: A Lot Help needed moving to and from a bed to a chair (including a wheelchair)?: A Little Help needed standing up from a chair using your arms (e.g., wheelchair or bedside chair)?: A Little Help needed to walk in hospital room?: A Lot Help needed climbing 3-5 steps with a railing? : Total 6 Click Score: 13    End of Session Equipment Utilized During Treatment: Left knee immobilizer;Gait belt Activity Tolerance: Patient tolerated treatment well Patient left: in chair;with chair alarm set;with call bell/phone within reach;with family/visitor present Nurse Communication: Mobility status PT Visit Diagnosis: Other abnormalities of gait and mobility (R26.89);Unsteadiness on feet (R26.81);Difficulty in walking, not elsewhere classified (R26.2);Muscle weakness (generalized) (M62.81);Pain Pain - Right/Left: Left Pain - part of body: Knee     Time: 5621-3086 PT Time Calculation (min) (ACUTE ONLY): 49 min  Charges:  $Gait Training: 8-22 mins $Therapeutic Exercise: 8-22 mins $Therapeutic Activity: 8-22 mins  Zadie Cleverly, PTA   Jannet Askew 06/05/2022, 2:05 PM

## 2022-06-05 NOTE — Plan of Care (Signed)
Problem: Education: Goal: Knowledge of General Education information will improve Description: Including pain rating scale, medication(s)/side effects and non-pharmacologic comfort measures 06/05/2022 0741 by Orma Render, RN Outcome: Progressing 06/05/2022 0741 by Orma Render, RN Outcome: Progressing   Problem: Health Behavior/Discharge Planning: Goal: Ability to manage health-related needs will improve 06/05/2022 0741 by Orma Render, RN Outcome: Progressing 06/05/2022 0741 by Orma Render, RN Outcome: Progressing   Problem: Clinical Measurements: Goal: Ability to maintain clinical measurements within normal limits will improve 06/05/2022 0741 by Orma Render, RN Outcome: Progressing 06/05/2022 0741 by Orma Render, RN Outcome: Progressing Goal: Will remain free from infection 06/05/2022 0741 by Orma Render, RN Outcome: Progressing 06/05/2022 0741 by Orma Render, RN Outcome: Progressing Goal: Diagnostic test results will improve 06/05/2022 0741 by Orma Render, RN Outcome: Progressing 06/05/2022 0741 by Orma Render, RN Outcome: Progressing Goal: Respiratory complications will improve 06/05/2022 0741 by Orma Render, RN Outcome: Progressing 06/05/2022 0741 by Orma Render, RN Outcome: Progressing Goal: Cardiovascular complication will be avoided 06/05/2022 0741 by Orma Render, RN Outcome: Progressing 06/05/2022 0741 by Orma Render, RN Outcome: Progressing   Problem: Activity: Goal: Risk for activity intolerance will decrease 06/05/2022 0741 by Orma Render, RN Outcome: Progressing 06/05/2022 0741 by Orma Render, RN Outcome: Progressing   Problem: Nutrition: Goal: Adequate nutrition will be maintained 06/05/2022 0741 by Orma Render, RN Outcome: Progressing 06/05/2022 0741 by Orma Render, RN Outcome: Progressing   Problem: Coping: Goal: Level of anxiety will decrease 06/05/2022 0741 by Orma Render, RN Outcome: Progressing 06/05/2022 0741 by Orma Render, RN Outcome: Progressing   Problem: Elimination: Goal: Will not experience complications related to bowel motility 06/05/2022 0741 by Orma Render, RN Outcome: Progressing 06/05/2022 0741 by Orma Render, RN Outcome: Progressing Goal: Will not experience complications related to urinary retention 06/05/2022 0741 by Orma Render, RN Outcome: Progressing 06/05/2022 0741 by Orma Render, RN Outcome: Progressing   Problem: Pain Managment: Goal: General experience of comfort will improve 06/05/2022 0741 by Orma Render, RN Outcome: Progressing 06/05/2022 0741 by Orma Render, RN Outcome: Progressing   Problem: Safety: Goal: Ability to remain free from injury will improve 06/05/2022 0741 by Orma Render, RN Outcome: Progressing 06/05/2022 0741 by Orma Render, RN Outcome: Progressing   Problem: Skin Integrity: Goal: Risk for impaired skin integrity will decrease 06/05/2022 0741 by Orma Render, RN Outcome: Progressing 06/05/2022 0741 by Orma Render, RN Outcome: Progressing   Problem: Education: Goal: Ability to describe self-care measures that may prevent or decrease complications (Diabetes Survival Skills Education) will improve 06/05/2022 0741 by Orma Render, RN Outcome: Progressing 06/05/2022 0741 by Orma Render, RN Outcome: Progressing Goal: Individualized Educational Video(s) 06/05/2022 0741 by Orma Render, RN Outcome: Progressing 06/05/2022 0741 by Orma Render, RN Outcome: Progressing   Problem: Coping: Goal: Ability to adjust to condition or change in health will improve 06/05/2022 0741 by Orma Render, RN Outcome: Progressing 06/05/2022 0741 by Orma Render, RN Outcome: Progressing   Problem: Fluid Volume: Goal: Ability to maintain a balanced intake and output will improve 06/05/2022 0741 by Orma Render, RN Outcome: Progressing 06/05/2022 0741 by Orma Render, RN Outcome: Progressing   Problem: Health Behavior/Discharge Planning: Goal:  Ability to identify and utilize available resources and services will improve Outcome: Progressing Goal: Ability to manage health-related needs will improve Outcome: Progressing   Problem:  Metabolic: Goal: Ability to maintain appropriate glucose levels will improve Outcome: Progressing   Problem: Nutritional: Goal: Maintenance of adequate nutrition will improve Outcome: Progressing Goal: Progress toward achieving an optimal weight will improve Outcome: Progressing   Problem: Skin Integrity: Goal: Risk for impaired skin integrity will decrease Outcome: Progressing   Problem: Tissue Perfusion: Goal: Adequacy of tissue perfusion will improve Outcome: Progressing

## 2022-06-05 NOTE — Consult Note (Signed)
ORTHOPAEDIC CONSULTATION  REQUESTING PHYSICIAN: Arnetha Courser, MD  Chief Complaint: Left knee pain  HPI: Tamara [name] is a 81 y.o. female who complains of left knee pain after mechanical fall. The pain is sharp in character. Denies any numbness, tingling or constitutional symptoms.  X-rays and CT in the ER showed presence of a comminuted, nondisplaced lateral tibial plateau fracture and fibular neck fracture.  Orthopedics was consulted regarding further management.  Patient lives independently and does not use any assistive ambulatory device.  Past Medical History:  Diagnosis Date   Diabetes mellitus without complication (HCC)    Hypercholesteremia    Hyperlipidemia    Hypertension    Past Surgical History:  Procedure Laterality Date   APPENDECTOMY     COLONOSCOPY WITH PROPOFOL N/A 08/09/2018   Procedure: COLONOSCOPY WITH PROPOFOL;  Surgeon: Christena Deem, MD;  Location: [facility] ENDOSCOPY;  Service: Endoscopy;  Laterality: N/A;   EYE SURGERY     Social History   Socioeconomic History   Marital status: Widowed    Spouse name: Not on file   Number of children: Not on file   Years of education: Not on file   Highest education level: Not on file  Occupational History   Not on file  Tobacco Use   Smoking status: Never   Smokeless tobacco: Never  Vaping Use   Vaping Use: Never used  Substance and Sexual Activity   Alcohol use: No   Drug use: Never   Sexual activity: Not Currently    Birth control/protection: Post-menopausal  Other Topics Concern   Not on file  Social History Narrative   Not on file   Social Determinants of Health   Financial Resource Strain: Not on file  Food Insecurity: No Food Insecurity (06/04/2022)   Hunger Vital Sign    Worried About Running Out of Food in the Last Year: Never true    Ran Out of Food in the Last Year: Never true  Transportation Needs: No Transportation Needs (06/04/2022)   PRAPARE - Scientist, research (physical sciences) (Medical): No    Lack of Transportation (Non-Medical): No  Physical Activity: Not on file  Stress: Not on file  Social Connections: Not on file   Family History  Problem Relation Age of Onset   Diabetes type II Brother    Hypertension Brother    Bladder Cancer Neg Hx    Kidney cancer Neg Hx    Breast cancer Neg Hx    Allergies  Allergen Reactions   Cefoxitin Diarrhea   Prior to Admission medications   Medication Sig Start Date End Date Taking? Authorizing Provider  amLODipine-olmesartan (AZOR) 10-40 MG tablet Take 1 tablet by mouth daily. 08/02/17  Yes [provider]  aspirin 81 MG tablet Take 81 mg by mouth daily.   Yes [provider]  atenolol-chlorthalidone (TENORETIC) 100-25 MG tablet Take 1 tablet by mouth daily. 12/27/17  Yes [provider]  atorvastatin (LIPITOR) 20 MG tablet Take 20 mg by mouth daily. 05/13/22  Yes [provider]  bifidobacterium infantis (ALIGN) capsule Take 1 capsule by mouth daily.   Yes [provider]  Cholecalciferol 25 MCG (1000 UT) tablet Take 1,000 Units by mouth daily.    Yes [provider]  metFORMIN (GLUCOPHAGE) 500 MG tablet Take 500 mg by mouth daily with breakfast.    Yes [provider]  nitrofurantoin, macrocrystal-monohydrate, (MACROBID) 100 MG capsule Take 1 capsule (100 mg total) by mouth daily. 09/26/21  Yes  MacDiarmid, Lorin Picket, MD  meloxicam (MOBIC) 7.5 MG tablet TAKE 1 TABLET BY MOUTH EVERY DAY WITH FOOD AS NEEDED FOR PAIN Patient not taking: Reported on 06/03/2022 09/01/18   [provider]   CT Knee Left Wo Contrast  Result Date: 06/03/2022 CLINICAL DATA:  Knee fracture EXAM: CT OF THE LEFT KNEE WITHOUT CONTRAST TECHNIQUE: Multidetector CT imaging of the left knee was performed according to the standard protocol. Multiplanar CT image reconstructions were also generated. RADIATION DOSE REDUCTION: This exam was performed according to the departmental  dose-optimization program which includes automated exposure control, adjustment of the mA and/or kV according to patient size and/or use of iterative reconstruction technique. COMPARISON:  Plain films today FINDINGS: Bones/Joint/Cartilage There is a fracture through the left lateral tibial plateau. Mildly comminuted with minimal displacement. No significant depression. Fracture also noted through the left fibular head/neck, nondisplaced. No subluxation or dislocation. Ligaments Suboptimally assessed by CT. Muscles and Tendons Grossly unremarkable Soft tissues Moderate joint effusion. IMPRESSION: Comminuted fracture through the left lateral tibial plateau. No significant depression. Nondisplaced fracture through the fibular head/neck. Electronically Signed   By: Charlett Nose M.D.   On: 06/03/2022 19:53   CT Head Wo Contrast  Result Date: 06/03/2022 CLINICAL DATA:  Head trauma, minor (Age >= 65y) Head trauma, moderate-severe; Neck trauma (Age >= 65y) EXAM: CT HEAD WITHOUT CONTRAST CT CERVICAL SPINE WITHOUT CONTRAST TECHNIQUE: Multidetector CT imaging of the head and cervical spine was performed following the standard protocol without intravenous contrast. Multiplanar CT image reconstructions of the cervical spine were also generated. RADIATION DOSE REDUCTION: This exam was performed according to the departmental dose-optimization program which includes automated exposure control, adjustment of the mA and/or kV according to patient size and/or use of iterative reconstruction technique. COMPARISON:  10/21/2011 FINDINGS: CT HEAD FINDINGS Brain: No evidence of acute infarction, hemorrhage, hydrocephalus, extra-axial collection or mass lesion/mass effect. Scattered low-density changes within the periventricular and subcortical white matter compatible with chronic microvascular ischemic change. Mild diffuse cerebral volume loss. Vascular: Atherosclerotic calcifications involving the large vessels of the skull base. No  unexpected hyperdense vessel. Skull: Normal. Negative for fracture or focal lesion. Sinuses/Orbits: No acute finding. Other: None. CT CERVICAL SPINE FINDINGS Alignment: Facet joints are aligned without dislocation or traumatic listhesis. Dens and lateral masses are aligned. Straightening of the cervical lordosis. Skull base and vertebrae: No acute fracture. No primary bone lesion or focal pathologic process. Soft tissues and spinal canal: No prevertebral fluid or swelling. No visible canal hematoma. Disc levels:  Moderate multilevel cervical spondylosis. Upper chest: Negative. Other: Two nodules in the right thyroid lobe each measuring 1.7 cm. IMPRESSION: 1. No acute intracranial abnormality. 2. No acute fracture or subluxation of the cervical spine. 3. Moderate multilevel cervical spondylosis. 4. Two nodules in the right thyroid lobe each measuring 1.7 cm. Recommend nonemergent thyroid US (ref: J Am Coll Radiol. 2015 Feb;12(2): 143-50). Electronically Signed   By: Duanne Guess D.O.   On: 06/03/2022 16:34   CT Cervical Spine Wo Contrast  Result Date: 06/03/2022 CLINICAL DATA:  Head trauma, minor (Age >= 65y) Head trauma, moderate-severe; Neck trauma (Age >= 65y) EXAM: CT HEAD WITHOUT CONTRAST CT CERVICAL SPINE WITHOUT CONTRAST TECHNIQUE: Multidetector CT imaging of the head and cervical spine was performed following the standard protocol without intravenous contrast. Multiplanar CT image reconstructions of the cervical spine were also generated. RADIATION DOSE REDUCTION: This exam was performed according to the departmental dose-optimization program which includes automated exposure control, adjustment of the mA and/or kV  according to patient size and/or use of iterative reconstruction technique. COMPARISON:  10/21/2011 FINDINGS: CT HEAD FINDINGS Brain: No evidence of acute infarction, hemorrhage, hydrocephalus, extra-axial collection or mass lesion/mass effect. Scattered low-density changes within the  periventricular and subcortical white matter compatible with chronic microvascular ischemic change. Mild diffuse cerebral volume loss. Vascular: Atherosclerotic calcifications involving the large vessels of the skull base. No unexpected hyperdense vessel. Skull: Normal. Negative for fracture or focal lesion. Sinuses/Orbits: No acute finding. Other: None. CT CERVICAL SPINE FINDINGS Alignment: Facet joints are aligned without dislocation or traumatic listhesis. Dens and lateral masses are aligned. Straightening of the cervical lordosis. Skull base and vertebrae: No acute fracture. No primary bone lesion or focal pathologic process. Soft tissues and spinal canal: No prevertebral fluid or swelling. No visible canal hematoma. Disc levels:  Moderate multilevel cervical spondylosis. Upper chest: Negative. Other: Two nodules in the right thyroid lobe each measuring 1.7 cm. IMPRESSION: 1. No acute intracranial abnormality. 2. No acute fracture or subluxation of the cervical spine. 3. Moderate multilevel cervical spondylosis. 4. Two nodules in the right thyroid lobe each measuring 1.7 cm. Recommend nonemergent thyroid US (ref: J Am Coll Radiol. 2015 Feb;12(2): 143-50). Electronically Signed   By: Duanne GuessNicholas  Plundo D.O.   On: 06/03/2022 16:34   DG Tibia/Fibula Left  Result Date: 06/03/2022 CLINICAL DATA:  Pain after fall. EXAM: LEFT TIBIA AND FIBULA - 2 VIEW COMPARISON:  None Available. FINDINGS: a there is a nondisplaced vertically oriented fracture through the proximal fibula on the AP view. There is a tibial plateau fracture extending from just medial to the tibial spines at the joint line through the lateral aspect of the proximal tibia more inferiorly. A comminuted fracture is not excluded. Limited views of the distal femur are normal. The remainder of the tibia and fibula are intact. IMPRESSION: 1. Nondisplaced vertically oriented proximal fibular fracture. 2. Tibial plateau fracture as above. A comminuted tibial  plateau fracture is not excluded. CT imaging could better evaluate the tibial plateau fracture. If CT imaging is not pursued, recommend a knee x-ray for better evaluation. 3. 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 Electronically Signed   By: Gerome Samavid  Williams III M.D.   On: 06/03/2022 16:08    Positive ROS: All other systems have been reviewed and were otherwise negative with the exception of those mentioned in the HPI and as above.  Physical Exam: General: Alert, no acute distress Cardiovascular: No pedal edema Respiratory: No cyanosis, no use of accessory musculature GI: No organomegaly, abdomen is soft and non-tender Skin: No lesions in the area of chief complaint Neurologic: Sensation intact distally Psychiatric: Patient is competent for consent with normal mood and affect Lymphatic: No axillary or cervical lymphadenopathy  MUSCULOSKELETAL:  Left lower extremity: In a knee immobilizer which was removed.  Left knee with mild effusion, tenderness palpation about the lateral joint line.  Tolerates gentle knee range of motion.  Compartments soft. Good cap refill. Motor and sensory intact distally.  Assessment: 81 year old female admitted with a nondisplaced, comminuted left lateral tibial plateau and fibular neck fracture  Plan: I had a long discussion with the patient as well as her daughter regarding the nature of the patient's fracture as well as treatment options and expected course of recovery.  Plan for toe-touch weightbearing for approximately 6 weeks.  We discussed use of a knee immobilizer for the first week and then discontinuing the brace to allow for knee range of motion.  Patient will likely be discharged to a local  SNF for 2 weeks and then will accompany her daughter in [place] until she is able to return to her independent lifestyle.  Recommendation was made for follow-up locally in [place] with [facility] with an x-ray roughly 3 weeks from injury,  and also to establish physical therapy care.  All patient and family questions were addressed.    Ross Marcus, MD    06/05/2022 3:19 PM

## 2022-06-05 NOTE — TOC Progression Note (Signed)
Transition of Care [facility]) - Progression Note    Patient Details  Name: RENAE MOTTLEY MRN: [id number] Date of Birth: Apr 15, 1941  Transition of [facility]) CM/SW [place], RN Phone Number: 06/05/2022, 10:54 AM  Clinical Narrative:     Met with the patient and her daughter in the room, I explained options to wither go to [facility] with her daughter and [facility] or to go to [facility] SNF locally, PT recommendation is to go to [facility] SNF, they would like to stay locally, Will review bed offers once obtained  Expected Discharge Plan: [place] Barriers to Discharge: SNF Pending bed offer, Insurance Authorization  Expected Discharge Plan and Services Expected Discharge Plan: [place] arrangements for the past 2 months: Single Family Home                                       Social Determinants of Health (SDOH) Interventions    Readmission Risk Interventions     No data to display

## 2022-06-05 NOTE — NC FL2 (Signed)
MEDICAID [id number] LEVEL OF CARE FORM     IDENTIFICATION  Patient Name: Tamara [name] Birthdate: April 25, 1941 Sex: female Admission Date (Current Location): 06/03/2022  [facility] and [place] Number:  Chiropodist and Address:  [facility], [address], [place], [place] [address]      Provider Number: [id number]  Attending Physician Name and Address:  Arnetha Courser, MD  Relative Name and Phone Number:  Clydie Braun DAughter [phone]    Current Level of Care: Hospital Recommended Level of Care: Skilled Nursing Facility Prior Approval Number:    Date Approved/Denied:   PASRR Number: [id number]  Discharge Plan: SNF    Current Diagnoses: Patient Active Problem List   Diagnosis Date Noted   Closed fracture of left tibial plateau 06/03/2022   Essential hypertension 06/03/2022   Type 2 diabetes mellitus with chronic kidney disease, with long-term current use of insulin (HCC) 06/03/2022   Dyslipidemia 06/03/2022   Closed left fibular fracture 06/03/2022   Hypokalemia 06/03/2022   UTI (urinary tract infection) 01/09/2017    Orientation RESPIRATION BLADDER Height & Weight     Self, Time, Situation, Place    Continent Weight: 72.1 kg Height:  5\' 5"  (165.1 cm)  BEHAVIORAL SYMPTOMS/MOOD NEUROLOGICAL BOWEL NUTRITION STATUS      Continent    AMBULATORY STATUS COMMUNICATION OF NEEDS Skin   Extensive Assist Verbally Normal                       Personal Care Assistance Level of Assistance  Bathing, Feeding, Dressing Bathing Assistance: Maximum assistance Feeding assistance: Limited assistance Dressing Assistance: Maximum assistance     Functional Limitations Info  Sight (wears glasses)          SPECIAL CARE FACTORS FREQUENCY  PT (By licensed PT), OT (By licensed OT)     PT Frequency: 5 times per week OT Frequency: 5 times per week            Contractures Contractures Info: Not present    Additional Factors Info   Code Status, Allergies Code Status Info: DNR Allergies Info: Cefoxitin           Current Medications (06/05/2022):  This is the current hospital active medication list Current Facility-Administered Medications  Medication Dose Route Frequency Provider Last Rate Last Admin   0.9 %  sodium chloride infusion   Intravenous Continuous Mansy, Jan A, MD 100 mL/hr at 06/05/22 1044 Infusion Verify at 06/05/22 1044   acetaminophen (TYLENOL) tablet 650 mg  650 mg Oral Q6H PRN Mansy, Jan A, MD   650 mg at 06/05/22 1048   Or   acetaminophen (TYLENOL) suppository 650 mg  650 mg Rectal Q6H PRN Mansy, Jan A, MD       acidophilus (RISAQUAD) capsule 1 capsule  1 capsule Oral Daily Mansy, Jan A, MD   1 capsule at 06/05/22 0815   amLODipine (NORVASC) tablet 10 mg  10 mg Oral Daily 14/04/23, RPH   10 mg at 06/05/22 14/04/23   And   irbesartan (AVAPRO) tablet 300 mg  300 mg Oral Daily 8413, RPH   300 mg at 06/05/22 0816   atenolol (TENORMIN) tablet 100 mg  100 mg Oral Daily 14/04/23, RPH   100 mg at 06/05/22 14/04/23   And   chlorthalidone (HYGROTON) tablet 100 mg  100 mg Oral Daily 2440, RPH   100 mg at 06/05/22 14/04/23   cholecalciferol (VITAMIN D3)  25 MCG (1000 UNIT) tablet 1,000 Units  1,000 Units Oral Daily Mansy, Jan A, MD   1,000 Units at 06/05/22 0816   enoxaparin (LOVENOX) injection 40 mg  40 mg Subcutaneous Q24H Mansy, Jan A, MD   40 mg at 06/04/22 2028   hydrOXYzine (ATARAX) tablet 25 mg  25 mg Oral TID PRN Mansy, Jan A, MD       insulin aspart (novoLOG) injection 0-5 Units  0-5 Units Subcutaneous QHS Lorella Nimrod, MD   2 Units at 06/04/22 2209   insulin aspart (novoLOG) injection 0-9 Units  0-9 Units Subcutaneous TID WC Lorella Nimrod, MD   1 Units at 06/05/22 0817   magnesium hydroxide (MILK OF MAGNESIA) suspension 30 mL  30 mL Oral Daily PRN Mansy, Jan A, MD       nitrofurantoin (macrocrystal-monohydrate) (MACROBID) capsule 100 mg  100 mg Oral Daily Lorella Nimrod,  MD   100 mg at 06/05/22 1048   ondansetron (ZOFRAN) tablet 4 mg  4 mg Oral Q6H PRN Mansy, Jan A, MD       Or   ondansetron [facility]) injection 4 mg  4 mg Intravenous Q6H PRN Mansy, Jan A, MD       simvastatin (ZOCOR) tablet 10 mg  10 mg Oral q1800 Mansy, Jan A, MD   10 mg at 06/04/22 1614   traMADol (ULTRAM) tablet 50 mg  50 mg Oral Q6H PRN Lorella Nimrod, MD       traZODone (DESYREL) tablet 25 mg  25 mg Oral QHS PRN Mansy, Arvella Merles, MD         Discharge Medications: Please see discharge summary for a list of discharge medications.  Relevant Imaging Results:  Relevant Lab Results:   Additional Information SS# [id number]  Conception Oms, RN

## 2022-06-05 NOTE — Evaluation (Signed)
Occupational Therapy Evaluation Patient Details Name: Tamara [name] MRN: [id number] DOB: 1940-12-10 Today's Date: 06/05/2022   History of Present Illness Pt is a 81 y/o F admitted on 06/03/22 after presenting following a fall. Imaging revealed comminuted fracture through the left lateral tibial plateau & nondisplaced fracture through the fibular head/neck. Orthopedics was consulted & pt felt to have non-operative fx with recommendation of TTWB with use of a knee immobilizer x 5-7 days for comfort, then okay to start gentle knee ROM. PMH: DM, dyslipidemia, HTN   Clinical Impression   [name] was seen for OT evaluation on this date. Upon arrival to room pt awake/alert, semi-supine in bed with supportive family present at bedside. Pt endorsing no pain in her LLE at rest, but reports increased pain with mobility. RN in room during session to administer pain medication. Prior to hospital admission, pt was living alone in a one level home. She reports she was independent at baseline and endorses strong desire to return to PLOF. Pt presents to acute OT demonstrating impaired ADL performance and functional mobility 2/2 increased pain in her LLE, generalized weakness, and decreased functional use of her LLE (See OT problem list). Pt requires MAX A +2 for toilet transfer to/from [facility], SET UP assist for seated peri-care, and MAX A for LB ADL Management from STS. Pt would benefit from skilled OT services to address noted impairments and functional limitations (see below for any additional details) in order to maximize safety and independence while minimizing falls risk and caregiver burden. Upon hospital discharge, recommend STR to maximize pt safety and return to PLOF.         Recommendations for follow up therapy are one component of a multi-disciplinary discharge planning process, led by the attending physician.  Recommendations may be updated based on patient status, additional functional criteria and  insurance authorization.   Follow Up Recommendations  Skilled nursing-short term rehab (<3 hours/day)     Assistance Recommended at Discharge Frequent or constant Supervision/Assistance  Patient can return home with the following Two people to help with walking and/or transfers;A lot of help with bathing/dressing/bathroom;Help with stairs or ramp for entrance;Assist for transportation    Functional Status Assessment  Patient has had a recent decline in their functional status and demonstrates the ability to make significant improvements in function in a reasonable and predictable amount of time.  Equipment Recommendations  BSC/3in1    Recommendations for Other Services       Precautions / Restrictions Precautions Precautions: Fall Required Braces or Orthoses: Knee Immobilizer - Left Knee Immobilizer - Left: Other (comment) (knee immobilizer x 5-7 days for comfort, then okay to start gentle knee ROM) Restrictions Weight Bearing Restrictions: Yes LLE Weight Bearing: Touchdown weight bearing      Mobility Bed Mobility Overal bed mobility: Needs Assistance Bed Mobility: Supine to Sit, Sit to Supine     Supine to sit: Mod assist, HOB elevated Sit to supine: Max assist, +2 for physical assistance        Transfers Overall transfer level: Needs assistance Equipment used: Rolling walker (2 wheels) Transfers: Sit to/from Stand, Bed to chair/wheelchair/BSC Sit to Stand: Mod assist, Max assist, +2 physical assistance, From elevated surface Stand pivot transfers: Max assist, +2 physical assistance         General transfer comment: Cuing for safe hand placement during STS transfers with RW. Attempted x2 for STS with +1, however pt is unable to come to full stand. Requires +2 to safely transfer to [facility].  Balance Overall balance assessment: Needs assistance Sitting-balance support: Feet supported, Bilateral upper extremity supported Sitting balance-Leahy Scale:  Fair Sitting balance - Comments: supervision static sitting   Standing balance support: Bilateral upper extremity supported, During functional activity, Reliant on assistive device for balance Standing balance-Leahy Scale: Poor                             ADL either performed or assessed with clinical judgement   ADL Overall ADL's : Needs assistance/impaired                                       General ADL Comments: Pt significantly functionally limited by increased pain, decreased functional use of her LLE, and generalized weakness. She reqruires MAX A +2 for STS attempts and SPT to/from [facility]. She is able to perform peri-care with seated lateral leans and SET UP assist. Increased cueing required to maintain TTWB precautions during sessions.     Vision Baseline Vision/History: 1 Wears glasses Patient Visual Report: No change from baseline       Perception     Praxis      Pertinent Vitals/Pain Pain Assessment Pain Assessment: Faces Faces Pain Scale: Hurts whole lot Pain Location: LLE with movement Pain Descriptors / Indicators: Discomfort, Guarding, Grimacing, Sharp Pain Intervention(s): Limited activity within patient's tolerance, Monitored during session, Patient requesting pain meds-RN notified, RN gave pain meds during session     Hand Dominance Right   Extremity/Trunk Assessment Upper Extremity Assessment Upper Extremity Assessment: Generalized weakness   Lower Extremity Assessment Lower Extremity Assessment: Generalized weakness;Defer to PT evaluation (LLE KI, TTWB)   Cervical / Trunk Assessment Cervical / Trunk Assessment: Normal   Communication Communication Communication: No difficulties   Cognition Arousal/Alertness: Awake/alert Behavior During Therapy: WFL for tasks assessed/performed Overall Cognitive Status: Within Functional Limits for tasks assessed                                       General Comments        Exercises Other Exercises Other Exercises: Pt/daughter, educated on role of OT in acute setting, bed mobility techniques, safe transfer techniques, UB HEP and safe use of AE/DME for LB ADL management. OT facilitated toilet transfer/toileting with +2 assist from RN this date.   Shoulder Instructions      Home Living Family/patient expects to be discharged to:: Private residence Living Arrangements: Alone Available Help at Discharge: Family Type of Home: House Home Access: Stairs to enter Secretary/administrator of Steps: 4 Entrance Stairs-Rails: None Home Layout: One level               Home Equipment: None          Prior Functioning/Environment Prior Level of Function : Independent/Modified Independent;Driving                        OT Problem List: Decreased strength;Decreased coordination;Decreased range of motion;Decreased activity tolerance;Decreased safety awareness;Pain;Impaired balance (sitting and/or standing);Decreased knowledge of use of DME or AE;Decreased knowledge of precautions      OT Treatment/Interventions: Self-care/ADL training;Therapeutic exercise;Therapeutic activities;DME and/or AE instruction;Patient/family education;Energy conservation    OT Goals(Current goals can be found in the care plan section) Acute Rehab OT Goals Patient Stated Goal: To have  less pain OT Goal Formulation: With patient Time For Goal Achievement: 06/19/22 Potential to Achieve Goals: Good ADL Goals Pt Will Perform Grooming: sitting;with set-up;with supervision Pt Will Perform Lower Body Dressing: sit to/from stand;with min assist;with adaptive equipment Pt Will Transfer to Toilet: bedside commode;with min assist;ambulating;stand pivot transfer Pt Will Perform Toileting - Clothing Manipulation and hygiene: sit to/from stand;with adaptive equipment;with min assist  OT Frequency: Min 2X/week    Co-evaluation              AM-PAC OT "6 Clicks" Daily  Activity     Outcome Measure Help from another person eating meals?: A Little Help from another person taking care of personal grooming?: A Little Help from another person toileting, which includes using toliet, bedpan, or urinal?: A Lot Help from another person bathing (including washing, rinsing, drying)?: A Lot Help from another person to put on and taking off regular upper body clothing?: A Lot Help from another person to put on and taking off regular lower body clothing?: A Lot 6 Click Score: 14   End of Session Equipment Utilized During Treatment: Gait belt;Rolling walker (2 wheels) Nurse Communication: Mobility status;Patient requests pain meds  Activity Tolerance: Patient tolerated treatment well Patient left: in bed;with call bell/phone within reach;with bed alarm set;with family/visitor present  OT Visit Diagnosis: Other abnormalities of gait and mobility (R26.89);Repeated falls (R29.6)                Time: 5462-7035 OT Time Calculation (min): 56 min Charges:  OT General Charges $OT Visit: 1 Visit OT Evaluation $OT Eval Moderate Complexity: 1 Mod OT Treatments $Self Care/Home Management : 38-52 mins  Rockney Ghee, M.S., OTR/L 06/05/22, 12:46 PM

## 2022-06-05 NOTE — TOC Progression Note (Signed)
Transition of Care [facility]) - Progression Note    Patient Details  Name: Tamara [name] MRN: [id number] Date of Birth: May 04, 1941  [facility]) CM/SW Contact  Marlowe Sax, RN Phone Number: 06/05/2022, 4:38 PM  Clinical Narrative:    Reviewed the bed  offers with the daughter and she requested that we keep looking for more offers, will look again in the morning  Expected Discharge Plan: Skilled Nursing Facility Barriers to Discharge: SNF Pending bed offer, Insurance Authorization  Expected Discharge Plan and Services Expected Discharge Plan: Skilled Nursing Facility       Living arrangements for the past 2 months: Single Family Home                                       Social Determinants of Health (SDOH) Interventions    Readmission Risk Interventions     No data to display
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
## 2022-06-06 LAB — GLUCOSE, CAPILLARY
Glucose-Capillary: 139 mg/dL — ABNORMAL HIGH (ref 70–99)
Glucose-Capillary: 167 mg/dL — ABNORMAL HIGH (ref 70–99)
Glucose-Capillary: 148 mg/dL — ABNORMAL HIGH (ref 70–99)
Glucose-Capillary: 135 mg/dL — ABNORMAL HIGH (ref 70–99)
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
## 2022-06-06 NOTE — Plan of Care (Signed)
  Problem: Education: Goal: Knowledge of General Education information will improve Description: Including pain rating scale, medication(s)/side effects and non-pharmacologic comfort measures Outcome: Progressing   Problem: Clinical Measurements: Goal: Ability to maintain clinical measurements within normal limits will improve Outcome: Progressing   Problem: Activity: Goal: Risk for activity intolerance will decrease Outcome: Progressing   Problem: Safety: Goal: Ability to remain free from injury will improve Outcome: Progressing   Problem: Pain Managment: Goal: General experience of comfort will improve Outcome: Progressing

## 2022-06-06 NOTE — TOC Progression Note (Addendum)
Transition of Care [facility]) - Progression Note    Patient Details  Name: Tamara [name] MRN: [id number] Date of Birth: [birth year]  [facility] of [facility] [place] CM/SW Contact  Marlowe Sax, RN Phone Number: 06/06/2022, 3:33 PM  Clinical Narrative:     Spoke with the daughter and she has toured the facilities, she made a choice of [facility], , Ins pending to go to [facility] ref number [id number]  Expected Discharge Plan: Skilled Nursing Facility Barriers to Discharge: SNF Pending bed offer, Insurance Authorization  Expected Discharge Plan and Services Expected Discharge Plan: Skilled Nursing Facility       Living arrangements for the past 2 months: Single Family Home                                       Social Determinants of Health (SDOH) Interventions    Readmission Risk Interventions     No data to display

## 2022-06-06 NOTE — TOC Progression Note (Signed)
Transition of Care [facility]) - Progression Note    Patient Details  Name: Tamara [name] MRN: [id number] Date of Birth: 08/07/1940  Transition of Care [facility]) CM/SW [place], RN Phone Number: 06/06/2022, 10:44 AM  Clinical Narrative:    Met with the patient and her daughter in the room and reviewed the bed offers, she asked that I feax the referral to [facility] in [place] and she understands that she would need ot provide transportation, Overton Mam is the admissions coordinator at [employer] number is [phone]   Expected Discharge Plan: [place] Barriers to Discharge: SNF Pending bed offer, Insurance Authorization  Expected Discharge Plan and Services Expected Discharge Plan: [place] arrangements for the past 2 months: Single Family Home                                       Social Determinants of Health (SDOH) Interventions    Readmission Risk Interventions     No data to display

## 2022-06-06 NOTE — Progress Notes (Signed)
Occupational Therapy Treatment Patient Details Name: Tamara [name] MRN: [id number] DOB: 1940-11-19 Today's Date: 06/06/2022   History of present illness Pt is a 81 y/o F admitted on 06/03/22 after presenting following a fall. Imaging revealed comminuted fracture through the left lateral tibial plateau & nondisplaced fracture through the fibular head/neck. Orthopedics was consulted & pt felt to have non-operative fx with recommendation of TTWB with use of a knee immobilizer x 5-7 days for comfort, then okay to start gentle knee ROM. PMH: DM, dyslipidemia, HTN   OT comments  Tamara [name] was seen for OT tx this date. Upon arrival to room, pt seated in room recliner. Denies pain at rest. Pt agreeable to OT tx session and instruction initiated on UE Theraband (red) HEP as provided at previous session. Pt educated on safe use of Theraband and strategies to maximize safe positioning and BUE strengthening while performing seated exercises. Pt engages in education on 2/3 HEP exercises before answering personal phone call in room. Therapist educated pt that they would need to move on if pt desires to continue personal phone call during session. Pt elects to continue phone call so session concluded at this time. Pt progressing toward goals and continues to benefit from skilled OT services. Will continue to follow POC as written. DC recommendation remains appropriate.    Recommendations for follow up therapy are one component of a multi-disciplinary discharge planning process, led by the attending physician.  Recommendations may be updated based on patient status, additional functional criteria and insurance authorization.    Follow Up Recommendations  Skilled nursing-short term rehab (<3 hours/day)     Assistance Recommended at Discharge Frequent or constant Supervision/Assistance  Patient can return home with the following  Two people to help with walking and/or transfers;A lot of help with  bathing/dressing/bathroom;Help with stairs or ramp for entrance;Assist for transportation   Equipment Recommendations  BSC/3in1    Recommendations for Other Services      Precautions / Restrictions Precautions Precautions: Fall Required Braces or Orthoses: Knee Immobilizer - Left Restrictions Weight Bearing Restrictions: Yes LLE Weight Bearing: Touchdown weight bearing Other Position/Activity Restrictions: With L knee immobilizer on       Mobility Bed Mobility               General bed mobility comments: NT pt up in recliner at start/end of session.    Transfers                   General transfer comment: Deferred. Pt session interrupted by pt phone call.     Balance                                           ADL either performed or assessed with clinical judgement   ADL Overall ADL's : Needs assistance/impaired                                       General ADL Comments: Pt continues to be significantly functionally limited by increased pain, decreased functional use of her LLE, and generalized weakness.    Extremity/Trunk Assessment              Vision Baseline Vision/History: 1 Wears glasses Patient Visual Report: No change from baseline     Perception  Praxis      Cognition Arousal/Alertness: Awake/alert Behavior During Therapy: WFL for tasks assessed/performed Overall Cognitive Status: Within Functional Limits for tasks assessed                                          Exercises Other Exercises Other Exercises: Pt educated on UE HEP with red theraband as provided at previous session. Pt educated and return demos understanding of 2/3 exercises with mod cueing for technique.    Shoulder Instructions       General Comments      Pertinent Vitals/ Pain       Pain Assessment Pain Assessment: No/denies pain Pain Location: No pain at rest.  Home Living                                           Prior Functioning/Environment              Frequency  Min 2X/week        Progress Toward Goals  OT Goals(current goals can now be found in the care plan section)  Progress towards OT goals: Progressing toward goals  Acute Rehab OT Goals OT Goal Formulation: With patient Time For Goal Achievement: 06/19/22 Potential to Achieve Goals: Good  Plan Discharge plan remains appropriate;Frequency remains appropriate    Co-evaluation                 AM-PAC OT "6 Clicks" Daily Activity     Outcome Measure   Help from another person eating meals?: A Little Help from another person taking care of personal grooming?: A Little Help from another person toileting, which includes using toliet, bedpan, or urinal?: A Lot Help from another person bathing (including washing, rinsing, drying)?: A Lot Help from another person to put on and taking off regular upper body clothing?: A Lot Help from another person to put on and taking off regular lower body clothing?: A Lot 6 Click Score: 14    End of Session    OT Visit Diagnosis: Other abnormalities of gait and mobility (R26.89);Repeated falls (R29.6)   Activity Tolerance Patient tolerated treatment well   Patient Left in chair;with call bell/phone within reach;with chair alarm set   Nurse Communication          Time: 0347-4259 OT Time Calculation (min): 9 min  Charges: OT General Charges $OT Visit: 1 Visit OT Treatments $Therapeutic Exercise: 8-22 mins  Rockney Ghee, M.S., OTR/L 06/06/22, 3:15 PM

## 2022-06-06 NOTE — Progress Notes (Signed)
Physical Therapy Treatment Patient Details Name: Tamara [name] MRN: [id number] DOB: 23-Sep-1940 Today's Date: 06/06/2022   History of Present Illness Pt is a 81 y/o F admitted on 06/03/22 after presenting following a fall. Imaging revealed comminuted fracture through the left lateral tibial plateau & nondisplaced fracture through the fibular head/neck. Orthopedics was consulted & pt felt to have non-operative fx with recommendation of TTWB with use of a knee immobilizer x 5-7 days for comfort, then okay to start gentle knee ROM. PMH: DM, dyslipidemia, HTN    PT Comments    Pt received in bed, agreeable to skilled PT. Reviewed and completed supine B LE ROM prior to supine to sit with ModA. Pt able to tolerate ~20 degrees of L AA knee flexion through pain free range, improved from previous day. Sit to stand from raised bed with Min/ModA and cues to weight shift onto R LE. Good upright stance with support of RW while side shuffling to bedside chair. Pt feels Tramadol is helping to reduce pain and allow increased participation without side effects. Pt positioned to comfort in recliner, all needs in reach. Continue PT per POC until d/c to local SNF.  Recommendations for follow up therapy are one component of a multi-disciplinary discharge planning process, led by the attending physician.  Recommendations may be updated based on patient status, additional functional criteria and insurance authorization.  Follow Up Recommendations  Skilled nursing-short term rehab (<3 hours/day) Can patient physically be transported by private vehicle: No   Assistance Recommended at Discharge Frequent or constant Supervision/Assistance  Patient can return home with the following A lot of help with walking and/or transfers;A lot of help with bathing/dressing/bathroom;Assist for transportation;Assistance with cooking/housework;Help with stairs or ramp for entrance   Equipment Recommendations  Rolling walker (2  wheels);Wheelchair cushion (measurements PT);BSC/3in1;Wheelchair (measurements PT)    Recommendations for Other Services       Precautions / Restrictions Precautions Precautions: Fall Required Braces or Orthoses: Knee Immobilizer - Left Knee Immobilizer - Left:  (knee immobilizer 5-7 days from injury then only for comfort, ok to start gentle ROM) Restrictions Weight Bearing Restrictions: Yes LLE Weight Bearing: Touchdown weight bearing     Mobility  Bed Mobility Overal bed mobility: Needs Assistance Bed Mobility: Supine to Sit, Sit to Supine     Supine to sit: Mod assist, HOB elevated          Transfers Overall transfer level: Needs assistance Equipment used: Rolling walker (2 wheels) Transfers: Sit to/from Stand Sit to Stand: Min assist, Mod assist, From elevated surface Stand pivot transfers: Mod assist         General transfer comment: Cues for hand placement, weight shifting, and weight bearing limitations    Ambulation/Gait Ambulation/Gait assistance: Min assist Gait Distance (Feet): 2 Feet Assistive device: Rolling walker (2 wheels) Gait Pattern/deviations: Step-to pattern Gait velocity: decreased     General Gait Details: Pt able to maintain NWB L LE through pivoting on R foot with RW   Stairs             Wheelchair Mobility    Modified Rankin (Stroke Patients Only)       Balance Overall balance assessment: Needs assistance Sitting-balance support: Feet supported, Bilateral upper extremity supported Sitting balance-Leahy Scale: Fair Sitting balance - Comments: supervision static sitting   Standing balance support: Bilateral upper extremity supported, During functional activity, Reliant on assistive device for balance Standing balance-Leahy Scale: Poor  Cognition Arousal/Alertness: Awake/alert Behavior During Therapy: WFL for tasks assessed/performed Overall Cognitive Status: Within Functional  Limits for tasks assessed                                 General Comments: Pt very pleasant and motivated        Exercises General Exercises - Lower Extremity Ankle Circles/Pumps: AROM, Both, 10 reps Quad Sets: AROM, Both, 5 reps Gluteal Sets: AROM, Both, 5 reps Heel Slides: AROM, Right, 10 reps, AAROM, Left, 5 reps Hip ABduction/ADduction: AROM, Right, AAROM, Left, 5 reps    General Comments General comments (skin integrity, edema, etc.): Skin check, annd folded washcloth placed at L heel to prevent break down      Pertinent Vitals/Pain Pain Assessment Pain Assessment: 0-10 Pain Score: 4  Pain Location: LLE with movement Pain Descriptors / Indicators: Discomfort, Guarding, Grimacing Pain Intervention(s): Limited activity within patient's tolerance, Monitored during session    Home Living                          Prior Function            PT Goals (current goals can now be found in the care plan section) Acute Rehab PT Goals Patient Stated Goal: get better Progress towards PT goals: Progressing toward goals    Frequency    7X/week      PT Plan Current plan remains appropriate    Co-evaluation              AM-PAC PT "6 Clicks" Mobility   Outcome Measure  Help needed turning from your back to your side while in a flat bed without using bedrails?: A Lot Help needed moving from lying on your back to sitting on the side of a flat bed without using bedrails?: A Lot Help needed moving to and from a bed to a chair (including a wheelchair)?: A Little Help needed standing up from a chair using your arms (e.g., wheelchair or bedside chair)?: A Little Help needed to walk in hospital room?: A Lot Help needed climbing 3-5 steps with a railing? : Total 6 Click Score: 13    End of Session Equipment Utilized During Treatment: Left knee immobilizer;Gait belt Activity Tolerance: Patient tolerated treatment well Patient left: in chair;with  chair alarm set;with call bell/phone within reach;with family/visitor present Nurse Communication: Mobility status PT Visit Diagnosis: Other abnormalities of gait and mobility (R26.89);Unsteadiness on feet (R26.81);Difficulty in walking, not elsewhere classified (R26.2);Muscle weakness (generalized) (M62.81);Pain Pain - Right/Left: Left Pain - part of body: Knee     Time: 7414-2395 PT Time Calculation (min) (ACUTE ONLY): 45 min  Charges:  $Gait Training: 8-22 mins $Therapeutic Exercise: 8-22 mins $Therapeutic Activity: 8-22 mins                    Tamara [name], PTA   Tamara [name] 06/06/2022, 11:48 AM

## 2022-06-07 LAB — GLUCOSE, CAPILLARY
Glucose-Capillary: 139 mg/dL — ABNORMAL HIGH (ref 70–99)
Glucose-Capillary: 142 mg/dL — ABNORMAL HIGH (ref 70–99)
Glucose-Capillary: 148 mg/dL — ABNORMAL HIGH (ref 70–99)
Glucose-Capillary: 161 mg/dL — ABNORMAL HIGH (ref 70–99)
Glucose-Capillary: 220 mg/dL — ABNORMAL HIGH (ref 70–99)
Glucose-Capillary: 226 mg/dL — ABNORMAL HIGH (ref 70–99)

## 2022-06-07 MED ORDER — METFORMIN HCL 500 MG PO TABS
500.0000 mg | ORAL_TABLET | Freq: Every day | ORAL | Status: DC
  Administered 2022-06-08: 500 mg via ORAL
  Filled 2022-06-07: qty 1
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
## 2022-06-07 NOTE — Progress Notes (Signed)
0829 Pt ambulated up to chair for breakfast. Call bell and possessions within reach. Chair alarm on.

## 2022-06-07 NOTE — Progress Notes (Signed)
Physical Therapy Treatment Patient Details Name: Tamara [name] MRN: [id number] DOB: 1940/12/11 Today's Date: 06/07/2022   History of Present Illness Pt is a 81 y/o F admitted on 06/03/22 after presenting following a fall. Imaging revealed comminuted fracture through the left lateral tibial plateau & nondisplaced fracture through the fibular head/neck. Orthopedics was consulted & pt felt to have non-operative fx with recommendation of TTWB with use of a knee immobilizer x 5-7 days for comfort, then okay to start gentle knee ROM. PMH: DM, dyslipidemia, HTN    PT Comments    Pt was sitting in recliner upon arriving. She is A and O x 3 with supportive daughter and grandson at bedside. Pt/family are planning to DC from acute hospital, go to rehab, then DC from STR to daughters house in [place]. Pt was able to stand but did better from elevated surface heights. Continues to struggle with gait due to her limited wt bearing status and lack of coordination/ strength in UEs. Unable to progress to ambulation while maintaining TDWB. Pt was able to fully participate in there ex with assistance. STR at DC remains most appropriate to maximize independence prior to Dcing home to daughter's. Acute PT will continue to follow per current POC.     Recommendations for follow up therapy are one component of a multi-disciplinary discharge planning process, led by the attending physician.  Recommendations may be updated based on patient status, additional functional criteria and insurance authorization.  Follow Up Recommendations  Skilled nursing-short term rehab (<3 hours/day)     Assistance Recommended at Discharge Frequent or constant Supervision/Assistance  Patient can return home with the following A lot of help with walking and/or transfers;A lot of help with bathing/dressing/bathroom;Assist for transportation;Assistance with cooking/housework;Help with stairs or ramp for entrance   Equipment Recommendations   Rolling walker (2 wheels);Wheelchair cushion (measurements PT);BSC/3in1;Wheelchair (measurements PT)       Precautions / Restrictions Precautions Precautions: Fall Required Braces or Orthoses: Knee Immobilizer - Left Knee Immobilizer - Left: Other (comment) (5-7 days then ok for ROM) Restrictions Weight Bearing Restrictions: Yes LLE Weight Bearing: Touchdown weight bearing     Mobility  Bed Mobility  General bed mobility comments: pt was in recliner pre/post session    Transfers Overall transfer level: Needs assistance Equipment used: Rolling walker (2 wheels) Transfers: Sit to/from Stand Sit to Stand: Min assist, Mod assist  General transfer comment: min assist from elevated surface heights but mod assist from lower surfaces    Ambulation/Gait  General Gait Details: pt is able to pivot however unable to advance to gait due to inabiulity to maintain NWB while advancing RLE. Max vcs for improved technique/UE wt bearing however pt is unable to safely progress.    Balance Overall balance assessment: Needs assistance Sitting-balance support: Feet supported, Bilateral upper extremity supported Sitting balance-Leahy Scale: Fair     Standing balance support: Bilateral upper extremity supported, During functional activity, Reliant on assistive device for balance Standing balance-Leahy Scale: Poor Standing balance comment: high fall risk due to wt bearing and inability to advacne to "hop to/TTWB gait)       Cognition Arousal/Alertness: Awake/alert Behavior During Therapy: WFL for tasks assessed/performed Overall Cognitive Status: Within Functional Limits for tasks assessed      General Comments: Pt is pleasant and cooperative but continues to be limited by pain and inability to maintain TDWB in standing(dynamic task)           General Comments General comments (skin integrity, edema, etc.): Reviewed and  perform HEP with assistance. Pain in LLE with all movements       Pertinent Vitals/Pain Pain Assessment Pain Assessment: 0-10 Pain Score: 8  Faces Pain Scale: Hurts whole lot Pain Location: No pain at rest. Pain Descriptors / Indicators: Discomfort, Guarding, Grimacing Pain Intervention(s): Limited activity within patient's tolerance, Monitored during session, Premedicated before session, Repositioned     PT Goals (current goals can now be found in the care plan section) Acute Rehab PT Goals Patient Stated Goal: get better Progress towards PT goals: Progressing toward goals    Frequency    7X/week      PT Plan Current plan remains appropriate       AM-PAC PT "6 Clicks" Mobility   Outcome Measure  Help needed turning from your back to your side while in a flat bed without using bedrails?: A Lot Help needed moving from lying on your back to sitting on the side of a flat bed without using bedrails?: A Lot Help needed moving to and from a bed to a chair (including a wheelchair)?: A Little Help needed standing up from a chair using your arms (e.g., wheelchair or bedside chair)?: A Little Help needed to walk in hospital room?: A Lot Help needed climbing 3-5 steps with a railing? : Total 6 Click Score: 13    End of Session Equipment Utilized During Treatment: Left knee immobilizer;Gait belt Activity Tolerance: Patient tolerated treatment well Patient left: in chair;with chair alarm set;with call bell/phone within reach;with family/visitor present Nurse Communication: Mobility status PT Visit Diagnosis: Other abnormalities of gait and mobility (R26.89);Unsteadiness on feet (R26.81);Difficulty in walking, not elsewhere classified (R26.2);Muscle weakness (generalized) (M62.81);Pain Pain - Right/Left: Left Pain - part of body: Knee     Time: 6979-4801 PT Time Calculation (min) (ACUTE ONLY): 17 min  Charges:  $Therapeutic Exercise: 8-22 mins                    Jetta Lout PTA 06/07/22, 12:50 PM

## 2022-06-07 NOTE — TOC Progression Note (Signed)
Transition of Care [facility]) - Progression Note    Patient Details  Name: Tamara [name] MRN: [id number] Date of Birth: 1940/09/28  [facility] of [facility]) CM/SW Contact  Marlowe Sax, RN Phone Number: 06/07/2022, 12:49 PM  Clinical Narrative:    Ins auth still pending   Expected Discharge Plan: Skilled Nursing Facility Barriers to Discharge: SNF Pending bed offer, Insurance Authorization  Expected Discharge Plan and Services Expected Discharge Plan: Skilled Nursing Facility       Living arrangements for the past 2 months: Single Family Home                                       Social Determinants of Health (SDOH) Interventions    Readmission Risk Interventions     No data to display

## 2022-06-07 NOTE — TOC Progression Note (Signed)
Transition of Care [facility]) - Progression Note    Patient Details  Name: Tamara [name] MRN: [id number] Date of Birth: 1940-12-12  Transition of Care [facility]) CM/SW Contact  Marlowe Sax, RN Phone Number: 06/07/2022, 3:14 PM  Clinical Narrative:     Checked Ins Berkley Harvey Still pending  Expected Discharge Plan: Skilled Nursing Facility Barriers to Discharge: SNF Pending bed offer, Insurance Authorization  Expected Discharge Plan and Services Expected Discharge Plan: Skilled Nursing Facility       Living arrangements for the past 2 months: Single Family Home                                       Social Determinants of Health (SDOH) Interventions    Readmission Risk Interventions     No data to display

## 2022-06-07 NOTE — Plan of Care (Signed)
  Problem: Education: Goal: Knowledge of General Education information will improve Description: Including pain rating scale, medication(s)/side effects and non-pharmacologic comfort measures Outcome: Progressing   Problem: Health Behavior/Discharge Planning: Goal: Ability to manage health-related needs will improve Outcome: Progressing   Problem: Clinical Measurements: Goal: Ability to maintain clinical measurements within normal limits will improve Outcome: Progressing   Problem: Coping: Goal: Level of anxiety will decrease Outcome: Progressing   Problem: Nutrition: Goal: Adequate nutrition will be maintained Outcome: Progressing   Problem: Skin Integrity: Goal: Risk for impaired skin integrity will decrease Outcome: Progressing   Problem: Coping: Goal: Ability to adjust to condition or change in health will improve Outcome: Progressing   Problem: Skin Integrity: Goal: Risk for impaired skin integrity will decrease Outcome: Progressing   Problem: Metabolic: Goal: Ability to maintain appropriate glucose levels will improve Outcome: Progressing   Problem: Tissue Perfusion: Goal: Adequacy of tissue perfusion will improve Outcome: Progressing

## 2022-06-07 NOTE — Progress Notes (Signed)
Occupational Therapy Treatment Patient Details Name: Tamara [name] MRN: [id number] DOB: Mar 30, 1941 Today's Date: 06/07/2022   History of present illness Pt is a 81 y/o F admitted on 06/03/22 after presenting following a fall. Imaging revealed comminuted fracture through the left lateral tibial plateau & nondisplaced fracture through the fibular head/neck. Orthopedics was consulted & pt felt to have non-operative fx with recommendation of TTWB with use of a knee immobilizer x 5-7 days for comfort, then okay to start gentle knee ROM. PMH: DM, dyslipidemia, HTN   OT comments  Chart reviewed, pt greeted in chair agreeable to abbreviated OT tx session. Tx session targeted improving BUE strength for improved indep during toilet transfers via task oriented training and HEP. Progress is being made towards goals set fourth, discharge recommendation remains appropriate. OT will continue to follow acutely.    Recommendations for follow up therapy are one component of a multi-disciplinary discharge planning process, led by the attending physician.  Recommendations may be updated based on patient status, additional functional criteria and insurance authorization.    Follow Up Recommendations  Skilled nursing-short term rehab (<3 hours/day)     Assistance Recommended at Discharge Frequent or constant Supervision/Assistance  Patient can return home with the following  Two people to help with walking and/or transfers;A lot of help with bathing/dressing/bathroom;Help with stairs or ramp for entrance;Assist for transportation   Equipment Recommendations  BSC/3in1    Recommendations for Other Services      Precautions / Restrictions Precautions Precautions: Fall Required Braces or Orthoses: Knee Immobilizer - Left Knee Immobilizer - Left: Other (comment) (5-7 days then ok for ROM) Restrictions Weight Bearing Restrictions: Yes LLE Weight Bearing: Touchdown weight bearing Other Position/Activity  Restrictions: With L knee immobilizer on       Mobility Bed Mobility               General bed mobility comments: NT in recliner pre/post session    Transfers Overall transfer level: Needs assistance Equipment used: Rolling walker (2 wheels) Transfers: Sit to/from Stand Sit to Stand: Min guard, Min assist (CGA-MIN A 5x with RW, frequent vcs)                 Balance Overall balance assessment: Needs assistance Sitting-balance support: Feet supported, Bilateral upper extremity supported Sitting balance-Leahy Scale: Fair     Standing balance support: Bilateral upper extremity supported, During functional activity, Reliant on assistive device for balance Standing balance-Leahy Scale: Poor                             ADL either performed or assessed with clinical judgement   ADL Overall ADL's : Needs assistance/impaired                                       General ADL Comments: SET UP grooming tasks    Extremity/Trunk Assessment              Vision       Perception     Praxis      Cognition Arousal/Alertness: Awake/alert Behavior During Therapy: WFL for tasks assessed/performed Overall Cognitive Status: Within Functional Limits for tasks assessed  Exercises Other Exercises Other Exercises: Educated re: red theraband HEP, performs all exercises with supervision, intermittent vcs to facilitate improved toilet/shower transfers    Shoulder Instructions       General Comments Reviewed and perform HEP with assistance. Pain in LLE with all movements    Pertinent Vitals/ Pain       Pain Assessment Pain Assessment: Faces Faces Pain Scale: Hurts even more Pain Location: with mobility, no pain at rest Pain Descriptors / Indicators: Discomfort, Guarding, Grimacing Pain Intervention(s): Limited activity within patient's tolerance, Monitored during session,  Repositioned  Home Living                                          Prior Functioning/Environment              Frequency  Min 2X/week        Progress Toward Goals  OT Goals(current goals can now be found in the care plan section)  Progress towards OT goals: Progressing toward goals     Plan Discharge plan remains appropriate;Frequency remains appropriate    Co-evaluation                 AM-PAC OT "6 Clicks" Daily Activity     Outcome Measure   Help from another person eating meals?: None Help from another person taking care of personal grooming?: A Little Help from another person toileting, which includes using toliet, bedpan, or urinal?: A Lot Help from another person bathing (including washing, rinsing, drying)?: A Lot Help from another person to put on and taking off regular upper body clothing?: A Little Help from another person to put on and taking off regular lower body clothing?: A Lot 6 Click Score: 16    End of Session Equipment Utilized During Treatment: Rolling walker (2 wheels)  OT Visit Diagnosis: Other abnormalities of gait and mobility (R26.89);Repeated falls (R29.6)   Activity Tolerance Patient tolerated treatment well   Patient Left in chair;with call bell/phone within reach   Nurse Communication Mobility status;Patient requests pain meds        Time: 1429-1441 OT Time Calculation (min): 12 min  Charges: OT General Charges $OT Visit: 1 Visit OT Treatments $Therapeutic Activity: 8-22 mins  Oleta Mouse, OTD OTR/L  06/07/22, 3:26 PM

## 2022-06-07 NOTE — Plan of Care (Signed)

## 2022-06-07 NOTE — Care Management Important Message (Signed)
Important Message  Patient Details  Name: Tamara [name] MRN: [id number] Date of Birth: February 27, 1941   Medicare Important Message Given:  Yes     Johnell Comings 06/07/2022, 12:31 PM

## 2022-06-08 LAB — CBC
HCT: 35.9 % — ABNORMAL LOW (ref 36.0–46.0)
Hemoglobin: 12.3 g/dL (ref 12.0–15.0)
MCH: 29.5 pg (ref 26.0–34.0)
MCHC: 34.3 g/dL (ref 30.0–36.0)
MCV: 86.1 fL (ref 80.0–100.0)
Platelets: 299 10*3/uL (ref 150–400)
RBC: 4.17 MIL/uL (ref 3.87–5.11)
RDW: 12.9 % (ref 11.5–15.5)
WBC: 7.4 10*3/uL (ref 4.0–10.5)
nRBC: 0 % (ref 0.0–0.2)

## 2022-06-08 LAB — BASIC METABOLIC PANEL
Anion gap: 9 (ref 5–15)
BUN: 23 mg/dL (ref 8–23)
CO2: 24 mmol/L (ref 22–32)
Calcium: 9.3 mg/dL (ref 8.9–10.3)
Chloride: 101 mmol/L (ref 98–111)
Creatinine, Ser: 1.04 mg/dL — ABNORMAL HIGH (ref 0.44–1.00)
GFR, Estimated: 55 mL/min — ABNORMAL LOW (ref >60)
Glucose, Bld: 175 mg/dL — ABNORMAL HIGH (ref 70–99)
Potassium: 3.6 mmol/L (ref 3.5–5.1)
Sodium: 134 mmol/L — ABNORMAL LOW (ref 135–145)

## 2022-06-08 LAB — GLUCOSE, CAPILLARY
Glucose-Capillary: 176 mg/dL — ABNORMAL HIGH (ref 70–99)
Glucose-Capillary: 233 mg/dL — ABNORMAL HIGH (ref 70–99)

## 2022-06-08 LAB — MAGNESIUM: Magnesium: 2.3 mg/dL (ref 1.7–2.4)

## 2022-06-08 MED ORDER — MULTI-VITAMIN/MINERALS PO TABS: 1.0000 | ORAL_TABLET | Freq: Every day | ORAL | 2 refills | Status: AC

## 2022-06-08 MED ORDER — TRAMADOL HCL 50 MG PO TABS: 50.0000 mg | ORAL_TABLET | Freq: Four times a day (QID) | ORAL | 0 refills | Status: AC | PRN

## 2022-06-08 MED ORDER — ZINC 20 MG PO CAPS: 1.0000 | ORAL_CAPSULE | Freq: Every day | ORAL | Status: DC

## 2022-06-08 NOTE — Progress Notes (Signed)
Patient d/c to [facility], report called to rachel. EMS called.

## 2022-06-08 NOTE — TOC Progression Note (Signed)
Transition of Care [facility]) - Progression Note    Patient Details  Name: Tamara [name] MRN: [id number] Date of Birth: [birth year]  Transition of Care [facility]) CM/SW Contact  Marlowe Sax, RN Phone Number: 06/08/2022, 11:15 AM  Clinical Narrative:    Patient going to room 709 at [facility], daughter is in the room and aware, EMS called, they report it might be an extended ETA Notified the patient and daughter   Expected Discharge Plan: Skilled Nursing Facility Barriers to Discharge: SNF Pending bed offer, Insurance Authorization  Expected Discharge Plan and Services Expected Discharge Plan: Skilled Nursing Facility       Living arrangements for the past 2 months: Single Family Home Expected Discharge Date: 06/08/22                                     Social Determinants of Health (SDOH) Interventions    Readmission Risk Interventions     No data to display

## 2022-06-08 NOTE — TOC Progression Note (Signed)
Transition of Care [facility]) - Progression Note    Patient Details  Name: Tamara [name] MRN: [id number] Date of Birth: [birth year]  Transition of Care [facility]) CM/SW Contact  Marlowe Sax, RN Phone Number: 06/08/2022, 10:13 AM  Clinical Narrative:   Ins is approved to go to [facility] Ref number [id number]     Expected Discharge Plan: Skilled Nursing Facility Barriers to Discharge: SNF Pending bed offer, Insurance Authorization  Expected Discharge Plan and Services Expected Discharge Plan: Skilled Nursing Facility       Living arrangements for the past 2 months: Single Family Home                                       Social Determinants of Health (SDOH) Interventions    Readmission Risk Interventions     No data to display

## 2022-06-08 NOTE — TOC Progression Note (Signed)
Transition of Care [facility]) - Progression Note    Patient Details  Name: Tamara [name] MRN: [id number] Date of Birth: [birth year]  Transition of Care [facility]) CM/SW Contact  Marlowe Sax, RN Phone Number: 06/08/2022, 10:04 AM  Clinical Narrative:    Called and spoke with Clydie Braun the daughter and let her know that we have [facility] approval and that she will dc to [facility] today, I explained that once at [facility] the SW will work on next steps for DC from there and whatever the patient's needs will be, I explained how EMS transport works to go to the facility I will call her back with a room number once obtained   Expected Discharge Plan: Skilled Nursing Facility Barriers to Discharge: SNF Pending bed offer, Insurance Authorization  Expected Discharge Plan and Services Expected Discharge Plan: Skilled Nursing Facility       Living arrangements for the past 2 months: Single Family Home                                       Social Determinants of Health (SDOH) Interventions    Readmission Risk Interventions     No data to display

## 2022-06-08 NOTE — Progress Notes (Signed)
Patient resting bed. Patient requesting an ice pack for her leg. Daughter at bedside requesting to speak to [facility] manager. Dr Lai/Dr Okey Dupre notified of ice pack request, orders placed for [facility]. Deliliah CSW notified of request to speak to manager. No other needs at this time.

## 2022-06-08 NOTE — Progress Notes (Signed)
[facility] at bedside.

## 2022-06-08 NOTE — Plan of Care (Signed)

## 2022-06-08 NOTE — TOC Progression Note (Signed)
Transition of Care [facility]) - Progression Note    Patient Details  Name: Tamara [name] MRN: [id number] Date of Birth: [birth year]  Transition of Care [facility]) CM/SW Contact  Darleene Cleaver, [place] Phone Number: 06/08/2022, 2:59 PM  Clinical Narrative:     This Clinical research associate received a message that the patient's daughter wanted to speak to [facility] supervisor.  This writer attempted to call her and left a voice mail awaiting a call back.  Expected Discharge Plan: Skilled Nursing Facility Barriers to Discharge: SNF Pending bed offer, Insurance Authorization  Expected Discharge Plan and Services Expected Discharge Plan: Skilled Nursing Facility       Living arrangements for the past 2 months: Single Family Home Expected Discharge Date: 06/08/22                                     Social Determinants of Health (SDOH) Interventions    Readmission Risk Interventions     No data to display
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
## 2022-06-16 ENCOUNTER — Ambulatory Visit: Payer: Medicare Other | Admitting: Podiatry

## 2022-07-24 ENCOUNTER — Ambulatory Visit: Payer: Self-pay | Admitting: Urology

## 2022-08-11 ENCOUNTER — Other Ambulatory Visit: Payer: Self-pay | Admitting: Internal Medicine

## 2022-08-11 DIAGNOSIS — E785 Hyperlipidemia, unspecified: Secondary | ICD-10-CM

## 2022-08-11 DIAGNOSIS — N1831 Chronic kidney disease, stage 3a: Secondary | ICD-10-CM

## 2022-08-11 DIAGNOSIS — I10 Essential (primary) hypertension: Secondary | ICD-10-CM

## 2022-08-21 ENCOUNTER — Other Ambulatory Visit: Payer: Self-pay | Admitting: Internal Medicine

## 2022-09-06 ENCOUNTER — Telehealth: Payer: Self-pay

## 2022-09-06 NOTE — Telephone Encounter (Signed)
Patient called stating that she would like to get that [place] that you mentioned she needed but she would like to get in done in [facility] is there anyway we can send an order to some place down there for that?

## 2022-09-08 ENCOUNTER — Other Ambulatory Visit: Payer: Self-pay | Admitting: Internal Medicine

## 2022-09-08 DIAGNOSIS — R600 Localized edema: Secondary | ICD-10-CM

## 2022-09-26 ENCOUNTER — Other Ambulatory Visit: Payer: Self-pay | Admitting: Internal Medicine

## 2022-09-26 DIAGNOSIS — Z1231 Encounter for screening mammogram for malignant neoplasm of breast: Secondary | ICD-10-CM

## 2022-10-06 ENCOUNTER — Ambulatory Visit (INDEPENDENT_AMBULATORY_CARE_PROVIDER_SITE_OTHER): Payer: Medicare Other | Admitting: Podiatry

## 2022-10-06 DIAGNOSIS — B351 Tinea unguium: Secondary | ICD-10-CM

## 2022-10-06 DIAGNOSIS — M79674 Pain in right toe(s): Secondary | ICD-10-CM | POA: Diagnosis not present

## 2022-10-06 DIAGNOSIS — M79675 Pain in left toe(s): Secondary | ICD-10-CM | POA: Diagnosis not present

## 2022-10-16 NOTE — Progress Notes (Signed)
   SUBJECTIVE Patient PMHx diabetes mellitus presents to office today complaining of elongated, thickened nails that cause pain while ambulating in shoes.  Patient is unable to trim their own nails. Patient is here for further evaluation and treatment.  Past Medical History:  Diagnosis Date   Diabetes mellitus without complication (HCC)    Hypercholesteremia    Hyperlipidemia    Hypertension     OBJECTIVE General Patient is awake, alert, and oriented x 3 and in no acute distress. Derm Skin is dry and supple bilateral. Negative open lesions or macerations. Remaining integument unremarkable. Nails are tender, long, thickened and dystrophic with subungual debris, consistent with onychomycosis, 1-5 bilateral. No signs of infection noted. Vasc  DP and PT pedal pulses palpable bilaterally. Temperature gradient within normal limits.  Neuro Epicritic and protective threshold sensation grossly intact bilaterally.  Musculoskeletal Exam No symptomatic pedal deformities noted bilateral. Muscular strength within normal limits.  ASSESSMENT 1.  Pain due to onychomycosis of toenails both  PLAN OF CARE 1. Patient evaluated today.  2. Instructed to maintain good pedal hygiene and foot care.  3. Mechanical debridement of nails 1-5 bilaterally performed using a nail nipper. Filed with dremel without incident.  4. Return to clinic in 3 mos.    Ananya Mccleese M. Aleina Burgio, DPM [facility]  Dr. Devaughn Savant M. Shyne Lehrke, DPM    [address]                                     [place], [place] [address]                Office [phone]  Fax [phone]     

## 2022-10-23 ENCOUNTER — Ambulatory Visit (INDEPENDENT_AMBULATORY_CARE_PROVIDER_SITE_OTHER): Payer: Medicare Other | Admitting: Urology

## 2022-10-23 ENCOUNTER — Encounter: Payer: Self-pay | Admitting: Urology

## 2022-10-23 VITALS — BP 156/83 | HR 74 | Ht 65.0 in | Wt 159.0 lb

## 2022-10-23 DIAGNOSIS — R3129 Other microscopic hematuria: Secondary | ICD-10-CM | POA: Diagnosis not present

## 2022-10-23 DIAGNOSIS — N3020 Other chronic cystitis without hematuria: Secondary | ICD-10-CM | POA: Diagnosis not present

## 2022-10-23 LAB — MICROSCOPIC EXAMINATION

## 2022-10-23 LAB — URINALYSIS, COMPLETE
Bilirubin, UA: NEGATIVE
Glucose, UA: NEGATIVE
Ketones, UA: NEGATIVE
Nitrite, UA: NEGATIVE
Protein,UA: NEGATIVE
Specific Gravity, UA: 1.02 (ref 1.005–1.030)
Urobilinogen, Ur: 1 mg/dL (ref 0.2–1.0)
pH, UA: 6.5 (ref 5.0–7.5)

## 2022-10-23 MED ORDER — NITROFURANTOIN MONOHYD MACRO 100 MG PO CAPS: 100.0000 mg | ORAL_CAPSULE | Freq: Every day | ORAL | 3 refills | Status: DC

## 2022-10-23 NOTE — Progress Notes (Signed)
10/23/2022 10:08 AM   Tamara [name] July 26, 1940 [id number]  Referring provider: [name], MD [address] [place],  [place] [address]  Chief Complaint  Patient presents with   Follow-up    HPI: The patient was discharged in July 2018 and had had acute encephalopathy with urinary tract infection and sepsis. Culture was positive. Her renal ultrasound in July was normal   I follow the patient in [place] and I have had her on daily trimethoprim for 2 or 3 years. She still gets approximate 2 infections a year and her symptoms were a little bit nonspecific. Her daughter from [place] was here and there is no question that about every 2 year she can end up in the hospital with CNS symptoms and she often gets foul smelling urine. She believes her urine is a bit foul-smelling in the morning now.   Patient had been on daily Macrodantin and then saw a nurse practitioner February 17, 2019.  She had been placed on Augmentin by primary care for a lesion in the pelvic inguinal crease on the left side.  She was on the Macrodantin.  Apparently was also having pain around the rectum.  Patient did have a positive urine culture and microscopic hematuria that day   I found the patient's history little bit challenging and she was not clear if she was on prophylaxis but she does take a probiotic.   The patient was cleared with a normal urinalysis.  She did have a positive culture on the day.  She is on daily Macrodantin.  Prescription renewed and I will see in 1 year   TOday No infections on daily Macrobid.  90 x 3 sent to pharmacy.  Frequency stable.  Now living with daughter.  Clinically not infected Today she had red blood cells in the urine.  There was few bacteria.  Workup described. Takes daily aspirin but no blood thinners.  No smoking history   PMH: Past Medical History:  Diagnosis Date   Diabetes mellitus without complication (HCC)    Hypercholesteremia    Hyperlipidemia     Hypertension     Surgical History: Past Surgical History:  Procedure Laterality Date   APPENDECTOMY     COLONOSCOPY WITH PROPOFOL N/A 08/09/2018   Procedure: COLONOSCOPY WITH PROPOFOL;  Surgeon: Christena Deem, MD;  Location: [facility] ENDOSCOPY;  Service: Endoscopy;  Laterality: N/A;   EYE SURGERY      Home Medications:  Allergies as of 10/23/2022       Reactions   Cefoxitin Diarrhea        Medication List        Accurate as of October 23, 2022 10:08 AM. If you have any questions, ask your nurse or doctor.          amLODipine-olmesartan 10-40 MG tablet Commonly known as: AZOR TAKE 1 TABLET DAILY   aspirin 81 MG tablet Take 81 mg by mouth daily.   atenolol-chlorthalidone 100-25 MG tablet Commonly known as: TENORETIC TAKE 1 TABLET DAILY   atorvastatin 20 MG tablet Commonly known as: LIPITOR TAKE 1 TABLET EVERY EVENING   bifidobacterium infantis capsule Take 1 capsule by mouth daily.   Cholecalciferol 25 MCG (1000 UT) tablet Take 1,000 Units by mouth daily.   metFORMIN 500 MG tablet Commonly known as: GLUCOPHAGE TAKE 1 TABLET IN THE MORNING   multivitamin with minerals tablet Take 1 tablet by mouth daily. Home med.   nitrofurantoin (macrocrystal-monohydrate) 100 MG capsule Commonly known as: MACROBID Take 1  capsule (100 mg total) by mouth daily.   Zinc 20 MG Caps Take 1 tablet by mouth daily. Home med.        Allergies:  Allergies  Allergen Reactions   Cefoxitin Diarrhea    Family History: Family History  Problem Relation Age of Onset   Diabetes type II Brother    Hypertension Brother    Bladder Cancer Neg Hx    Kidney cancer Neg Hx    Breast cancer Neg Hx     Social History:  reports that she has never smoked. She has never used smokeless tobacco. She reports that she does not drink alcohol and does not use drugs.  ROS:                                        Physical Exam: BP (!) 156/83   Pulse 74   Ht 5'  5" (1.651 m)   Wt 72.1 kg   BMI 26.46 kg/m   Constitutional:  Alert and oriented, No acute distress. HEENT: [place] AT, moist mucus membranes.  Trachea midline, no masses.  Laboratory Data: Lab Results  Component Value Date   WBC 7.4 06/08/2022   HGB 12.3 06/08/2022   HCT 35.9 (L) 06/08/2022   MCV 86.1 06/08/2022   PLT 299 06/08/2022    Lab Results  Component Value Date   CREATININE 1.04 (H) 06/08/2022    No results found for: "PSA"  No results found for: "TESTOSTERONE"  Lab Results  Component Value Date   HGBA1C 6.4 (H) 06/04/2022    Urinalysis    Component Value Date/Time   COLORURINE YELLOW (A) 01/09/2017 1725   APPEARANCEUR Clear 03/31/2019 1047   LABSPEC 1.010 06/23/2017 1907   LABSPEC 1.017 10/21/2011 1422   PHURINE 6.0 06/23/2017 1907   GLUCOSEU Negative 03/31/2019 1047   GLUCOSEU Negative 10/21/2011 1422   HGBUR TRACE (A) 06/23/2017 1907   BILIRUBINUR Negative 03/31/2019 1047   BILIRUBINUR Negative 10/21/2011 1422   KETONESUR NEGATIVE 06/23/2017 1907   PROTEINUR Negative 03/31/2019 1047   PROTEINUR NEGATIVE 06/23/2017 1907   UROBILINOGEN 0.2 06/23/2017 1907   NITRITE Negative 03/31/2019 1047   NITRITE NEGATIVE 06/23/2017 1907   LEUKOCYTESUR Negative 03/31/2019 1047   LEUKOCYTESUR 2+ 10/21/2011 1422    Pertinent Imaging:   Assessment & Plan: Patient has microscopic hematuria.  Because of her daughter's travel from just outside [place] in [place] we will try to do both test the same day.  There was nontender after urine sent for culture but clinically not infected.  1. Chronic cystitis  - Urinalysis, Complete   No follow-ups on file.  Martina Sinner, MD  [facility] [address], [address] [place], [place] [address] [phone]

## 2022-10-23 NOTE — Addendum Note (Signed)
Addended by: Sueanne Margarita on: 10/23/2022 10:43 AM   Modules accepted: Orders

## 2022-10-24 ENCOUNTER — Other Ambulatory Visit: Payer: Self-pay | Admitting: Internal Medicine

## 2022-11-28 ENCOUNTER — Encounter: Payer: Self-pay | Admitting: Internal Medicine

## 2022-11-28 ENCOUNTER — Ambulatory Visit (INDEPENDENT_AMBULATORY_CARE_PROVIDER_SITE_OTHER): Payer: Medicare Other | Admitting: Internal Medicine

## 2022-11-28 VITALS — BP 140/86 | HR 53 | Ht 65.0 in | Wt 160.6 lb

## 2022-11-28 DIAGNOSIS — Z794 Long term (current) use of insulin: Secondary | ICD-10-CM

## 2022-11-28 DIAGNOSIS — L0232 Furuncle of buttock: Secondary | ICD-10-CM

## 2022-11-28 DIAGNOSIS — I10 Essential (primary) hypertension: Secondary | ICD-10-CM

## 2022-11-28 DIAGNOSIS — E785 Hyperlipidemia, unspecified: Secondary | ICD-10-CM | POA: Diagnosis not present

## 2022-11-28 DIAGNOSIS — N1832 Chronic kidney disease, stage 3b: Secondary | ICD-10-CM

## 2022-11-28 DIAGNOSIS — E1122 Type 2 diabetes mellitus with diabetic chronic kidney disease: Secondary | ICD-10-CM

## 2022-11-28 MED ORDER — AMOXICILLIN-POT CLAVULANATE 500-125 MG PO TABS: 1.0000 | ORAL_TABLET | Freq: Two times a day (BID) | ORAL | 0 refills | Status: AC

## 2022-11-28 MED ORDER — AMOXICILLIN-POT CLAVULANATE 500-125 MG PO TABS: 1.0000 | ORAL_TABLET | Freq: Two times a day (BID) | ORAL | 0 refills | Status: DC
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
## 2022-11-28 NOTE — Progress Notes (Signed)
Established Patient Office Visit  Subjective:  Patient ID: Tamara [name], female    DOB: [birth year]  Age: 82 y.o. MRN: [id number]  Chief Complaint  Patient presents with   Follow-up    Follow up, discuss lab results.    HPI  No other concerns at this time.   Past Medical History:  Diagnosis Date   Diabetes mellitus without complication (HCC)    Hypercholesteremia    Hyperlipidemia    Hypertension     Past Surgical History:  Procedure Laterality Date   APPENDECTOMY     COLONOSCOPY WITH PROPOFOL N/A 08/09/2018   Procedure: COLONOSCOPY WITH PROPOFOL;  Surgeon: Christena Deem, MD;  Location: [facility] ENDOSCOPY;  Service: Endoscopy;  Laterality: N/A;   EYE SURGERY      Social History   Socioeconomic History   Marital status: Widowed    Spouse name: Not on file   Number of children: Not on file   Years of education: Not on file   Highest education level: Not on file  Occupational History   Not on file  Tobacco Use   Smoking status: Never    Passive exposure: Never   Smokeless tobacco: Never  Vaping Use   Vaping Use: Never used  Substance and Sexual Activity   Alcohol use: No   Drug use: Never   Sexual activity: Not Currently    Birth control/protection: Post-menopausal  Other Topics Concern   Not on file  Social History Narrative   Not on file   Social Determinants of Health   Financial Resource Strain: Not on file  Food Insecurity: No Food Insecurity (06/04/2022)   Hunger Vital Sign    Worried About Running Out of Food in the Last Year: Never true    Ran Out of Food in the Last Year: Never true  Transportation Needs: No Transportation Needs (06/04/2022)   PRAPARE - Administrator, Civil Service (Medical): No    Lack of Transportation (Non-Medical): No  Physical Activity: Not on file  Stress: Not on file  Social Connections: Not on file  Intimate Partner Violence: Not At Risk (06/04/2022)   Humiliation, Afraid, Rape, and Kick questionnaire     Fear of Current or Ex-Partner: No    Emotionally Abused: No    Physically Abused: No    Sexually Abused: No    Family History  Problem Relation Age of Onset   Diabetes type II Brother    Hypertension Brother    Bladder Cancer Neg Hx    Kidney cancer Neg Hx    Breast cancer Neg Hx     Allergies  Allergen Reactions   Cefoxitin Diarrhea    ROS     Objective:   BP (!) 140/86   Pulse (!) 53   Ht 5\' 5"  (1.651 m)   Wt 160 lb 9.6 oz (72.8 kg)   SpO2 95%   BMI 26.73 kg/m   Vitals:   11/28/22 1120  BP: (!) 140/86  Pulse: (!) 53  Height: 5\' 5"  (1.651 m)  Weight: 160 lb 9.6 oz (72.8 kg)  SpO2: 95%  BMI (Calculated): 26.73    Physical Exam   No results found for any visits on 11/28/22.  Recent Results (from the past 2160 hour(Damar Petit))  Urinalysis, Complete     Status: Abnormal   Collection Time: 10/23/22 10:08 AM  Result Value Ref Range   Specific Gravity, UA 1.020 1.005 - 1.030   pH, UA 6.5 5.0 - 7.5  Color, UA Yellow Yellow   Appearance Ur Clear Clear   Leukocytes,UA 1+ (A) Negative   Protein,UA Negative Negative/Trace   Glucose, UA Negative Negative   Ketones, UA Negative Negative   RBC, UA Trace (A) Negative   Bilirubin, UA Negative Negative   Urobilinogen, Ur 1.0 0.2 - 1.0 mg/dL   Nitrite, UA Negative Negative   Microscopic Examination See below:   Microscopic Examination     Status: Abnormal   Collection Time: 10/23/22 10:08 AM   Urine  Result Value Ref Range   WBC, UA 6-10 (A) 0 - 5 /hpf   RBC, Urine 3-10 (A) 0 - 2 /hpf   Epithelial Cells (non renal) 0-10 0 - 10 /hpf   Bacteria, UA Few None seen/Few      Assessment & Plan:   Problem List Items Addressed This Visit       Cardiovascular and Mediastinum   Essential hypertension - Primary   Relevant Orders   Comprehensive metabolic panel   CBC With Diff/Platelet     Endocrine   Type 2 diabetes mellitus with chronic kidney disease, with long-term current use of insulin (HCC)   Relevant  Orders   Hemoglobin A1c   Lipid panel   Hemoglobin A1c     Other   Dyslipidemia   Relevant Orders   Lipid panel   CK   Lipid panel   CK   Hepatic function panel   Other Visit Diagnoses     Furuncle of buttock       Relevant Medications   amoxicillin-clavulanate (AUGMENTIN) 500-125 MG tablet       Return in about 3 months (around 02/28/2023) for awv with labs prior.   Total time spent: 20 minutes  Luna Fuse, MD  11/28/2022   This document may have been prepared by [facility] Voice Recognition software and as such may include unintentional dictation errors.

## 2022-11-28 NOTE — Addendum Note (Signed)
Addended by: Aundria Mems AHMAD on: 11/28/2022 12:28 PM   Modules accepted: Orders

## 2022-11-29 LAB — HEPATIC FUNCTION PANEL
ALT: 16 IU/L (ref 0–32)
AST: 20 IU/L (ref 0–40)
Albumin: 4.8 g/dL (ref 3.8–4.8)
Alkaline Phosphatase: 67 IU/L (ref 44–121)
Bilirubin Total: 0.5 mg/dL (ref 0.0–1.2)
Bilirubin, Direct: 0.15 mg/dL (ref 0.00–0.40)
Total Protein: 7.3 g/dL (ref 6.0–8.5)

## 2022-11-29 LAB — LIPID PANEL
Chol/HDL Ratio: 2.2 ratio (ref 0.0–4.4)
Cholesterol, Total: 190 mg/dL (ref 100–199)
HDL: 85 mg/dL (ref >39)
LDL Chol Calc (NIH): 81 mg/dL (ref 0–99)
Triglycerides: 141 mg/dL (ref 0–149)
VLDL Cholesterol Cal: 24 mg/dL (ref 5–40)

## 2022-11-29 LAB — HEMOGLOBIN A1C
Est. average glucose Bld gHb Est-mCnc: 134 mg/dL
Hgb A1c MFr Bld: 6.3 % — ABNORMAL HIGH (ref 4.8–5.6)

## 2022-11-29 NOTE — Progress Notes (Signed)
Patient notified

## 2022-12-27 ENCOUNTER — Other Ambulatory Visit: Payer: Self-pay

## 2022-12-27 DIAGNOSIS — Z794 Long term (current) use of insulin: Secondary | ICD-10-CM

## 2022-12-27 DIAGNOSIS — I10 Essential (primary) hypertension: Secondary | ICD-10-CM

## 2022-12-27 DIAGNOSIS — E785 Hyperlipidemia, unspecified: Secondary | ICD-10-CM

## 2022-12-27 MED ORDER — METFORMIN HCL 500 MG PO TABS: 500.0000 mg | ORAL_TABLET | Freq: Every morning | ORAL | 1 refills | Status: DC

## 2022-12-27 MED ORDER — AMLODIPINE-OLMESARTAN 10-40 MG PO TABS: 1.0000 | ORAL_TABLET | Freq: Every day | ORAL | 1 refills | Status: DC

## 2023-01-09 ENCOUNTER — Other Ambulatory Visit: Payer: Self-pay

## 2023-01-12 ENCOUNTER — Ambulatory Visit (INDEPENDENT_AMBULATORY_CARE_PROVIDER_SITE_OTHER): Payer: Medicare Other | Admitting: Podiatry

## 2023-01-12 ENCOUNTER — Encounter: Payer: Self-pay | Admitting: Podiatry

## 2023-01-12 VITALS — BP 184/81 | HR 63

## 2023-01-12 DIAGNOSIS — B351 Tinea unguium: Secondary | ICD-10-CM | POA: Diagnosis not present

## 2023-01-12 DIAGNOSIS — M79675 Pain in left toe(s): Secondary | ICD-10-CM

## 2023-01-12 DIAGNOSIS — E119 Type 2 diabetes mellitus without complications: Secondary | ICD-10-CM

## 2023-01-12 DIAGNOSIS — M79674 Pain in right toe(s): Secondary | ICD-10-CM | POA: Diagnosis not present

## 2023-01-12 NOTE — Progress Notes (Signed)
   Chief Complaint  Patient presents with   Nail Problem    "My nails, toenails"    SUBJECTIVE Patient with a history of diabetes mellitus presents to office today complaining of elongated, thickened nails that cause pain while ambulating in shoes.  Patient is unable to trim their own nails. Patient is here for further evaluation and treatment.  Past Medical History:  Diagnosis Date   Diabetes mellitus without complication (HCC)    Hypercholesteremia    Hyperlipidemia    Hypertension     Allergies  Allergen Reactions   Cefoxitin Diarrhea     OBJECTIVE General Patient is awake, alert, and oriented x 3 and in no acute distress. Derm Skin is dry and supple bilateral. Negative open lesions or macerations. Remaining integument unremarkable. Nails are tender, long, thickened and dystrophic with subungual debris, consistent with onychomycosis, 1-5 bilateral. No signs of infection noted. Vasc  DP and PT pedal pulses palpable bilaterally. Temperature gradient within normal limits.  Neuro Epicritic and protective threshold sensation diminished bilaterally.  Musculoskeletal Exam No symptomatic pedal deformities noted bilateral. Muscular strength within normal limits.  ASSESSMENT 1. Diabetes Mellitus w/ peripheral neuropathy 2.  Pain due to onychomycosis of toenails bilateral  PLAN OF CARE 1. Patient evaluated today.  Comprehensive diabetic foot exam performed today. 2. Instructed to maintain good pedal hygiene and foot care. Stressed importance of controlling blood sugar.  3. Mechanical debridement of nails 1-5 bilaterally performed using a nail nipper. Filed with dremel without incident.  4. Return to clinic in 3 mos.     Felecia Shelling, DPM [facility]  Dr. Felecia Shelling, DPM    [address] [place], [place] [address]                Office [phone]  Fax [phone]

## 2023-01-26 ENCOUNTER — Ambulatory Visit
Admission: RE | Admit: 2023-01-26 | Discharge: 2023-01-26 | Disposition: A | Discharge status: Home / Self Care | Payer: Medicare Other | Source: Ambulatory Visit | Attending: Internal Medicine | Admitting: Internal Medicine

## 2023-01-26 ENCOUNTER — Ambulatory Visit (INDEPENDENT_AMBULATORY_CARE_PROVIDER_SITE_OTHER): Payer: Medicare Other | Admitting: Internal Medicine

## 2023-01-26 VITALS — BP 130/89 | HR 64 | Ht 65.0 in | Wt 162.2 lb

## 2023-01-26 DIAGNOSIS — N1832 Chronic kidney disease, stage 3b: Secondary | ICD-10-CM

## 2023-01-26 DIAGNOSIS — Z794 Long term (current) use of insulin: Secondary | ICD-10-CM | POA: Diagnosis not present

## 2023-01-26 DIAGNOSIS — E1122 Type 2 diabetes mellitus with diabetic chronic kidney disease: Secondary | ICD-10-CM

## 2023-01-26 DIAGNOSIS — Z1231 Encounter for screening mammogram for malignant neoplasm of breast: Secondary | ICD-10-CM | POA: Diagnosis present

## 2023-01-26 DIAGNOSIS — M85859 Other specified disorders of bone density and structure, unspecified thigh: Secondary | ICD-10-CM | POA: Insufficient documentation

## 2023-01-26 LAB — POCT CBG (FASTING - GLUCOSE)-MANUAL ENTRY: Glucose Fasting, POC: 142 mg/dL — AB (ref 70–99)

## 2023-01-26 NOTE — Progress Notes (Signed)
Established Patient Office Visit  Subjective:  Patient ID: Tamara [name], female    DOB: Mar 11, 1941  Age: 82 y.o. MRN: [id number]  Chief Complaint  Patient presents with   Follow-up    Wants bone density     Here to enquire about bone density. Last study was in 2021 and notable for osteopenia. Daughter concerned about her memory however scored perfectly on last cognitive screen.    No other concerns at this time.   Past Medical History:  Diagnosis Date   Diabetes mellitus without complication (HCC)    Hypercholesteremia    Hyperlipidemia    Hypertension     Past Surgical History:  Procedure Laterality Date   APPENDECTOMY     COLONOSCOPY WITH PROPOFOL N/A 08/09/2018   Procedure: COLONOSCOPY WITH PROPOFOL;  Surgeon: Christena Deem, MD;  Location: [facility] ENDOSCOPY;  Service: Endoscopy;  Laterality: N/A;   EYE SURGERY      Social History   Socioeconomic History   Marital status: Widowed    Spouse name: Not on file   Number of children: Not on file   Years of education: Not on file   Highest education level: Not on file  Occupational History   Not on file  Tobacco Use   Smoking status: Never    Passive exposure: Never   Smokeless tobacco: Never  Vaping Use   Vaping status: Never Used  Substance and Sexual Activity   Alcohol use: No   Drug use: Never   Sexual activity: Not Currently    Birth control/protection: Post-menopausal  Other Topics Concern   Not on file  Social History Narrative   Not on file   Social Determinants of Health   Financial Resource Strain: Not on file  Food Insecurity: No Food Insecurity (06/04/2022)   Hunger Vital Sign    Worried About Running Out of Food in the Last Year: Never true    Ran Out of Food in the Last Year: Never true  Transportation Needs: No Transportation Needs (06/04/2022)   PRAPARE - Administrator, Civil Service (Medical): No    Lack of Transportation (Non-Medical): No  Physical Activity: Not on  file  Stress: Not on file  Social Connections: Unknown (09/06/2022)   Received from [facility]   Social Network    Social Network: Not on file  Intimate Partner Violence: Unknown (09/06/2022)   Received from [facility]   HITS    Physically Hurt: Not on file    Insult or Talk Down To: Not on file    Threaten Physical Harm: Not on file    Scream or Curse: Not on file    Family History  Problem Relation Age of Onset   Diabetes type II Brother    Hypertension Brother    Bladder Cancer Neg Hx    Kidney cancer Neg Hx    Breast cancer Neg Hx     Allergies  Allergen Reactions   Cefoxitin Diarrhea    Review of Systems  All other systems reviewed and are negative.      Objective:   BP 130/89   Pulse 64   Ht 5\' 5"  (1.651 m)   Wt 162 lb 3.2 oz (73.6 kg)   SpO2 97%   BMI 26.99 kg/m   Vitals:   01/26/23 1131  BP: 130/89  Pulse: 64  Height: 5\' 5"  (1.651 m)  Weight: 162 lb 3.2 oz (73.6 kg)  SpO2: 97%  BMI (Calculated): 26.99  Physical Exam Vitals reviewed.  Constitutional:      General: She is not in acute distress. HENT:     Head: Normocephalic.     Nose: Nose normal.     Mouth/Throat:     Mouth: Mucous membranes are moist.  Eyes:     Extraocular Movements: Extraocular movements intact.     Pupils: Pupils are equal, round, and reactive to light.  Cardiovascular:     Rate and Rhythm: Normal rate and regular rhythm.     Heart sounds: No murmur heard. Pulmonary:     Effort: Pulmonary effort is normal.     Breath sounds: No rhonchi or rales.  Abdominal:     General: Abdomen is flat.     Palpations: There is no hepatomegaly, splenomegaly or mass.  Musculoskeletal:        General: Normal range of motion.     Cervical back: Normal range of motion. No tenderness.  Skin:    General: Skin is warm and dry.  Neurological:     General: No focal deficit present.     Mental Status: She is alert and oriented to person, place, and time.     Cranial Nerves: No  cranial nerve deficit.     Motor: No weakness.  Psychiatric:        Mood and Affect: Mood normal.        Behavior: Behavior normal.      Results for orders placed or performed in visit on 01/26/23  POCT CBG (Fasting - Glucose)  Result Value Ref Range   Glucose Fasting, POC 142 (A) 70 - 99 mg/dL        Assessment & Plan:  As per problem list  Problem List Items Addressed This Visit       Endocrine   Type 2 diabetes mellitus with chronic kidney disease, with long-term current use of insulin (HCC) - Primary   Relevant Orders   POCT CBG (Fasting - Glucose) (Completed)   Other Visit Diagnoses     Osteopenia of hip, unspecified laterality       Relevant Orders   DG Bone Density       Return in about 6 weeks (around 03/09/2023) for awv with labs prior.   Total time spent: 20 minutes  Luna Fuse, MD  01/26/2023   This document may have been prepared by [facility] Voice Recognition software and as such may include unintentional dictation errors.

## 2023-02-27 ENCOUNTER — Ambulatory Visit: Payer: Medicare Other | Admitting: Internal Medicine

## 2023-03-19 ENCOUNTER — Ambulatory Visit (INDEPENDENT_AMBULATORY_CARE_PROVIDER_SITE_OTHER): Payer: Medicare Other | Admitting: Internal Medicine

## 2023-03-19 ENCOUNTER — Encounter: Payer: Self-pay | Admitting: Internal Medicine

## 2023-03-19 VITALS — BP 160/110 | HR 54 | Ht 65.0 in | Wt 161.6 lb

## 2023-03-19 DIAGNOSIS — E1122 Type 2 diabetes mellitus with diabetic chronic kidney disease: Secondary | ICD-10-CM

## 2023-03-19 DIAGNOSIS — N1832 Chronic kidney disease, stage 3b: Secondary | ICD-10-CM

## 2023-03-19 DIAGNOSIS — Z0001 Encounter for general adult medical examination with abnormal findings: Secondary | ICD-10-CM

## 2023-03-19 DIAGNOSIS — I10 Essential (primary) hypertension: Secondary | ICD-10-CM

## 2023-03-19 DIAGNOSIS — Z794 Long term (current) use of insulin: Secondary | ICD-10-CM

## 2023-03-19 DIAGNOSIS — Z1331 Encounter for screening for depression: Secondary | ICD-10-CM

## 2023-03-19 DIAGNOSIS — E785 Hyperlipidemia, unspecified: Secondary | ICD-10-CM

## 2023-03-19 LAB — POCT CBG (FASTING - GLUCOSE)-MANUAL ENTRY: Glucose Fasting, POC: 130 mg/dL — AB (ref 70–99)

## 2023-03-19 NOTE — Progress Notes (Signed)
Established Patient Office Visit  Subjective:  Patient ID: Tamara [name], female    DOB: 06-23-1941  Age: 82 y.o. MRN: [id number]  Chief Complaint  Patient presents with   Medicare Wellness    AWV    No new complaints, here for AWV refer to quality metrics and scanned documents.  Has not taken her antihypertensives this am as she'Tamara [name] fasting. Daughter reports elevated bp in the mornings.  No other concerns at this time.   Past Medical History:  Diagnosis Date   Diabetes mellitus without complication (HCC)    Hypercholesteremia    Hyperlipidemia    Hypertension     Past Surgical History:  Procedure Laterality Date   APPENDECTOMY     COLONOSCOPY WITH PROPOFOL N/A 08/09/2018   Procedure: COLONOSCOPY WITH PROPOFOL;  Surgeon: Tamara Deem, MD;  Location: [facility] ENDOSCOPY;  Service: Endoscopy;  Laterality: N/A;   EYE SURGERY      Social History   Socioeconomic History   Marital status: Widowed    Spouse name: Not on file   Number of children: Not on file   Years of education: Not on file   Highest education level: Not on file  Occupational History   Not on file  Tobacco Use   Smoking status: Never    Passive exposure: Never   Smokeless tobacco: Never  Vaping Use   Vaping status: Never Used  Substance and Sexual Activity   Alcohol use: No   Drug use: Never   Sexual activity: Not Currently    Birth control/protection: Post-menopausal  Other Topics Concern   Not on file  Social History Narrative   Not on file   Social Determinants of Health   Financial Resource Strain: Not on file  Food Insecurity: No Food Insecurity (06/04/2022)   Hunger Vital Sign    Worried About Running Out of Food in the Last Year: Never true    Ran Out of Food in the Last Year: Never true  Transportation Needs: No Transportation Needs (06/04/2022)   PRAPARE - Administrator, Civil Service (Medical): No    Lack of Transportation (Non-Medical): No  Physical Activity: Not  on file  Stress: Not on file  Social Connections: Unknown (09/06/2022)   Received from [facility], [facility]   Social Network    Social Network: Not on file  Intimate Partner Violence: Unknown (09/06/2022)   Received from [facility], [facility]   HITS    Physically Hurt: Not on file    Insult or Talk Down To: Not on file    Threaten Physical Harm: Not on file    Scream or Curse: Not on file    Family History  Problem Relation Age of Onset   Diabetes type II Brother    Hypertension Brother    Bladder Cancer Neg Hx    Kidney cancer Neg Hx    Breast cancer Neg Hx     Allergies  Allergen Reactions   Cefoxitin Diarrhea    Review of Systems  Constitutional: Negative.   HENT: Negative.    Eyes: Negative.   Respiratory:  Positive for cough. Negative for shortness of breath.   Cardiovascular: Negative.   Gastrointestinal: Negative.   Genitourinary: Negative.   Musculoskeletal:  Negative for joint pain.  Skin: Negative.   Neurological: Negative.   Endo/Heme/Allergies: Negative.   Psychiatric/Behavioral: Negative.        Objective:   BP (!) 160/110   Pulse (!) 54   Ht  5\' 5"  (1.651 m)   Wt 161 lb 9.6 oz (73.3 kg)   SpO2 99%   BMI 26.89 kg/m   Vitals:   03/19/23 1127  BP: (!) 160/110  Pulse: (!) 54  Height: 5\' 5"  (1.651 m)  Weight: 161 lb 9.6 oz (73.3 kg)  SpO2: 99%  BMI (Calculated): 26.89    Physical Exam Vitals reviewed.  Constitutional:      General: She is not in acute distress. HENT:     Head: Normocephalic.     Nose: Nose normal.     Mouth/Throat:     Mouth: Mucous membranes are moist.  Eyes:     Extraocular Movements: Extraocular movements intact.     Pupils: Pupils are equal, round, and reactive to light.  Cardiovascular:     Rate and Rhythm: Normal rate and regular rhythm.     Heart sounds: No murmur heard. Pulmonary:     Effort: Pulmonary effort is normal.     Breath sounds: No rhonchi or rales.  Abdominal:     General:  Abdomen is flat.     Palpations: There is no hepatomegaly, splenomegaly or mass.  Musculoskeletal:        General: Normal range of motion.     Cervical back: Normal range of motion. No tenderness.  Skin:    General: Skin is warm and dry.  Neurological:     General: No focal deficit present.     Mental Status: She is alert and oriented to person, place, and time.     Cranial Nerves: No cranial nerve deficit.     Motor: No weakness.  Psychiatric:        Mood and Affect: Mood normal.        Behavior: Behavior normal.      Results for orders placed or performed in visit on 03/19/23  POCT CBG (Fasting - Glucose)  Result Value Ref Range   Glucose Fasting, POC 130 (A) 70 - 99 mg/dL    Recent Results (from the past 2160 hour([name]))  POCT CBG (Fasting - Glucose)     Status: Abnormal   Collection Time: 01/26/23 11:37 AM  Result Value Ref Range   Glucose Fasting, POC 142 (A) 70 - 99 mg/dL  POCT CBG (Fasting - Glucose)     Status: Abnormal   Collection Time: 03/19/23 11:33 AM  Result Value Ref Range   Glucose Fasting, POC 130 (A) 70 - 99 mg/dL      Assessment & Plan:  As per problem list  Problem List Items Addressed This Visit       Cardiovascular and Mediastinum   Essential hypertension - Primary     Endocrine   Type 2 diabetes mellitus with chronic kidney disease, with long-term current use of insulin (HCC)   Relevant Orders   POCT CBG (Fasting - Glucose) (Completed)     Other   Dyslipidemia    Return in about 3 months (around 06/18/2023) for fu with labs prior.   Total time spent: 40 minutes  Tamara Fuse, MD  03/19/2023   This document may have been prepared by [facility] Voice Recognition software and as such may include unintentional dictation errors.

## 2023-03-20 ENCOUNTER — Ambulatory Visit (INDEPENDENT_AMBULATORY_CARE_PROVIDER_SITE_OTHER): Payer: Medicare Other

## 2023-03-20 ENCOUNTER — Ambulatory Visit: Payer: Medicare Other | Admitting: Internal Medicine

## 2023-03-20 DIAGNOSIS — M85859 Other specified disorders of bone density and structure, unspecified thigh: Secondary | ICD-10-CM

## 2023-03-20 LAB — CBC WITH DIFF/PLATELET
Basophils Absolute: 0 10*3/uL (ref 0.0–0.2)
Basos: 1 %
EOS (ABSOLUTE): 0.1 10*3/uL (ref 0.0–0.4)
Eos: 3 %
Hematocrit: 43.7 % (ref 34.0–46.6)
Hemoglobin: 14.1 g/dL (ref 11.1–15.9)
Immature Grans (Abs): 0 10*3/uL (ref 0.0–0.1)
Immature Granulocytes: 0 %
Lymphocytes Absolute: 1.6 10*3/uL (ref 0.7–3.1)
Lymphs: 36 %
MCH: 28.7 pg (ref 26.6–33.0)
MCHC: 32.3 g/dL (ref 31.5–35.7)
MCV: 89 fL (ref 79–97)
Monocytes Absolute: 0.4 10*3/uL (ref 0.1–0.9)
Monocytes: 10 %
Neutrophils Absolute: 2.3 10*3/uL (ref 1.4–7.0)
Neutrophils: 50 %
Platelets: 337 10*3/uL (ref 150–450)
RBC: 4.92 x10E6/uL (ref 3.77–5.28)
RDW: 12.9 % (ref 11.7–15.4)
WBC: 4.5 10*3/uL (ref 3.4–10.8)

## 2023-03-20 LAB — COMPREHENSIVE METABOLIC PANEL
ALT: 14 IU/L (ref 0–32)
AST: 17 IU/L (ref 0–40)
Albumin: 4.7 g/dL (ref 3.8–4.8)
Alkaline Phosphatase: 67 IU/L (ref 44–121)
BUN/Creatinine Ratio: 18 (ref 12–28)
BUN: 20 mg/dL (ref 8–27)
Bilirubin Total: 0.8 mg/dL (ref 0.0–1.2)
CO2: 24 mmol/L (ref 20–29)
Calcium: 10.9 mg/dL — ABNORMAL HIGH (ref 8.7–10.3)
Chloride: 101 mmol/L (ref 96–106)
Creatinine, Ser: 1.14 mg/dL — ABNORMAL HIGH (ref 0.57–1.00)
Globulin, Total: 2.5 g/dL (ref 1.5–4.5)
Glucose: 131 mg/dL — ABNORMAL HIGH (ref 70–99)
Potassium: 4.2 mmol/L (ref 3.5–5.2)
Sodium: 142 mmol/L (ref 134–144)
Total Protein: 7.2 g/dL (ref 6.0–8.5)
eGFR: 49 mL/min/{1.73_m2} — ABNORMAL LOW (ref >59)

## 2023-03-20 LAB — HEMOGLOBIN A1C
Est. average glucose Bld gHb Est-mCnc: 146 mg/dL
Hgb A1c MFr Bld: 6.7 % — ABNORMAL HIGH (ref 4.8–5.6)

## 2023-03-20 LAB — LIPID PANEL
Chol/HDL Ratio: 2.2 ratio (ref 0.0–4.4)
Cholesterol, Total: 174 mg/dL (ref 100–199)
HDL: 80 mg/dL (ref >39)
LDL Chol Calc (NIH): 81 mg/dL (ref 0–99)
Triglycerides: 71 mg/dL (ref 0–149)
VLDL Cholesterol Cal: 13 mg/dL (ref 5–40)

## 2023-03-20 LAB — CK: Total CK: 65 U/L (ref 32–182)

## 2023-03-20 NOTE — Progress Notes (Signed)
Patient notified and verbalized understanding. 

## 2023-04-27 ENCOUNTER — Other Ambulatory Visit: Payer: Self-pay

## 2023-04-30 ENCOUNTER — Other Ambulatory Visit: Payer: Self-pay | Admitting: Internal Medicine

## 2023-05-07 ENCOUNTER — Telehealth: Payer: Self-pay | Admitting: Internal Medicine

## 2023-05-07 ENCOUNTER — Other Ambulatory Visit: Payer: Self-pay

## 2023-05-08 ENCOUNTER — Other Ambulatory Visit: Payer: Self-pay

## 2023-05-08 MED ORDER — ATORVASTATIN CALCIUM 20 MG PO TABS: 20.0000 mg | ORAL_TABLET | Freq: Every evening | ORAL | 1 refills | Status: DC

## 2023-05-09 ENCOUNTER — Other Ambulatory Visit: Payer: Self-pay

## 2023-05-09 NOTE — Telephone Encounter (Signed)
Entered in error

## 2023-06-11 ENCOUNTER — Other Ambulatory Visit: Payer: Self-pay | Admitting: Internal Medicine

## 2023-06-18 ENCOUNTER — Other Ambulatory Visit: Payer: Medicare Other

## 2023-06-18 ENCOUNTER — Ambulatory Visit (INDEPENDENT_AMBULATORY_CARE_PROVIDER_SITE_OTHER): Payer: Medicare Other | Admitting: Internal Medicine

## 2023-06-18 ENCOUNTER — Encounter: Payer: Self-pay | Admitting: Internal Medicine

## 2023-06-18 ENCOUNTER — Other Ambulatory Visit: Payer: Self-pay

## 2023-06-18 ENCOUNTER — Ambulatory Visit: Payer: Medicare Other | Admitting: Internal Medicine

## 2023-06-18 VITALS — BP 124/70 | HR 59 | Ht 65.0 in | Wt 161.2 lb

## 2023-06-18 DIAGNOSIS — I10 Essential (primary) hypertension: Secondary | ICD-10-CM

## 2023-06-18 DIAGNOSIS — E785 Hyperlipidemia, unspecified: Secondary | ICD-10-CM

## 2023-06-18 DIAGNOSIS — E1122 Type 2 diabetes mellitus with diabetic chronic kidney disease: Secondary | ICD-10-CM

## 2023-06-18 DIAGNOSIS — Z794 Long term (current) use of insulin: Secondary | ICD-10-CM

## 2023-06-18 DIAGNOSIS — N1832 Chronic kidney disease, stage 3b: Secondary | ICD-10-CM

## 2023-06-18 LAB — POC CREATINE & ALBUMIN,URINE
Albumin/Creatinine Ratio, Urine, POC: <30
Creatinine, POC: 50 mg/dL
Microalbumin Ur, POC: 10 mg/L

## 2023-06-18 LAB — POCT CBG (FASTING - GLUCOSE)-MANUAL ENTRY: Glucose Fasting, POC: 190 mg/dL — AB (ref 70–99)

## 2023-06-18 MED ORDER — METFORMIN HCL 500 MG PO TABS: 500.0000 mg | ORAL_TABLET | Freq: Every morning | ORAL | 1 refills | Status: DC

## 2023-06-18 MED ORDER — AMLODIPINE-OLMESARTAN 10-40 MG PO TABS: 1.0000 | ORAL_TABLET | Freq: Every day | ORAL | 1 refills | Status: DC

## 2023-06-18 NOTE — Progress Notes (Signed)
Established Patient Office Visit  Subjective:  Patient ID: Tamara [name], female    DOB: [birth year]  Age: 82 y.o. MRN: [id number]  Chief Complaint  Patient presents with   Follow-up    3 month follow up, discuss lab results.    No new complaints, here for lab review and medication refills. Failed to have previsit labs done.     No other concerns at this time.   Past Medical History:  Diagnosis Date   Diabetes mellitus without complication (HCC)    Hypercholesteremia    Hyperlipidemia    Hypertension     Past Surgical History:  Procedure Laterality Date   APPENDECTOMY     COLONOSCOPY WITH PROPOFOL N/A 08/09/2018   Procedure: COLONOSCOPY WITH PROPOFOL;  Surgeon: Christena Deem, MD;  Location: [facility] ENDOSCOPY;  Service: Endoscopy;  Laterality: N/A;   EYE SURGERY      Social History   Socioeconomic History   Marital status: Widowed    Spouse name: Not on file   Number of children: Not on file   Years of education: Not on file   Highest education level: Not on file  Occupational History   Not on file  Tobacco Use   Smoking status: Never    Passive exposure: Never   Smokeless tobacco: Never  Vaping Use   Vaping status: Never Used  Substance and Sexual Activity   Alcohol use: No   Drug use: Never   Sexual activity: Not Currently    Birth control/protection: Post-menopausal  Other Topics Concern   Not on file  Social History Narrative   Not on file   Social Drivers of Health   Financial Resource Strain: Not on file  Food Insecurity: No Food Insecurity (06/04/2022)   Hunger Vital Sign    Worried About Running Out of Food in the Last Year: Never true    Ran Out of Food in the Last Year: Never true  Transportation Needs: No Transportation Needs (06/04/2022)   PRAPARE - Administrator, Civil Service (Medical): No    Lack of Transportation (Non-Medical): No  Physical Activity: Not on file  Stress: Not on file  Social Connections: Unknown  (09/06/2022)   Received from [facility], [facility]   Social Network    Social Network: Not on file  Intimate Partner Violence: Unknown (09/06/2022)   Received from [facility], [facility]   HITS    Physically Hurt: Not on file    Insult or Talk Down To: Not on file    Threaten Physical Harm: Not on file    Scream or Curse: Not on file    Family History  Problem Relation Age of Onset   Diabetes type II Brother    Hypertension Brother    Bladder Cancer Neg Hx    Kidney cancer Neg Hx    Breast cancer Neg Hx     Allergies  Allergen Reactions   Cefoxitin Diarrhea    Outpatient Medications Prior to Visit  Medication Sig   aspirin 81 MG tablet Take 81 mg by mouth daily.   atenolol-chlorthalidone (TENORETIC) 100-25 MG tablet TAKE 1 TABLET DAILY   atorvastatin (LIPITOR) 20 MG tablet Take 1 tablet (20 mg total) by mouth every evening.   bifidobacterium infantis (ALIGN) capsule Take 1 capsule by mouth daily.   Cholecalciferol 25 MCG (1000 UT) tablet Take 1,000 Units by mouth daily.    nitrofurantoin, macrocrystal-monohydrate, (MACROBID) 100 MG capsule Take 1 capsule (100 mg total) by  mouth daily.   ONETOUCH ULTRA TEST test strip USE TO CHECK BLOOD SUGAR TWICE A DAY   Zinc 20 MG CAPS Take 1 tablet by mouth daily. Home med.   [DISCONTINUED] amLODipine-olmesartan (AZOR) 10-40 MG tablet Take 1 tablet by mouth daily.   [DISCONTINUED] metFORMIN (GLUCOPHAGE) 500 MG tablet Take 1 tablet (500 mg total) by mouth every morning.   No facility-administered medications prior to visit.    Review of Systems  Constitutional: Negative.   HENT: Negative.    Eyes: Negative.   Respiratory:  Positive for cough. Negative for shortness of breath.   Cardiovascular: Negative.   Gastrointestinal: Negative.   Genitourinary: Negative.   Musculoskeletal:  Negative for joint pain.  Skin: Negative.   Neurological: Negative.   Endo/Heme/Allergies: Negative.   Psychiatric/Behavioral: Negative.          Objective:   BP 124/70   Pulse (!) 59   Ht 5\' 5"  (1.651 m)   Wt 161 lb 3.2 oz (73.1 kg)   SpO2 96%   BMI 26.83 kg/m   Vitals:   06/18/23 1401  BP: 124/70  Pulse: (!) 59  Height: 5\' 5"  (1.651 m)  Weight: 161 lb 3.2 oz (73.1 kg)  SpO2: 96%  BMI (Calculated): 26.83    Physical Exam   Results for orders placed or performed in visit on 06/18/23  POCT CBG (Fasting - Glucose)  Result Value Ref Range   Glucose Fasting, POC 190 (A) 70 - 99 mg/dL    Recent Results (from the past 2160 hours)  POCT CBG (Fasting - Glucose)     Status: Abnormal   Collection Time: 06/18/23  2:05 PM  Result Value Ref Range   Glucose Fasting, POC 190 (A) 70 - 99 mg/dL      Assessment & Plan:   Problem List Items Addressed This Visit       Cardiovascular and Mediastinum   Essential hypertension   Relevant Medications   amLODipine-olmesartan (AZOR) 10-40 MG tablet     Endocrine   Type 2 diabetes mellitus with chronic kidney disease, with long-term current use of insulin (HCC) - Primary   Relevant Medications   amLODipine-olmesartan (AZOR) 10-40 MG tablet   metFORMIN (GLUCOPHAGE) 500 MG tablet   Other Relevant Orders   POCT CBG (Fasting - Glucose) (Completed)     Other   Dyslipidemia   Relevant Medications   metFORMIN (GLUCOPHAGE) 500 MG tablet    Return in about 3 months (around 09/16/2023) for fu with labs prior.   Total time spent: 20 minutes  Luna Fuse, MD  06/18/2023   This document may have been prepared by [facility] Voice Recognition software and as such may include unintentional dictation errors.

## 2023-06-19 LAB — COMPREHENSIVE METABOLIC PANEL
ALT: 17 [IU]/L (ref 0–32)
AST: 21 [IU]/L (ref 0–40)
Albumin: 4.8 g/dL (ref 3.8–4.8)
Alkaline Phosphatase: 66 [IU]/L (ref 44–121)
BUN/Creatinine Ratio: 21 (ref 12–28)
BUN: 27 mg/dL (ref 8–27)
Bilirubin Total: 0.6 mg/dL (ref 0.0–1.2)
CO2: 23 mmol/L (ref 20–29)
Calcium: 10.8 mg/dL — ABNORMAL HIGH (ref 8.7–10.3)
Chloride: 100 mmol/L (ref 96–106)
Creatinine, Ser: 1.29 mg/dL — ABNORMAL HIGH (ref 0.57–1.00)
Globulin, Total: 2.4 g/dL (ref 1.5–4.5)
Glucose: 132 mg/dL — ABNORMAL HIGH (ref 70–99)
Potassium: 4 mmol/L (ref 3.5–5.2)
Sodium: 140 mmol/L (ref 134–144)
Total Protein: 7.2 g/dL (ref 6.0–8.5)
eGFR: 42 mL/min/{1.73_m2} — ABNORMAL LOW (ref >59)

## 2023-06-19 LAB — LIPID PANEL
Chol/HDL Ratio: 2.2 {ratio} (ref 0.0–4.4)
Cholesterol, Total: 181 mg/dL (ref 100–199)
HDL: 84 mg/dL (ref >39)
LDL Chol Calc (NIH): 82 mg/dL (ref 0–99)
Triglycerides: 81 mg/dL (ref 0–149)
VLDL Cholesterol Cal: 15 mg/dL (ref 5–40)

## 2023-06-19 LAB — CK: Total CK: 88 U/L (ref 32–182)

## 2023-06-19 LAB — HEMOGLOBIN A1C
Est. average glucose Bld gHb Est-mCnc: 134 mg/dL
Hgb A1c MFr Bld: 6.3 % — ABNORMAL HIGH (ref 4.8–5.6)

## 2023-06-20 ENCOUNTER — Other Ambulatory Visit: Payer: Self-pay | Admitting: Internal Medicine

## 2023-06-20 DIAGNOSIS — I10 Essential (primary) hypertension: Secondary | ICD-10-CM

## 2023-06-20 MED ORDER — ATENOLOL 100 MG PO TABS: 100.0000 mg | ORAL_TABLET | Freq: Every day | ORAL | 1 refills | Status: DC

## 2023-07-02 ENCOUNTER — Telehealth: Payer: Self-pay | Admitting: Internal Medicine

## 2023-07-02 NOTE — Telephone Encounter (Signed)
Patient left VM inquiring about her lab results from 12/16. Please advise.

## 2023-07-05 ENCOUNTER — Telehealth: Payer: Self-pay

## 2023-07-05 NOTE — Telephone Encounter (Signed)
 Pt daughter called requesting clarification on the atenolol (TENORMIN) 100 MG tablet  change- I don't see anything documented in her chart to tell her why it was changed-please advise-HQ

## 2023-07-09 ENCOUNTER — Encounter: Payer: Self-pay | Admitting: Internal Medicine

## 2023-07-09 ENCOUNTER — Telehealth: Payer: Self-pay

## 2023-07-09 NOTE — Telephone Encounter (Signed)
 Patient called asking for call back about her atenolol, she said the bottle is not like it was before and she needs to know which BP meds need to be taken at night, does any of this sound familiar?

## 2023-07-23 ENCOUNTER — Ambulatory Visit: Payer: Medicare Other | Admitting: Podiatry

## 2023-07-24 ENCOUNTER — Ambulatory Visit (INDEPENDENT_AMBULATORY_CARE_PROVIDER_SITE_OTHER): Payer: 59 | Admitting: Podiatry

## 2023-07-24 ENCOUNTER — Encounter: Payer: Self-pay | Admitting: Podiatry

## 2023-07-24 DIAGNOSIS — M79674 Pain in right toe(s): Secondary | ICD-10-CM | POA: Diagnosis not present

## 2023-07-24 DIAGNOSIS — B351 Tinea unguium: Secondary | ICD-10-CM | POA: Diagnosis not present

## 2023-07-24 DIAGNOSIS — M79675 Pain in left toe(s): Secondary | ICD-10-CM | POA: Diagnosis not present

## 2023-07-24 NOTE — Progress Notes (Signed)
   Chief Complaint  Patient presents with   Diabetes    "Cut my toenails."    SUBJECTIVE Patient with a history of diabetes mellitus presents to office today complaining of elongated, thickened nails that cause pain while ambulating in shoes.  Patient is unable to trim their own nails. Patient is here for further evaluation and treatment.  Past Medical History:  Diagnosis Date   Diabetes mellitus without complication (HCC)    Hypercholesteremia    Hyperlipidemia    Hypertension     Allergies  Allergen Reactions   Cefoxitin Diarrhea     OBJECTIVE General Patient is awake, alert, and oriented x 3 and in no acute distress. Derm Skin is dry and supple bilateral. Negative open lesions or macerations. Remaining integument unremarkable. Nails are tender, long, thickened and dystrophic with subungual debris, consistent with onychomycosis, 1-5 bilateral. No signs of infection noted. Vasc  DP and PT pedal pulses palpable bilaterally. Temperature gradient within normal limits.  Neuro Epicritic and protective threshold sensation diminished bilaterally.  Musculoskeletal Exam No symptomatic pedal deformities noted bilateral. Muscular strength within normal limits.  ASSESSMENT 1. Diabetes Mellitus w/ peripheral neuropathy 2.  Pain due to onychomycosis of toenails bilateral  PLAN OF CARE 1. Patient evaluated today.  Comprehensive diabetic foot exam performed today. 2. Instructed to maintain good pedal hygiene and foot care. Stressed importance of controlling blood sugar.  3. Mechanical debridement of nails 1-5 bilaterally performed using a nail nipper. Filed with dremel without incident.  4. Return to clinic in 3 mos.     Felecia Shelling, DPM [facility]  Dr. Felecia Shelling, DPM    [address] [place], [place] [address]                Office [phone]  Fax [phone]

## 2023-08-20 ENCOUNTER — Ambulatory Visit (INDEPENDENT_AMBULATORY_CARE_PROVIDER_SITE_OTHER): Payer: Medicare Other

## 2023-08-20 ENCOUNTER — Other Ambulatory Visit: Payer: Self-pay

## 2023-08-20 ENCOUNTER — Other Ambulatory Visit: Payer: Self-pay | Admitting: Cardiology

## 2023-08-20 DIAGNOSIS — N390 Urinary tract infection, site not specified: Secondary | ICD-10-CM | POA: Diagnosis not present

## 2023-08-20 LAB — POCT URINALYSIS DIPSTICK
Bilirubin, UA: NEGATIVE
Blood, UA: NEGATIVE
Glucose, UA: NEGATIVE
Ketones, UA: NEGATIVE
Nitrite, UA: NEGATIVE
Protein, UA: POSITIVE — AB
Spec Grav, UA: >=1.03 — AB (ref 1.010–1.025)
Urobilinogen, UA: 0.2 U/dL
pH, UA: 6 (ref 5.0–8.0)

## 2023-08-20 MED ORDER — NITROFURANTOIN MONOHYD MACRO 100 MG PO CAPS: 100.0000 mg | ORAL_CAPSULE | Freq: Two times a day (BID) | ORAL | 0 refills | Status: DC

## 2023-08-21 ENCOUNTER — Encounter: Payer: Self-pay | Admitting: Urology

## 2023-08-21 ENCOUNTER — Other Ambulatory Visit: Payer: Self-pay

## 2023-08-21 ENCOUNTER — Other Ambulatory Visit: Payer: Self-pay | Admitting: Cardiology

## 2023-08-21 MED ORDER — LEVOFLOXACIN 250 MG PO TABS: 250.0000 mg | ORAL_TABLET | Freq: Every day | ORAL | 0 refills | Status: AC

## 2023-08-23 LAB — URINE CULTURE

## 2023-09-17 ENCOUNTER — Ambulatory Visit (INDEPENDENT_AMBULATORY_CARE_PROVIDER_SITE_OTHER): Payer: Medicare Other | Admitting: Internal Medicine

## 2023-09-17 ENCOUNTER — Encounter: Payer: Self-pay | Admitting: Internal Medicine

## 2023-09-17 ENCOUNTER — Other Ambulatory Visit: Payer: Self-pay | Admitting: Internal Medicine

## 2023-09-17 VITALS — BP 138/81 | HR 65 | Ht 65.0 in | Wt 160.0 lb

## 2023-09-17 DIAGNOSIS — E785 Hyperlipidemia, unspecified: Secondary | ICD-10-CM

## 2023-09-17 DIAGNOSIS — I10 Essential (primary) hypertension: Secondary | ICD-10-CM

## 2023-09-17 DIAGNOSIS — R270 Ataxia, unspecified: Secondary | ICD-10-CM

## 2023-09-17 DIAGNOSIS — E1122 Type 2 diabetes mellitus with diabetic chronic kidney disease: Secondary | ICD-10-CM | POA: Diagnosis not present

## 2023-09-17 DIAGNOSIS — N1832 Chronic kidney disease, stage 3b: Secondary | ICD-10-CM

## 2023-09-17 DIAGNOSIS — Z794 Long term (current) use of insulin: Secondary | ICD-10-CM | POA: Diagnosis not present

## 2023-09-17 LAB — POCT CBG (FASTING - GLUCOSE)-MANUAL ENTRY: Glucose Fasting, POC: 129 mg/dL — AB (ref 70–99)

## 2023-09-17 MED ORDER — ONETOUCH ULTRA TEST VI STRP: ORAL_STRIP | 2 refills | Status: AC

## 2023-09-17 NOTE — Progress Notes (Signed)
 Established Patient Office Visit  Subjective:  Patient ID: Tamara [name], female    DOB: Jul 17, 1940  Age: 83 y.o. MRN: [id number]  Chief Complaint  Patient presents with   Follow-up    3 month follow up   Pt stated that she will get her labs done after apt     No new complaints, here for lab review and medication refills. Failed to have previsit labs done. Home bg readings satisfactory. Fall two days ago without loss of consciousness or apparent injury. Says she tripped on the shower curtain. Denies any headaches or dizziness    No other concerns at this time.   Past Medical History:  Diagnosis Date   Diabetes mellitus without complication (HCC)    Hypercholesteremia    Hyperlipidemia    Hypertension     Past Surgical History:  Procedure Laterality Date   APPENDECTOMY     COLONOSCOPY WITH PROPOFOL N/A 08/09/2018   Procedure: COLONOSCOPY WITH PROPOFOL;  Surgeon: Christena Deem, MD;  Location: [facility] ENDOSCOPY;  Service: Endoscopy;  Laterality: N/A;   EYE SURGERY      Social History   Socioeconomic History   Marital status: Widowed    Spouse name: Not on file   Number of children: Not on file   Years of education: Not on file   Highest education level: Not on file  Occupational History   Not on file  Tobacco Use   Smoking status: Never    Passive exposure: Never   Smokeless tobacco: Never  Vaping Use   Vaping status: Never Used  Substance and Sexual Activity   Alcohol use: No   Drug use: Never   Sexual activity: Not Currently    Birth control/protection: Post-menopausal  Other Topics Concern   Not on file  Social History Narrative   Not on file   Social Drivers of Health   Financial Resource Strain: Not on file  Food Insecurity: No Food Insecurity (06/04/2022)   Hunger Vital Sign    Worried About Running Out of Food in the Last Year: Never true    Ran Out of Food in the Last Year: Never true  Transportation Needs: No Transportation Needs  (06/04/2022)   PRAPARE - Administrator, Civil Service (Medical): No    Lack of Transportation (Non-Medical): No  Physical Activity: Not on file  Stress: Not on file  Social Connections: Unknown (09/06/2022)   Received from [facility], [facility]   Social Network    Social Network: Not on file  Intimate Partner Violence: Unknown (09/06/2022)   Received from [facility], [facility]   HITS    Physically Hurt: Not on file    Insult or Talk Down To: Not on file    Threaten Physical Harm: Not on file    Scream or Curse: Not on file    Family History  Problem Relation Age of Onset   Diabetes type II Brother    Hypertension Brother    Bladder Cancer Neg Hx    Kidney cancer Neg Hx    Breast cancer Neg Hx     Allergies  Allergen Reactions   Cefoxitin Diarrhea    Outpatient Medications Prior to Visit  Medication Sig   amLODipine-olmesartan (AZOR) 10-40 MG tablet Take 1 tablet by mouth daily.   aspirin 81 MG tablet Take 81 mg by mouth daily.   atenolol (TENORMIN) 100 MG tablet Take 1 tablet (100 mg total) by mouth daily.   atorvastatin (LIPITOR)  20 MG tablet Take 1 tablet (20 mg total) by mouth every evening.   bifidobacterium infantis (ALIGN) capsule Take 1 capsule by mouth daily.   Cholecalciferol 25 MCG (1000 UT) tablet Take 1,000 Units by mouth daily.    metFORMIN (GLUCOPHAGE) 500 MG tablet Take 1 tablet (500 mg total) by mouth every morning.   Zinc 20 MG CAPS Take 1 tablet by mouth daily. Home med.   [DISCONTINUED] ONETOUCH ULTRA TEST test strip USE TO CHECK BLOOD SUGAR TWICE A DAY   No facility-administered medications prior to visit.    Review of Systems  Constitutional: Negative.   HENT: Negative.    Eyes: Negative.   Respiratory:  Positive for cough. Negative for shortness of breath.   Cardiovascular: Negative.   Gastrointestinal: Negative.   Genitourinary: Negative.   Musculoskeletal:  Negative for joint pain.  Skin: Negative.   Neurological:  Negative.   Endo/Heme/Allergies: Negative.   Psychiatric/Behavioral: Negative.         Objective:   BP 138/81   Pulse 65   Ht 5\' 5"  (1.651 m)   Wt 160 lb (72.6 kg)   SpO2 98%   BMI 26.63 kg/m   Vitals:   09/17/23 1327  BP: 138/81  Pulse: 65  Height: 5\' 5"  (1.651 m)  Weight: 160 lb (72.6 kg)  SpO2: 98%  BMI (Calculated): 26.63    Physical Exam Vitals reviewed.  Constitutional:      General: She is not in acute distress. HENT:     Head: Normocephalic.     Nose: Nose normal.     Mouth/Throat:     Mouth: Mucous membranes are moist.  Eyes:     Extraocular Movements: Extraocular movements intact.     Pupils: Pupils are equal, round, and reactive to light.  Cardiovascular:     Rate and Rhythm: Normal rate and regular rhythm.     Heart sounds: No murmur heard. Pulmonary:     Effort: Pulmonary effort is normal.     Breath sounds: No rhonchi or rales.  Abdominal:     General: Abdomen is flat.     Palpations: There is no hepatomegaly, splenomegaly or mass.  Musculoskeletal:        General: Normal range of motion.     Cervical back: Normal range of motion. No tenderness.  Skin:    General: Skin is warm and dry.  Neurological:     General: No focal deficit present.     Mental Status: She is alert and oriented to person, place, and time.     Cranial Nerves: No cranial nerve deficit.     Motor: No weakness.     Gait: Gait abnormal.  Psychiatric:        Mood and Affect: Mood normal.        Behavior: Behavior normal.      Results for orders placed or performed in visit on 09/17/23  POCT CBG (Fasting - Glucose)  Result Value Ref Range   Glucose Fasting, POC 129 (A) 70 - 99 mg/dL        Assessment & Plan:  As per problem list. Will need PT if further falls. Problem List Items Addressed This Visit       Cardiovascular and Mediastinum   Essential hypertension     Endocrine   Type 2 diabetes mellitus with chronic kidney disease, with long-term current use  of insulin (HCC) - Primary   Relevant Medications   glucose blood (ONETOUCH ULTRA TEST) test strip  Other Relevant Orders   POCT CBG (Fasting - Glucose) (Completed)     Other   Dyslipidemia   Other Visit Diagnoses       Ataxia           Return in about 3 months (around 12/18/2023) for fu with labs prior.   Total time spent: 20 minutes  Luna Fuse, MD  09/17/2023   This document may have been prepared by [facility] Voice Recognition software and as such may include unintentional dictation errors.

## 2023-09-18 LAB — COMPREHENSIVE METABOLIC PANEL
ALT: 18 IU/L (ref 0–32)
AST: 21 IU/L (ref 0–40)
Albumin: 4.7 g/dL (ref 3.8–4.8)
Alkaline Phosphatase: 66 IU/L (ref 44–121)
BUN/Creatinine Ratio: 10 — ABNORMAL LOW (ref 12–28)
BUN: 10 mg/dL (ref 8–27)
Bilirubin Total: 0.8 mg/dL (ref 0.0–1.2)
CO2: 23 mmol/L (ref 20–29)
Calcium: 10 mg/dL (ref 8.7–10.3)
Chloride: 103 mmol/L (ref 96–106)
Creatinine, Ser: 0.98 mg/dL (ref 0.57–1.00)
Globulin, Total: 2.1 g/dL (ref 1.5–4.5)
Glucose: 119 mg/dL — ABNORMAL HIGH (ref 70–99)
Potassium: 4 mmol/L (ref 3.5–5.2)
Sodium: 142 mmol/L (ref 134–144)
Total Protein: 6.8 g/dL (ref 6.0–8.5)
eGFR: 58 mL/min/{1.73_m2} — ABNORMAL LOW (ref >59)

## 2023-09-18 LAB — LIPID PANEL
Chol/HDL Ratio: 2 ratio (ref 0.0–4.4)
Cholesterol, Total: 176 mg/dL (ref 100–199)
HDL: 90 mg/dL (ref >39)
LDL Chol Calc (NIH): 68 mg/dL (ref 0–99)
Triglycerides: 100 mg/dL (ref 0–149)
VLDL Cholesterol Cal: 18 mg/dL (ref 5–40)

## 2023-09-18 LAB — HEMOGLOBIN A1C
Est. average glucose Bld gHb Est-mCnc: 131 mg/dL
Hgb A1c MFr Bld: 6.2 % — ABNORMAL HIGH (ref 4.8–5.6)

## 2023-09-19 ENCOUNTER — Encounter: Payer: Self-pay | Admitting: Internal Medicine

## 2023-09-19 NOTE — Progress Notes (Signed)
 Patient notified

## 2023-09-21 ENCOUNTER — Ambulatory Visit: Payer: Medicare Other | Admitting: Internal Medicine

## 2023-10-01 ENCOUNTER — Other Ambulatory Visit: Payer: Self-pay

## 2023-10-01 ENCOUNTER — Encounter: Payer: Self-pay | Admitting: Internal Medicine

## 2023-10-01 MED ORDER — FLUTICASONE PROPIONATE 50 MCG/ACT NA SUSP: 1.0000 | Freq: Every day | NASAL | 2 refills | Status: DC

## 2023-10-02 ENCOUNTER — Other Ambulatory Visit: Payer: Self-pay

## 2023-10-02 MED ORDER — FLUTICASONE PROPIONATE 50 MCG/ACT NA SUSP: 1.0000 | Freq: Every day | NASAL | 2 refills | Status: DC

## 2023-10-05 ENCOUNTER — Telehealth: Payer: Self-pay | Admitting: Internal Medicine

## 2023-10-05 NOTE — Telephone Encounter (Signed)
 Patient called in stating that her BP when she just checked it is 176/84. She wants to know what she should do. She wants to know should she go to the ED? Please advise.

## 2023-10-22 ENCOUNTER — Encounter: Payer: Self-pay | Admitting: Urology

## 2023-10-22 ENCOUNTER — Ambulatory Visit (INDEPENDENT_AMBULATORY_CARE_PROVIDER_SITE_OTHER): Payer: Self-pay | Admitting: Urology

## 2023-10-22 VITALS — BP 152/79 | HR 85 | Ht 65.0 in | Wt 157.0 lb

## 2023-10-22 DIAGNOSIS — R3129 Other microscopic hematuria: Secondary | ICD-10-CM | POA: Diagnosis not present

## 2023-10-22 DIAGNOSIS — N3020 Other chronic cystitis without hematuria: Secondary | ICD-10-CM | POA: Diagnosis not present

## 2023-10-22 LAB — URINALYSIS, COMPLETE
Bilirubin, UA: NEGATIVE
Glucose, UA: NEGATIVE
Ketones, UA: NEGATIVE
Leukocytes,UA: NEGATIVE
Nitrite, UA: NEGATIVE
Specific Gravity, UA: 1.025 (ref 1.005–1.030)
Urobilinogen, Ur: 0.2 mg/dL (ref 0.2–1.0)
pH, UA: 6 (ref 5.0–7.5)

## 2023-10-22 LAB — MICROSCOPIC EXAMINATION: Epithelial Cells (non renal): >10 /HPF — AB (ref 0–10)

## 2023-10-22 MED ORDER — NITROFURANTOIN MACROCRYSTAL 100 MG PO CAPS: 100.0000 mg | ORAL_CAPSULE | Freq: Every day | ORAL | 11 refills | Status: DC

## 2023-10-22 NOTE — Progress Notes (Signed)
 10/22/2023 10:12 AM   Tamara [name] [birth year] [id number]  Referring provider: Shari Daughters, MD [address] [place],  [place] [address]  No chief complaint on file.   HPI: The patient was discharged in July 2018 and had had acute encephalopathy with urinary tract infection and sepsis. Culture was positive. Her renal ultrasound in July was normal   I follow the patient in [place] and I have had her on daily trimethoprim  for 2 or 3 years. She still gets approximate 2 infections a year and her symptoms were a little bit nonspecific. Her daughter from Tamara [name] was here and there is no question that about every 2 year she can end up in the hospital with CNS symptoms and she often gets foul smelling urine. She believes her urine is a bit foul-smelling in the morning now.   Patient had been on daily Macrodantin  and then saw a nurse practitioner February 17, 2019.  She had been placed on Augmentin  by primary care for a lesion in the pelvic inguinal crease on the left side.  She was on the Macrodantin .  Apparently was also having pain around the rectum.  Patient did have a positive urine culture and microscopic hematuria that day   I found the patient's history little bit challenging and she was not clear if she was on prophylaxis but she does take a probiotic.   The patient was cleared with a normal urinalysis.  She did have a positive culture on the day.  She is on daily Macrodantin .  Prescription renewed and I will see in 1 year   TOday No infections on daily Macrobid .  90 x 3 sent to pharmacy.  Frequency stable.  Now living with daughter.  Clinically not infected Today she had red blood cells in the urine.  There was few bacteria.  Workup described. Takes daily aspirin  but no blood thinners.  No smoking history    Patient has microscopic hematuria.  Because of her daughter's travel from just outside [place] in [place]  we will try to do both test the same day.  There was  nontender after urine sent for culture but clinically not infected.   Today I last saw the patient 1 year ago.  CT was ordered but not performed. She had a positive urine culture in February 2025 She said she done very well on it except for that 1 breakthrough infection.  Clinically not infected today Urinalysis positive for red blood cells   PMH: Past Medical History:  Diagnosis Date   Diabetes mellitus without complication (HCC)    Hypercholesteremia    Hyperlipidemia    Hypertension     Surgical History: Past Surgical History:  Procedure Laterality Date   APPENDECTOMY     COLONOSCOPY WITH PROPOFOL  N/A 08/09/2018   Procedure: COLONOSCOPY WITH PROPOFOL ;  Surgeon: Deveron Fly, MD;  Location: [facility] ENDOSCOPY;  Service: Endoscopy;  Laterality: N/A;   EYE SURGERY      Home Medications:  Allergies as of 10/22/2023       Reactions   Cefoxitin Diarrhea        Medication List        Accurate as of October 22, 2023 10:12 AM. If you have any questions, ask your nurse or doctor.          amLODipine -olmesartan  10-40 MG tablet Commonly known as: AZOR  Take 1 tablet by mouth daily.   aspirin  81 MG tablet Take 81 mg by mouth daily.   atenolol  100 MG  tablet Commonly known as: Tenormin  Take 1 tablet (100 mg total) by mouth daily.   atorvastatin  20 MG tablet Commonly known as: LIPITOR Take 1 tablet (20 mg total) by mouth every evening.   bifidobacterium infantis capsule Take 1 capsule by mouth daily.   Cholecalciferol  25 MCG (1000 UT) tablet Take 1,000 Units by mouth daily.   fluticasone  50 MCG/ACT nasal spray Commonly known as: FLONASE  Place 1 spray into both nostrils daily.   metFORMIN  500 MG tablet Commonly known as: GLUCOPHAGE  Take 1 tablet (500 mg total) by mouth every morning.   OneTouch Ultra Test test strip Generic drug: glucose blood Use as instructed   Zinc  20 MG Caps Take 1 tablet by mouth daily. Home med.        Allergies:  Allergies   Allergen Reactions   Cefoxitin Diarrhea    Family History: Family History  Problem Relation Age of Onset   Diabetes type II Brother    Hypertension Brother    Bladder Cancer Neg Hx    Kidney cancer Neg Hx    Breast cancer Neg Hx     Social History:  reports that she has never smoked. She has never been exposed to tobacco smoke. She has never used smokeless tobacco. She reports that she does not drink alcohol and does not use drugs.  ROS:                                        Physical Exam: There were no vitals taken for this visit.  Constitutional:  Alert and oriented, No acute distress. HEENT: [place] AT, moist mucus membranes.  Trachea midline, no masses.   Laboratory Data: Lab Results  Component Value Date   WBC 4.5 03/19/2023   HGB 14.1 03/19/2023   HCT 43.7 03/19/2023   MCV 89 03/19/2023   PLT 337 03/19/2023    Lab Results  Component Value Date   CREATININE 0.98 09/17/2023    No results found for: "PSA"  No results found for: "TESTOSTERONE"  Lab Results  Component Value Date   HGBA1C 6.2 (H) 09/17/2023    Urinalysis    Component Value Date/Time   COLORURINE YELLOW (A) 01/09/2017 1725   APPEARANCEUR Clear 10/23/2022 1008   LABSPEC 1.010 06/23/2017 1907   LABSPEC 1.017 10/21/2011 1422   PHURINE 6.0 06/23/2017 1907   GLUCOSEU Negative 10/23/2022 1008   GLUCOSEU Negative 10/21/2011 1422   HGBUR TRACE (A) 06/23/2017 1907   BILIRUBINUR Negative 08/20/2023 1035   BILIRUBINUR Negative 10/23/2022 1008   BILIRUBINUR Negative 10/21/2011 1422   KETONESUR NEGATIVE 06/23/2017 1907   PROTEINUR Positive (A) 08/20/2023 1035   PROTEINUR Negative 10/23/2022 1008   PROTEINUR NEGATIVE 06/23/2017 1907   UROBILINOGEN 0.2 08/20/2023 1035   UROBILINOGEN 0.2 06/23/2017 1907   NITRITE Negative 08/20/2023 1035   NITRITE Negative 10/23/2022 1008   NITRITE NEGATIVE 06/23/2017 1907   LEUKOCYTESUR Trace (A) 08/20/2023 1035   LEUKOCYTESUR 1+ (A)  10/23/2022 1008   LEUKOCYTESUR 2+ 10/21/2011 1422    Pertinent Imaging: Urine positive  Assessment & Plan: Patient doing very well on Macrobid  with 1 positive culture breakthrough.  Prescription renewed with 90 x 3.  Workup for microscopic hematuria discussed.  We will call if CT scan abnormal.  Come back in 3 months for safety cystoscopy.  She wants to bring her daughter.  I think she would remember this discussion from last year  1. Chronic cystitis (Primary)  - Urinalysis, Complete  2. Microscopic hematuria  - Urinalysis, Complete   No follow-ups on file.  Devorah Fonder, MD  [facility] [address] [place], [place] [address] [phone]

## 2023-10-22 NOTE — Patient Instructions (Addendum)
 Scheduling Phone number [phone]  Cystoscopy Cystoscopy is a procedure that is used to help diagnose and sometimes treat conditions that affect the lower urinary tract. The lower urinary tract includes the bladder and the urethra. The urethra is the tube that drains urine from the bladder. Cystoscopy is done using a thin, tube-shaped instrument with a light and camera at the end (cystoscope). The cystoscope may be hard or flexible, depending on the goal of the procedure. The cystoscope is inserted through the urethra, into the bladder. Cystoscopy may be recommended if you have: Urinary tract infections that keep coming back. Blood in the urine (hematuria). An inability to control when you urinate (urinary incontinence) or an overactive bladder. Unusual cells found in a urine sample. A blockage in the urethra, such as a urinary stone. Painful urination. An abnormality in the bladder found during an intravenous pyelogram (IVP) or CT scan. What are the risks? Generally, this is a safe procedure. However, problems may occur, including: Infection. Bleeding.  What happens during the procedure?  You will be given one or more of the following: A medicine to numb the area (local anesthetic). The area around the opening of your urethra will be cleaned. The cystoscope will be passed through your urethra into your bladder. Germ-free (sterile) fluid will flow through the cystoscope to fill your bladder. The fluid will stretch your bladder so that your health care provider can clearly examine your bladder walls. Your doctor will look at the urethra and bladder. The cystoscope will be removed The procedure may vary among health care providers  What can I expect after the procedure? After the procedure, it is common to have: Some soreness or pain in your urethra. Urinary symptoms. These include: Mild pain or burning when you urinate. Pain should stop within a few minutes after you urinate. This  may last for up to a few days after the procedure. A small amount of blood in your urine for several days. Feeling like you need to urinate but producing only a small amount of urine. Follow these instructions at home: General instructions Return to your normal activities as told by your health care provider.  Drink plenty of fluids after the procedure. Keep all follow-up visits as told by your health care provider. This is important. Contact a health care provider if you: Have pain that gets worse or does not get better with medicine, especially pain when you urinate lasting longer than 72 hours after the procedure. Have trouble urinating. Get help right away if you: Have blood clots in your urine. Have a fever or chills. Are unable to urinate. Summary Cystoscopy is a procedure that is used to help diagnose and sometimes treat conditions that affect the lower urinary tract. Cystoscopy is done using a thin, tube-shaped instrument with a light and camera at the end. After the procedure, it is common to have some soreness or pain in your urethra. It is normal to have blood in your urine after the procedure.  If you were prescribed an antibiotic medicine, take it as told by your health care provider.  This information is not intended to replace advice given to you by your health care provider. Make sure you discuss any questions you have with your health care provider. Document Revised: 06/11/2018 Document Reviewed: 06/11/2018 Elsevier Patient Education  2020 [employer].

## 2023-10-23 ENCOUNTER — Ambulatory Visit: Payer: 59 | Admitting: Podiatry

## 2023-10-25 LAB — CULTURE, URINE COMPREHENSIVE

## 2023-10-30 ENCOUNTER — Telehealth: Payer: Self-pay | Admitting: Urology

## 2023-10-30 DIAGNOSIS — N3020 Other chronic cystitis without hematuria: Secondary | ICD-10-CM

## 2023-10-30 DIAGNOSIS — R3129 Other microscopic hematuria: Secondary | ICD-10-CM

## 2023-10-30 NOTE — Telephone Encounter (Signed)
 Pt LMOM that the wrong meds were sent in to her pharmacy.  She said she went to pick up and it was different from what she had been taking.

## 2023-10-31 MED ORDER — NITROFURANTOIN MONOHYD MACRO 100 MG PO CAPS: 100.0000 mg | ORAL_CAPSULE | Freq: Two times a day (BID) | ORAL | 0 refills | Status: DC

## 2023-10-31 NOTE — Addendum Note (Signed)
 Addended byKatina Parlor on: 10/31/2023 02:26 PM   Modules accepted: Orders

## 2023-10-31 NOTE — Telephone Encounter (Signed)
 LVM pt informing her that [facility], informed pt to call back if she had any questions.

## 2023-10-31 NOTE — Telephone Encounter (Signed)
 Error, patient would like RX sent to [facility]. Please advise patient.

## 2023-10-31 NOTE — Telephone Encounter (Signed)
 Patient called again, requesting for the correct medication to be sent to [facility], not [facility]. Please advise patient.

## 2023-11-07 ENCOUNTER — Encounter: Payer: Self-pay | Admitting: Urology

## 2023-11-07 ENCOUNTER — Other Ambulatory Visit: Payer: Self-pay

## 2023-11-07 DIAGNOSIS — R3129 Other microscopic hematuria: Secondary | ICD-10-CM

## 2023-11-07 DIAGNOSIS — N3020 Other chronic cystitis without hematuria: Secondary | ICD-10-CM

## 2023-11-07 MED ORDER — NITROFURANTOIN MONOHYD MACRO 100 MG PO CAPS: 100.0000 mg | ORAL_CAPSULE | Freq: Every day | ORAL | 3 refills | Status: AC

## 2023-11-07 NOTE — Telephone Encounter (Signed)
 Pt sent MyChart message stating we sent 14 capsules of the Macrobid  to take twice a day. Pt was informed to take one capsule of Macrobid  once a day. Pt was informed that refills were sent in for [facility].

## 2023-11-13 ENCOUNTER — Encounter: Payer: Self-pay | Admitting: Podiatry

## 2023-11-13 ENCOUNTER — Ambulatory Visit (INDEPENDENT_AMBULATORY_CARE_PROVIDER_SITE_OTHER): Admitting: Podiatry

## 2023-11-13 DIAGNOSIS — M79675 Pain in left toe(s): Secondary | ICD-10-CM

## 2023-11-13 DIAGNOSIS — B351 Tinea unguium: Secondary | ICD-10-CM | POA: Diagnosis not present

## 2023-11-13 DIAGNOSIS — M79674 Pain in right toe(s): Secondary | ICD-10-CM | POA: Diagnosis not present

## 2023-11-13 DIAGNOSIS — E1151 Type 2 diabetes mellitus with diabetic peripheral angiopathy without gangrene: Secondary | ICD-10-CM

## 2023-11-13 DIAGNOSIS — R600 Localized edema: Secondary | ICD-10-CM

## 2023-11-13 NOTE — Progress Notes (Signed)
   Chief Complaint  Patient presents with   Diabetes    "Just trim my toenails."    SUBJECTIVE Patient with a history of diabetes mellitus presents to office today complaining of elongated, thickened nails that cause pain while ambulating in shoes.  Patient is unable to trim their own nails.  She also has noticed significant swelling to the bilateral lower extremities over the last 4-5 months with discoloration of the skin noted to the feet bilateral.  Patient is here for further evaluation and treatment.  Past Medical History:  Diagnosis Date   Diabetes mellitus without complication (HCC)    Hypercholesteremia    Hyperlipidemia    Hypertension     Allergies  Allergen Reactions   Cefoxitin Diarrhea     OBJECTIVE General Patient is awake, alert, and oriented x 3 and in no acute distress. Derm Skin is dry and supple bilateral. Negative open lesions or macerations. Remaining integument unremarkable. Nails are tender, long, thickened and dystrophic with subungual debris, consistent with onychomycosis, 1-5 bilateral. No signs of infection noted. Vasc heavy edema noted bilateral lower extremities with venous insufficiency and skin discoloration to the bilateral feet Neuro light touch and protective threshold sensation diminished bilaterally.  Musculoskeletal Exam No symptomatic pedal deformities noted bilateral.  No pain  ASSESSMENT 1. Diabetes Mellitus w/ peripheral neuropathy 2.  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  Patient has a pair that she currently does not wear.  Her diuretic medication was also adjusted towards the beginning of the new year.  This is when the edema began.  She has a follow-up appointment with her PCP next month.  Recommend discussing -Order placed for ABIs bilateral lower extremities to establish vascular  baseline -Mechanical debridement of nails 1-5 bilateral was performed using a nail nipper without incident or bleeding.  Smoothed with a rotary bur -Return to clinic 3 months routine footcare    Dot Gazella, DPM [facility]  Dr. Dot Gazella, DPM    [address] [place], [place] [address]                Office [phone]  Fax [phone]

## 2023-11-16 ENCOUNTER — Ambulatory Visit: Admitting: Podiatry

## 2023-11-16 ENCOUNTER — Telehealth: Payer: Self-pay

## 2023-11-16 NOTE — Telephone Encounter (Signed)
 Patient LVM stating that she was given a nasal spray but she's having trouble sleeping at night bc of the noise "its" making, she said " I  dont be snoring but I dont be sleeping" I will follow up with patient to find out what's going on with her and determine if she needs an appt.

## 2023-11-19 ENCOUNTER — Ambulatory Visit: Admitting: Internal Medicine

## 2023-11-19 VITALS — BP 144/86 | HR 70 | Temp 97.7°F | Ht 65.0 in | Wt 164.8 lb

## 2023-11-19 DIAGNOSIS — N390 Urinary tract infection, site not specified: Secondary | ICD-10-CM

## 2023-11-19 DIAGNOSIS — N1832 Chronic kidney disease, stage 3b: Secondary | ICD-10-CM | POA: Diagnosis not present

## 2023-11-19 DIAGNOSIS — I10 Essential (primary) hypertension: Secondary | ICD-10-CM

## 2023-11-19 DIAGNOSIS — E785 Hyperlipidemia, unspecified: Secondary | ICD-10-CM | POA: Diagnosis not present

## 2023-11-19 DIAGNOSIS — Z794 Long term (current) use of insulin: Secondary | ICD-10-CM | POA: Diagnosis not present

## 2023-11-19 DIAGNOSIS — N1831 Chronic kidney disease, stage 3a: Secondary | ICD-10-CM

## 2023-11-19 DIAGNOSIS — E1122 Type 2 diabetes mellitus with diabetic chronic kidney disease: Secondary | ICD-10-CM | POA: Diagnosis not present

## 2023-11-19 DIAGNOSIS — I5031 Acute diastolic (congestive) heart failure: Secondary | ICD-10-CM

## 2023-11-19 LAB — POCT CBG (FASTING - GLUCOSE)-MANUAL ENTRY: Glucose Fasting, POC: 95 mg/dL (ref 70–99)

## 2023-11-19 MED ORDER — POTASSIUM CHLORIDE CRYS ER 10 MEQ PO TBCR: 10.0000 meq | EXTENDED_RELEASE_TABLET | Freq: Every day | ORAL | 0 refills | Status: DC

## 2023-11-19 MED ORDER — FUROSEMIDE 40 MG PO TABS: 40.0000 mg | ORAL_TABLET | Freq: Every day | ORAL | 0 refills | Status: DC

## 2023-11-19 MED ORDER — ATENOLOL 100 MG PO TABS: 100.0000 mg | ORAL_TABLET | Freq: Every day | ORAL | 1 refills | Status: DC

## 2023-11-19 MED ORDER — AMLODIPINE-OLMESARTAN 10-40 MG PO TABS: 1.0000 | ORAL_TABLET | Freq: Every day | ORAL | 1 refills | Status: DC

## 2023-11-19 MED ORDER — ATORVASTATIN CALCIUM 20 MG PO TABS: 20.0000 mg | ORAL_TABLET | Freq: Every evening | ORAL | 1 refills | Status: DC

## 2023-11-19 MED ORDER — METFORMIN HCL 500 MG PO TABS: 500.0000 mg | ORAL_TABLET | Freq: Every morning | ORAL | 1 refills | Status: DC

## 2023-11-19 NOTE — Telephone Encounter (Signed)
 Pt had appt today.

## 2023-11-19 NOTE — Progress Notes (Signed)
 Established Patient Office Visit  Subjective:  Patient ID: Tamara [name], female    DOB: [birth year]  Age: 83 y.o. MRN: [id number]  Chief Complaint  Patient presents with   Acute Visit    Edema in both leg x 2 weeks, rattling in chest    C/o loud raspy breathing and a cough x 1-2 wks worse at night when she lies down and relieved by sitting up in a recliner. Denies fever or chills. Today she also c/o worsening bilateral edema.      No other concerns at this time.   Past Medical History:  Diagnosis Date   Diabetes mellitus without complication (HCC)    Hypercholesteremia    Hyperlipidemia    Hypertension     Past Surgical History:  Procedure Laterality Date   APPENDECTOMY     COLONOSCOPY WITH PROPOFOL  N/A 08/09/2018   Procedure: COLONOSCOPY WITH PROPOFOL ;  Surgeon: Deveron Fly, MD;  Location: [facility] ENDOSCOPY;  Service: Endoscopy;  Laterality: N/A;   EYE SURGERY      Social History   Socioeconomic History   Marital status: Widowed    Spouse name: Not on file   Number of children: Not on file   Years of education: Not on file   Highest education level: Not on file  Occupational History   Not on file  Tobacco Use   Smoking status: Never    Passive exposure: Never   Smokeless tobacco: Never  Vaping Use   Vaping status: Never Used  Substance and Sexual Activity   Alcohol use: No   Drug use: Never   Sexual activity: Not Currently    Birth control/protection: Post-menopausal  Other Topics Concern   Not on file  Social History Narrative   Not on file   Social Drivers of Health   Financial Resource Strain: Not on file  Food Insecurity: No Food Insecurity (06/04/2022)   Hunger Vital Sign    Worried About Running Out of Food in the Last Year: Never true    Ran Out of Food in the Last Year: Never true  Transportation Needs: No Transportation Needs (06/04/2022)   PRAPARE - Administrator, Civil Service (Medical): No    Lack of Transportation  (Non-Medical): No  Physical Activity: Not on file  Stress: Not on file  Social Connections: Unknown (09/06/2022)   Received from [facility]   Social Network    Social Network: Not on file  Intimate Partner Violence: Unknown (09/06/2022)   Received from [facility]   HITS    Physically Hurt: Not on file    Insult or Talk Down To: Not on file    Threaten Physical Harm: Not on file    Scream or Curse: Not on file    Family History  Problem Relation Age of Onset   Diabetes type II Brother    Hypertension Brother    Bladder Cancer Neg Hx    Kidney cancer Neg Hx    Breast cancer Neg Hx     Allergies  Allergen Reactions   Cefoxitin Diarrhea    Outpatient Medications Prior to Visit  Medication Sig   aspirin  81 MG tablet Take 81 mg by mouth daily.   bifidobacterium infantis (ALIGN) capsule Take 1 capsule by mouth daily.   Cholecalciferol  25 MCG (1000 UT) tablet Take 1,000 Units by mouth daily.    fluticasone  (FLONASE ) 50 MCG/ACT nasal spray Place 1 spray into both nostrils daily.   glucose  blood (ONETOUCH ULTRA TEST) test strip Use as instructed   nitrofurantoin , macrocrystal-monohydrate, (MACROBID ) 100 MG capsule Take 1 capsule (100 mg total) by mouth daily.   Zinc  20 MG CAPS Take 1 tablet by mouth daily. Home med.   [DISCONTINUED] amLODipine -olmesartan  (AZOR ) 10-40 MG tablet Take 1 tablet by mouth daily.   [DISCONTINUED] atenolol  (TENORMIN ) 100 MG tablet Take 1 tablet (100 mg total) by mouth daily.   [DISCONTINUED] atorvastatin  (LIPITOR) 20 MG tablet Take 1 tablet (20 mg total) by mouth every evening.   [DISCONTINUED] metFORMIN  (GLUCOPHAGE ) 500 MG tablet Take 1 tablet (500 mg total) by mouth every morning.   No facility-administered medications prior to visit.    Review of Systems  Constitutional: Negative.   HENT: Negative.    Eyes: Negative.   Respiratory:  Positive for cough. Negative for shortness of breath.   Cardiovascular:  Positive  for orthopnea.  Gastrointestinal: Negative.   Genitourinary: Negative.   Musculoskeletal:  Negative for joint pain.  Skin: Negative.   Neurological: Negative.   Endo/Heme/Allergies: Negative.   Psychiatric/Behavioral: Negative.         Objective:   BP (!) 144/86   Pulse 70   Temp 97.7 F (36.5 C) (Tympanic)   Ht 5\' 5"  (1.651 m)   Wt 164 lb 12.8 oz (74.8 kg)   SpO2 96%   BMI 27.42 kg/m   Vitals:   11/19/23 1330  BP: (!) 144/86  Pulse: 70  Temp: 97.7 F (36.5 C)  Height: 5\' 5"  (1.651 m)  Weight: 164 lb 12.8 oz (74.8 kg)  SpO2: 96%  TempSrc: Tympanic  BMI (Calculated): 27.42    Physical Exam Vitals reviewed.  Constitutional:      General: She is not in acute distress. HENT:     Head: Normocephalic.     Nose: Nose normal.     Mouth/Throat:     Mouth: Mucous membranes are moist.  Eyes:     Extraocular Movements: Extraocular movements intact.     Pupils: Pupils are equal, round, and reactive to light.  Neck:     Vascular: JVD (6 cm) present.  Cardiovascular:     Rate and Rhythm: Normal rate and regular rhythm.     Heart sounds: No murmur heard. Pulmonary:     Effort: Pulmonary effort is normal.     Breath sounds: No rhonchi or rales.  Abdominal:     General: Abdomen is flat.     Palpations: There is no hepatomegaly, splenomegaly or mass.  Musculoskeletal:        General: Normal range of motion.     Cervical back: Normal range of motion. No tenderness.     Right lower leg: 4+ Edema present.     Left lower leg: 4+ Edema present.  Skin:    General: Skin is warm and dry.  Neurological:     General: No focal deficit present.     Mental Status: She is alert and oriented to person, place, and time.     Cranial Nerves: No cranial nerve deficit.     Motor: No weakness.     Gait: Gait abnormal.  Psychiatric:        Mood and Affect: Mood normal.        Behavior: Behavior normal.      Results for orders placed or performed in visit on 11/19/23  POCT CBG  (Fasting - Glucose)  Result Value Ref Range   Glucose Fasting, POC 95 70 - 99 mg/dL    Recent  Results (from the past 2160 hours)  POCT CBG (Fasting - Glucose)     Status: Abnormal   Collection Time: 09/17/23  1:31 PM  Result Value Ref Range   Glucose Fasting, POC 129 (A) 70 - 99 mg/dL  Comprehensive metabolic panel     Status: Abnormal   Collection Time: 09/17/23  2:15 PM  Result Value Ref Range   Glucose 119 (H) 70 - 99 mg/dL   BUN 10 8 - 27 mg/dL   Creatinine, Ser 4.09 0.57 - 1.00 mg/dL   eGFR 58 (L) >81 XB/JYN/8.29   BUN/Creatinine Ratio 10 (L) 12 - 28   Sodium 142 134 - 144 mmol/L   Potassium 4.0 3.5 - 5.2 mmol/L   Chloride 103 96 - 106 mmol/L   CO2 23 20 - 29 mmol/L   Calcium  10.0 8.7 - 10.3 mg/dL   Total Protein 6.8 6.0 - 8.5 g/dL   Albumin 4.7 3.8 - 4.8 g/dL   Globulin, Total 2.1 1.5 - 4.5 g/dL   Bilirubin Total 0.8 0.0 - 1.2 mg/dL   Alkaline Phosphatase 66 44 - 121 IU/L   AST 21 0 - 40 IU/L   ALT 18 0 - 32 IU/L  Lipid panel     Status: None   Collection Time: 09/17/23  2:15 PM  Result Value Ref Range   Cholesterol, Total 176 100 - 199 mg/dL   Triglycerides 562 0 - 149 mg/dL   HDL 90 >13 mg/dL   VLDL Cholesterol Cal 18 5 - 40 mg/dL   LDL Chol Calc (NIH) 68 0 - 99 mg/dL   Chol/HDL Ratio 2.0 0.0 - 4.4 ratio    Comment:                                   T. Chol/HDL Ratio                                             Men  Women                               1/2 Avg.Risk  3.4    3.3                                   Avg.Risk  5.0    4.4                                2X Avg.Risk  9.6    7.1                                3X Avg.Risk 23.4   11.0   Hemoglobin A1c     Status: Abnormal   Collection Time: 09/17/23  2:15 PM  Result Value Ref Range   Hgb A1c MFr Bld 6.2 (H) 4.8 - 5.6 %    Comment:          Prediabetes: 5.7 - 6.4          Diabetes: >6.4          Glycemic control for  adults with diabetes: <7.0    Est. average glucose Bld gHb Est-mCnc 131 mg/dL   Urinalysis, Complete     Status: Abnormal   Collection Time: 10/22/23 10:12 AM  Result Value Ref Range   Specific Gravity, UA 1.025 1.005 - 1.030   pH, UA 6.0 5.0 - 7.5   Color, UA Yellow Yellow   Appearance Ur Clear Clear   Leukocytes,UA Negative Negative   Protein,UA 2+ (A) Negative/Trace   Glucose, UA Negative Negative   Ketones, UA Negative Negative   RBC, UA Trace (A) Negative   Bilirubin, UA Negative Negative   Urobilinogen, Ur 0.2 0.2 - 1.0 mg/dL   Nitrite, UA Negative Negative   Microscopic Examination See below:   Microscopic Examination     Status: Abnormal   Collection Time: 10/22/23 10:12 AM   Urine  Result Value Ref Range   WBC, UA 0-5 0 - 5 /hpf   RBC, Urine 3-10 (A) 0 - 2 /hpf   Epithelial Cells (non renal) >10 (A) 0 - 10 /hpf   Mucus, UA Present (A) Not Estab.   Bacteria, UA Few None seen/Few  CULTURE, URINE COMPREHENSIVE     Status: None   Collection Time: 10/22/23 10:21 AM   Specimen: Urine   UR  Result Value Ref Range   Urine Culture, Comprehensive Final report    Organism ID, Bacteria Comment     Comment: Mixed urogenital flora 10,000-25,000 colony forming units per mL   POCT CBG (Fasting - Glucose)     Status: Normal   Collection Time: 11/19/23  1:43 PM  Result Value Ref Range   Glucose Fasting, POC 95 70 - 99 mg/dL      Assessment & Plan:  As per problem list.  Problem List Items Addressed This Visit       Cardiovascular and Mediastinum   Essential hypertension   Relevant Medications   atenolol  (TENORMIN ) 100 MG tablet   amLODipine -olmesartan  (AZOR ) 10-40 MG tablet   atorvastatin  (LIPITOR) 20 MG tablet   furosemide  (LASIX ) 40 MG tablet     Endocrine   Type 2 diabetes mellitus with chronic kidney disease, with long-term current use of insulin  (HCC) - Primary   Relevant Medications   amLODipine -olmesartan  (AZOR ) 10-40 MG tablet   metFORMIN  (GLUCOPHAGE ) 500 MG tablet   atorvastatin  (LIPITOR) 20 MG tablet   Other Relevant Orders    POCT CBG (Fasting - Glucose) (Completed)   DG Chest 2 View     Genitourinary   UTI (urinary tract infection)   Relevant Orders   POCT Urinalysis Dipstick (16109)     Other   Dyslipidemia   Relevant Medications   metFORMIN  (GLUCOPHAGE ) 500 MG tablet   atorvastatin  (LIPITOR) 20 MG tablet   Other Visit Diagnoses       Acute diastolic CHF (congestive heart failure) (HCC)       Relevant Medications   atenolol  (TENORMIN ) 100 MG tablet   amLODipine -olmesartan  (AZOR ) 10-40 MG tablet   atorvastatin  (LIPITOR) 20 MG tablet   furosemide  (LASIX ) 40 MG tablet   potassium chloride  (KLOR-CON  M) 10 MEQ tablet   Other Relevant Orders   ECHOCARDIOGRAM COMPLETE   Ambulatory referral to Cardiology       Return if symptoms worsen or fail to improve.   Total time spent: 30 minutes  Arzella Bitters, MD  11/19/2023   This document may have been prepared by [facility] Voice Recognition software and as such may include unintentional dictation errors.

## 2023-11-19 NOTE — Telephone Encounter (Signed)
 Patients daughter Mariah Shines [phone] called stating that patient has pitting edema in both legs and they are warm to touch. Mariah Shines also states that when she lays down she has crackling and wheezing in her chest. Mariah Shines has stated that she prefers to see you if you're ok with being double booked at 1:30pm today? If not I can put her on to see Amber? Please adv?

## 2023-11-20 ENCOUNTER — Ambulatory Visit (INDEPENDENT_AMBULATORY_CARE_PROVIDER_SITE_OTHER)

## 2023-11-20 ENCOUNTER — Ambulatory Visit: Payer: Self-pay | Admitting: Internal Medicine

## 2023-11-20 DIAGNOSIS — I5031 Acute diastolic (congestive) heart failure: Secondary | ICD-10-CM

## 2023-11-20 DIAGNOSIS — Z794 Long term (current) use of insulin: Secondary | ICD-10-CM

## 2023-11-20 DIAGNOSIS — I340 Nonrheumatic mitral (valve) insufficiency: Secondary | ICD-10-CM | POA: Diagnosis not present

## 2023-11-20 DIAGNOSIS — R051 Acute cough: Secondary | ICD-10-CM | POA: Diagnosis not present

## 2023-11-22 ENCOUNTER — Encounter: Payer: Self-pay | Admitting: Cardiovascular Disease

## 2023-11-22 ENCOUNTER — Ambulatory Visit (INDEPENDENT_AMBULATORY_CARE_PROVIDER_SITE_OTHER): Admitting: Cardiovascular Disease

## 2023-11-22 VITALS — BP 170/80 | HR 70 | Ht 65.0 in | Wt 164.4 lb

## 2023-11-22 DIAGNOSIS — I340 Nonrheumatic mitral (valve) insufficiency: Secondary | ICD-10-CM

## 2023-11-22 DIAGNOSIS — N1832 Chronic kidney disease, stage 3b: Secondary | ICD-10-CM

## 2023-11-22 DIAGNOSIS — E785 Hyperlipidemia, unspecified: Secondary | ICD-10-CM | POA: Diagnosis not present

## 2023-11-22 DIAGNOSIS — R0602 Shortness of breath: Secondary | ICD-10-CM

## 2023-11-22 DIAGNOSIS — I10 Essential (primary) hypertension: Secondary | ICD-10-CM | POA: Diagnosis not present

## 2023-11-22 DIAGNOSIS — Z794 Long term (current) use of insulin: Secondary | ICD-10-CM

## 2023-11-22 DIAGNOSIS — E1122 Type 2 diabetes mellitus with diabetic chronic kidney disease: Secondary | ICD-10-CM | POA: Diagnosis not present

## 2023-11-22 DIAGNOSIS — I5031 Acute diastolic (congestive) heart failure: Secondary | ICD-10-CM | POA: Diagnosis not present

## 2023-11-22 MED ORDER — SPIRONOLACTONE 25 MG PO TABS: 12.5000 mg | ORAL_TABLET | Freq: Every day | ORAL | 11 refills | Status: DC

## 2023-11-22 MED ORDER — DAPAGLIFLOZIN PROPANEDIOL 10 MG PO TABS
10.0000 mg | ORAL_TABLET | Freq: Every day | ORAL | 3 refills | Status: DC
Start: 2023-11-22 — End: 2023-12-10

## 2023-11-22 NOTE — Progress Notes (Signed)
 Cardiology Office Note   Date:  11/22/2023   ID:  Ernestene, Coover 02/01/1941, MRN [id number]  PCP:  Shari Daughters, MD  Cardiologist:  Debborah Fairly, MD      History of Present Illness: Tamara [name] is a 83 y.o. female who presents for  Chief Complaint  Patient presents with   Consult    Consult, echo results    82YOF presents with SOB, swelling of legs , SOB,and orthopnea/PND. CXR shows congestion, suggestive of CHF.      Past Medical History:  Diagnosis Date   Diabetes mellitus without complication (HCC)    Hypercholesteremia    Hyperlipidemia    Hypertension      Past Surgical History:  Procedure Laterality Date   APPENDECTOMY     COLONOSCOPY WITH PROPOFOL  N/A 08/09/2018   Procedure: COLONOSCOPY WITH PROPOFOL ;  Surgeon: Deveron Fly, MD;  Location: [facility] ENDOSCOPY;  Service: Endoscopy;  Laterality: N/A;   EYE SURGERY       Current Outpatient Medications  Medication Sig Dispense Refill   amLODipine -olmesartan  (AZOR ) 10-40 MG tablet Take 1 tablet by mouth daily. 90 tablet 1   aspirin  81 MG tablet Take 81 mg by mouth daily.     atenolol  (TENORMIN ) 100 MG tablet Take 1 tablet (100 mg total) by mouth daily. 90 tablet 1   atorvastatin  (LIPITOR) 20 MG tablet Take 1 tablet (20 mg total) by mouth every evening. 90 tablet 1   bifidobacterium infantis (ALIGN) capsule Take 1 capsule by mouth daily.     Cholecalciferol  25 MCG (1000 UT) tablet Take 1,000 Units by mouth daily.      dapagliflozin propanediol (FARXIGA) 10 MG TABS tablet Take 1 tablet (10 mg total) by mouth daily before breakfast. 30 tablet 3   fluticasone  (FLONASE ) 50 MCG/ACT nasal spray Place 1 spray into both nostrils daily. 16 mL 2   furosemide  (LASIX ) 40 MG tablet Take 1 tablet (40 mg total) by mouth daily for 14 days. 14 tablet 0   glucose blood (ONETOUCH ULTRA TEST) test strip Use as instructed 200 strip 2   metFORMIN  (GLUCOPHAGE ) 500 MG tablet Take 1 tablet (500 mg total) by mouth  every morning. 90 tablet 1   nitrofurantoin , macrocrystal-monohydrate, (MACROBID ) 100 MG capsule Take 1 capsule (100 mg total) by mouth daily. 90 capsule 3   spironolactone (ALDACTONE) 25 MG tablet Take 0.5 tablets (12.5 mg total) by mouth daily. 30 tablet 11   No current facility-administered medications for this visit.    Allergies:   Cefoxitin    Social History:   reports that she has never smoked. She has never been exposed to tobacco smoke. She has never used smokeless tobacco. She reports that she does not drink alcohol and does not use drugs.   Family History:  family history includes Diabetes type II in her brother; Hypertension in her brother.    ROS:     Review of Systems  Constitutional: Negative.   HENT: Negative.    Eyes: Negative.   Respiratory: Negative.    Gastrointestinal: Negative.   Genitourinary: Negative.   Musculoskeletal: Negative.   Skin: Negative.   Neurological: Negative.   Endo/Heme/Allergies: Negative.   Psychiatric/Behavioral: Negative.    All other systems reviewed and are negative.     All other systems are reviewed and negative.    PHYSICAL EXAM: VS:  BP (!) 170/80   Pulse 70   Ht 5\' 5"  (1.651 m)   Wt 164 lb 6.4 oz (  74.6 kg)   SpO2 97%   BMI 27.36 kg/m  , BMI Body mass index is 27.36 kg/m. Last weight:  Wt Readings from Last 3 Encounters:  11/22/23 164 lb 6.4 oz (74.6 kg)  11/19/23 164 lb 12.8 oz (74.8 kg)  10/22/23 157 lb (71.2 kg)     Physical Exam Constitutional:      Appearance: Normal appearance.  Cardiovascular:     Rate and Rhythm: Normal rate and regular rhythm.     Heart sounds: Normal heart sounds.  Pulmonary:     Effort: Pulmonary effort is normal.     Breath sounds: Normal breath sounds.  Musculoskeletal:     Right lower leg: No edema.     Left lower leg: No edema.  Neurological:     Mental Status: She is alert.       EKG:   Recent Labs: 03/19/2023: Hemoglobin 14.1; Platelets 337 09/17/2023: ALT 18;  BUN 10; Creatinine, Ser 0.98; Potassium 4.0; Sodium 142    Lipid Panel    Component Value Date/Time   CHOL 176 09/17/2023 1415   TRIG 100 09/17/2023 1415   HDL 90 09/17/2023 1415   CHOLHDL 2.0 09/17/2023 1415   LDLCALC 68 09/17/2023 1415      Other studies Reviewed: Additional studies/ records that were reviewed today include:  Review of the above records demonstrates:       No data to display            ASSESSMENT AND PLAN:    ICD-10-CM   1. Acute diastolic CHF (congestive heart failure) (HCC)  I50.31 dapagliflozin propanediol (FARXIGA) 10 MG TABS tablet    spironolactone (ALDACTONE) 25 MG tablet    MYOCARDIAL PERFUSION IMAGING   Has SOB, edema of legs, orthopnea, and normal EF. Add farxiga/aldactone, and stop KCL. Also r/o CAD, stress test    2. Type 2 diabetes mellitus with stage 3b chronic kidney disease, with long-term current use of insulin  (HCC)  E11.22 dapagliflozin propanediol (FARXIGA) 10 MG TABS tablet   N18.32 spironolactone (ALDACTONE) 25 MG tablet   Z79.4 MYOCARDIAL PERFUSION IMAGING    3. Essential hypertension  I10 dapagliflozin propanediol (FARXIGA) 10 MG TABS tablet    spironolactone (ALDACTONE) 25 MG tablet    MYOCARDIAL PERFUSION IMAGING   High, BP add aldactone since creat normal. Stopp KCL    4. Dyslipidemia  E78.5 dapagliflozin propanediol (FARXIGA) 10 MG TABS tablet    spironolactone (ALDACTONE) 25 MG tablet    MYOCARDIAL PERFUSION IMAGING    5. SOB (shortness of breath)  R06.02 dapagliflozin propanediol (FARXIGA) 10 MG TABS tablet    spironolactone (ALDACTONE) 25 MG tablet    MYOCARDIAL PERFUSION IMAGING    6. Nonrheumatic mitral valve regurgitation  I34.0    trace, normal EF 64%       Problem List Items Addressed This Visit       Cardiovascular and Mediastinum   Essential hypertension   Relevant Medications   dapagliflozin propanediol (FARXIGA) 10 MG TABS tablet   spironolactone (ALDACTONE) 25 MG tablet   Other Relevant Orders    MYOCARDIAL PERFUSION IMAGING     Endocrine   Type 2 diabetes mellitus with chronic kidney disease, with long-term current use of insulin  (HCC)   Relevant Medications   dapagliflozin propanediol (FARXIGA) 10 MG TABS tablet   spironolactone (ALDACTONE) 25 MG tablet   Other Relevant Orders   MYOCARDIAL PERFUSION IMAGING     Other   Dyslipidemia   Relevant Medications   dapagliflozin propanediol (FARXIGA)  10 MG TABS tablet   spironolactone  (ALDACTONE ) 25 MG tablet   Other Relevant Orders   MYOCARDIAL PERFUSION IMAGING   Other Visit Diagnoses       Acute diastolic CHF (congestive heart failure) (HCC)    -  Primary   Has SOB, edema of legs, orthopnea, and normal EF. Add farxiga /aldactone , and stop KCL. Also r/o CAD, stress test   Relevant Medications   dapagliflozin  propanediol (FARXIGA ) 10 MG TABS tablet   spironolactone  (ALDACTONE ) 25 MG tablet   Other Relevant Orders   MYOCARDIAL PERFUSION IMAGING     SOB (shortness of breath)       Relevant Medications   dapagliflozin  propanediol (FARXIGA ) 10 MG TABS tablet   spironolactone  (ALDACTONE ) 25 MG tablet   Other Relevant Orders   MYOCARDIAL PERFUSION IMAGING     Nonrheumatic mitral valve regurgitation       trace, normal EF 64%   Relevant Medications   spironolactone  (ALDACTONE ) 25 MG tablet          Disposition:   Return in about 2 weeks (around 12/06/2023) for stress test abd f/u.    Total time spent: 50 minutes  Signed,  Debborah Fairly, MD  11/22/2023 3:45 PM    [facility]

## 2023-11-23 NOTE — Telephone Encounter (Signed)
 Spoke with patient daughter informed her that we are not able to move the appt up due to the availabilty with the NST tech, I also informed her that she can come do an EKG on Tuesday at 8A if that would make her feel better since she was very rude to wanda and to me on the phone

## 2023-11-27 ENCOUNTER — Ambulatory Visit

## 2023-11-27 DIAGNOSIS — R001 Bradycardia, unspecified: Secondary | ICD-10-CM | POA: Diagnosis not present

## 2023-11-28 ENCOUNTER — Encounter: Payer: Self-pay | Admitting: Internal Medicine

## 2023-12-03 ENCOUNTER — Ambulatory Visit (INDEPENDENT_AMBULATORY_CARE_PROVIDER_SITE_OTHER)

## 2023-12-03 DIAGNOSIS — E785 Hyperlipidemia, unspecified: Secondary | ICD-10-CM

## 2023-12-03 DIAGNOSIS — Z794 Long term (current) use of insulin: Secondary | ICD-10-CM

## 2023-12-03 DIAGNOSIS — I5031 Acute diastolic (congestive) heart failure: Secondary | ICD-10-CM

## 2023-12-03 DIAGNOSIS — R0602 Shortness of breath: Secondary | ICD-10-CM | POA: Diagnosis not present

## 2023-12-03 DIAGNOSIS — E1122 Type 2 diabetes mellitus with diabetic chronic kidney disease: Secondary | ICD-10-CM | POA: Diagnosis not present

## 2023-12-03 DIAGNOSIS — I10 Essential (primary) hypertension: Secondary | ICD-10-CM

## 2023-12-04 ENCOUNTER — Encounter: Payer: Self-pay | Admitting: Cardiovascular Disease

## 2023-12-06 ENCOUNTER — Encounter

## 2023-12-06 ENCOUNTER — Encounter: Payer: Self-pay | Admitting: Cardiovascular Disease

## 2023-12-07 ENCOUNTER — Encounter (INDEPENDENT_AMBULATORY_CARE_PROVIDER_SITE_OTHER)

## 2023-12-07 ENCOUNTER — Ambulatory Visit: Admitting: Cardiovascular Disease

## 2023-12-10 ENCOUNTER — Encounter: Payer: Self-pay | Admitting: Cardiovascular Disease

## 2023-12-10 ENCOUNTER — Other Ambulatory Visit: Payer: Self-pay

## 2023-12-10 ENCOUNTER — Ambulatory Visit (INDEPENDENT_AMBULATORY_CARE_PROVIDER_SITE_OTHER): Admitting: Cardiovascular Disease

## 2023-12-10 VITALS — BP 128/62 | HR 58 | Ht 65.0 in | Wt 155.8 lb

## 2023-12-10 DIAGNOSIS — E669 Obesity, unspecified: Secondary | ICD-10-CM

## 2023-12-10 DIAGNOSIS — E1122 Type 2 diabetes mellitus with diabetic chronic kidney disease: Secondary | ICD-10-CM

## 2023-12-10 DIAGNOSIS — I340 Nonrheumatic mitral (valve) insufficiency: Secondary | ICD-10-CM | POA: Diagnosis not present

## 2023-12-10 DIAGNOSIS — I10 Essential (primary) hypertension: Secondary | ICD-10-CM

## 2023-12-10 DIAGNOSIS — N1832 Chronic kidney disease, stage 3b: Secondary | ICD-10-CM

## 2023-12-10 DIAGNOSIS — R0602 Shortness of breath: Secondary | ICD-10-CM | POA: Diagnosis not present

## 2023-12-10 DIAGNOSIS — E785 Hyperlipidemia, unspecified: Secondary | ICD-10-CM

## 2023-12-10 DIAGNOSIS — I5031 Acute diastolic (congestive) heart failure: Secondary | ICD-10-CM

## 2023-12-10 DIAGNOSIS — Z794 Long term (current) use of insulin: Secondary | ICD-10-CM

## 2023-12-10 MED ORDER — DAPAGLIFLOZIN PROPANEDIOL 10 MG PO TABS: 10.0000 mg | ORAL_TABLET | Freq: Every day | ORAL | 3 refills | Status: DC

## 2023-12-10 NOTE — Progress Notes (Signed)
 Cardiology Office Note   Date:  12/10/2023   ID:  Tamara [name], Tamara [name] July 10, 1940, MRN [id number]  PCP:  Shari Daughters, MD  Cardiologist:  Debborah Fairly, MD      History of Present Illness: Tamara [name] is a 83 y.o. female who presents for  Chief Complaint  Patient presents with   Follow-up    NST results    1 episode of wheezing and SOB      Past Medical History:  Diagnosis Date   Diabetes mellitus without complication (HCC)    Hypercholesteremia    Hyperlipidemia    Hypertension      Past Surgical History:  Procedure Laterality Date   APPENDECTOMY     COLONOSCOPY WITH PROPOFOL  N/A 08/09/2018   Procedure: COLONOSCOPY WITH PROPOFOL ;  Surgeon: Deveron Fly, MD;  Location: [facility] ENDOSCOPY;  Service: Endoscopy;  Laterality: N/A;   EYE SURGERY       Current Outpatient Medications  Medication Sig Dispense Refill   amLODipine -olmesartan  (AZOR ) 10-40 MG tablet Take 1 tablet by mouth daily. 90 tablet 1   aspirin  81 MG tablet Take 81 mg by mouth daily.     atenolol  (TENORMIN ) 100 MG tablet Take 1 tablet (100 mg total) by mouth daily. 90 tablet 1   atorvastatin  (LIPITOR) 20 MG tablet Take 1 tablet (20 mg total) by mouth every evening. 90 tablet 1   bifidobacterium infantis (ALIGN) capsule Take 1 capsule by mouth daily.     Cholecalciferol  25 MCG (1000 UT) tablet Take 1,000 Units by mouth daily.      dapagliflozin  propanediol (FARXIGA ) 10 MG TABS tablet Take 1 tablet (10 mg total) by mouth daily before breakfast. 30 tablet 3   fluticasone  (FLONASE ) 50 MCG/ACT nasal spray Place 1 spray into both nostrils daily. 16 mL 2   glucose blood (ONETOUCH ULTRA TEST) test strip Use as instructed 200 strip 2   metFORMIN  (GLUCOPHAGE ) 500 MG tablet Take 1 tablet (500 mg total) by mouth every morning. 90 tablet 1   nitrofurantoin , macrocrystal-monohydrate, (MACROBID ) 100 MG capsule Take 1 capsule (100 mg total) by mouth daily. 90 capsule 3   spironolactone  (ALDACTONE ) 25  MG tablet Take 0.5 tablets (12.5 mg total) by mouth daily. 30 tablet 11   No current facility-administered medications for this visit.    Allergies:   Cefoxitin    Social History:   reports that she has never smoked. She has never been exposed to tobacco smoke. She has never used smokeless tobacco. She reports that she does not drink alcohol and does not use drugs.   Family History:  family history includes Diabetes type II in her brother; Hypertension in her brother.    ROS:     Review of Systems  Constitutional: Negative.   HENT: Negative.    Eyes: Negative.   Respiratory: Negative.    Gastrointestinal: Negative.   Genitourinary: Negative.   Musculoskeletal: Negative.   Skin: Negative.   Neurological: Negative.   Endo/Heme/Allergies: Negative.   Psychiatric/Behavioral: Negative.    All other systems reviewed and are negative.     All other systems are reviewed and negative.    PHYSICAL EXAM: VS:  BP 128/62   Pulse (!) 58   Ht 5\' 5"  (1.651 m)   Wt 155 lb 12.8 oz (70.7 kg)   SpO2 97%   BMI 25.93 kg/m  , BMI Body mass index is 25.93 kg/m. Last weight:  Wt Readings from Last 3 Encounters:  12/10/23 155 lb  12.8 oz (70.7 kg)  11/22/23 164 lb 6.4 oz (74.6 kg)  11/19/23 164 lb 12.8 oz (74.8 kg)     Physical Exam Constitutional:      Appearance: Normal appearance.  Cardiovascular:     Rate and Rhythm: Normal rate and regular rhythm.     Heart sounds: Normal heart sounds.  Pulmonary:     Effort: Pulmonary effort is normal.     Breath sounds: Normal breath sounds.  Musculoskeletal:     Right lower leg: No edema.     Left lower leg: No edema.  Neurological:     Mental Status: She is alert.       EKG:   Recent Labs: 03/19/2023: Hemoglobin 14.1; Platelets 337 09/17/2023: ALT 18; BUN 10; Creatinine, Ser 0.98; Potassium 4.0; Sodium 142    Lipid Panel    Component Value Date/Time   CHOL 176 09/17/2023 1415   TRIG 100 09/17/2023 1415   HDL 90 09/17/2023  1415   CHOLHDL 2.0 09/17/2023 1415   LDLCALC 68 09/17/2023 1415      Other studies Reviewed: Additional studies/ records that were reviewed today include:  Review of the above records demonstrates:       No data to display            ASSESSMENT AND PLAN:    ICD-10-CM   1. Nonrheumatic mitral valve regurgitation  I34.0 dapagliflozin  propanediol (FARXIGA ) 10 MG TABS tablet   trace    2. SOB (shortness of breath)  R06.02 dapagliflozin  propanediol (FARXIGA ) 10 MG TABS tablet   lot better after farxiga , aldactone     3. Acute diastolic CHF (congestive heart failure) (HCC)  I50.31 dapagliflozin  propanediol (FARXIGA ) 10 MG TABS tablet   stress test no ischaemia    4. Essential hypertension  I10 dapagliflozin  propanediol (FARXIGA ) 10 MG TABS tablet    5. Dyslipidemia  E78.5 dapagliflozin  propanediol (FARXIGA ) 10 MG TABS tablet    6. Acute diastolic CHF (congestive heart failure) (HCC)  I50.31 dapagliflozin  propanediol (FARXIGA ) 10 MG TABS tablet   Has SOB, edema of legs, orthopnea, and normal EF. Add farxiga /aldactone , and stop KCL. Also r/o CAD, stress test    7. Type 2 diabetes mellitus with stage 3b chronic kidney disease, with long-term current use of insulin  (HCC)  E11.22 dapagliflozin  propanediol (FARXIGA ) 10 MG TABS tablet   N18.32    Z79.4     8. Essential hypertension  I10 dapagliflozin  propanediol (FARXIGA ) 10 MG TABS tablet   High, BP add aldactone  since creat normal. Stopp KCL    9. SOB (shortness of breath)  R06.02 dapagliflozin  propanediol (FARXIGA ) 10 MG TABS tablet       Problem List Items Addressed This Visit       Cardiovascular and Mediastinum   Essential hypertension   Relevant Medications   dapagliflozin  propanediol (FARXIGA ) 10 MG TABS tablet     Endocrine   Type 2 diabetes mellitus with chronic kidney disease, with long-term current use of insulin  (HCC)   Relevant Medications   dapagliflozin  propanediol (FARXIGA ) 10 MG TABS tablet     Other    Dyslipidemia   Relevant Medications   dapagliflozin  propanediol (FARXIGA ) 10 MG TABS tablet   Other Visit Diagnoses       Nonrheumatic mitral valve regurgitation    -  Primary   trace   Relevant Medications   dapagliflozin  propanediol (FARXIGA ) 10 MG TABS tablet     SOB (shortness of breath)       lot better after farxiga ,  aldactone    Relevant Medications   dapagliflozin  propanediol (FARXIGA ) 10 MG TABS tablet     Acute diastolic CHF (congestive heart failure) (HCC)       stress test no ischaemia   Relevant Medications   dapagliflozin  propanediol (FARXIGA ) 10 MG TABS tablet     Acute diastolic CHF (congestive heart failure) (HCC)       Has SOB, edema of legs, orthopnea, and normal EF. Add farxiga /aldactone , and stop KCL. Also r/o CAD, stress test   Relevant Medications   dapagliflozin  propanediol (FARXIGA ) 10 MG TABS tablet     SOB (shortness of breath)       Relevant Medications   dapagliflozin  propanediol (FARXIGA ) 10 MG TABS tablet          Disposition:   Return in about 3 months (around 03/11/2024).    Total time spent: 30 minutes  Signed,  Debborah Fairly, MD  12/10/2023 9:44 AM    [facility]

## 2023-12-21 ENCOUNTER — Encounter (INDEPENDENT_AMBULATORY_CARE_PROVIDER_SITE_OTHER)

## 2023-12-21 LAB — LIPID PANEL
Chol/HDL Ratio: 1.9 ratio (ref 0.0–4.4)
Cholesterol, Total: 164 mg/dL (ref 100–199)
HDL: 85 mg/dL (ref >39)
LDL Chol Calc (NIH): 64 mg/dL (ref 0–99)
Triglycerides: 84 mg/dL (ref 0–149)
VLDL Cholesterol Cal: 15 mg/dL (ref 5–40)

## 2023-12-21 LAB — COMPREHENSIVE METABOLIC PANEL WITH GFR
ALT: 12 IU/L (ref 0–32)
AST: 20 IU/L (ref 0–40)
Albumin: 5 g/dL — ABNORMAL HIGH (ref 3.8–4.8)
Alkaline Phosphatase: 73 IU/L (ref 44–121)
BUN/Creatinine Ratio: 15 (ref 12–28)
BUN: 19 mg/dL (ref 8–27)
Bilirubin Total: 0.6 mg/dL (ref 0.0–1.2)
CO2: 21 mmol/L (ref 20–29)
Calcium: 10.6 mg/dL — ABNORMAL HIGH (ref 8.7–10.3)
Chloride: 104 mmol/L (ref 96–106)
Creatinine, Ser: 1.28 mg/dL — ABNORMAL HIGH (ref 0.57–1.00)
Globulin, Total: 2.3 g/dL (ref 1.5–4.5)
Glucose: 132 mg/dL — ABNORMAL HIGH (ref 70–99)
Potassium: 4.8 mmol/L (ref 3.5–5.2)
Sodium: 141 mmol/L (ref 134–144)
Total Protein: 7.3 g/dL (ref 6.0–8.5)
eGFR: 42 mL/min/{1.73_m2} — ABNORMAL LOW (ref >59)

## 2023-12-21 LAB — HEMOGLOBIN A1C
Est. average glucose Bld gHb Est-mCnc: 128 mg/dL
Hgb A1c MFr Bld: 6.1 % — ABNORMAL HIGH (ref 4.8–5.6)

## 2023-12-24 ENCOUNTER — Encounter: Payer: Self-pay | Admitting: Internal Medicine

## 2023-12-24 ENCOUNTER — Ambulatory Visit (INDEPENDENT_AMBULATORY_CARE_PROVIDER_SITE_OTHER): Admitting: Internal Medicine

## 2023-12-24 ENCOUNTER — Ambulatory Visit: Payer: Self-pay | Admitting: Internal Medicine

## 2023-12-24 VITALS — BP 129/69 | HR 58 | Temp 98.4°F | Ht 65.0 in | Wt 154.0 lb

## 2023-12-24 DIAGNOSIS — Z794 Long term (current) use of insulin: Secondary | ICD-10-CM

## 2023-12-24 DIAGNOSIS — Z0130 Encounter for examination of blood pressure without abnormal findings: Secondary | ICD-10-CM | POA: Diagnosis not present

## 2023-12-24 DIAGNOSIS — N1832 Chronic kidney disease, stage 3b: Secondary | ICD-10-CM | POA: Diagnosis not present

## 2023-12-24 DIAGNOSIS — E1122 Type 2 diabetes mellitus with diabetic chronic kidney disease: Secondary | ICD-10-CM | POA: Diagnosis not present

## 2023-12-24 LAB — POCT CBG (FASTING - GLUCOSE)-MANUAL ENTRY: Glucose Fasting, POC: 117 mg/dL — AB (ref 70–99)

## 2023-12-24 NOTE — Progress Notes (Signed)
 Established Patient Office Visit  Subjective:  Patient ID: Tamara [name], female    DOB: 1940-12-29  Age: 83 y.o. MRN: [id number]  Chief Complaint  Patient presents with   Follow-up    3 month lab results    No new complaints, here for lab review and medication refills. Labs reviewed and notable for well controlled diabetes, A1c at target, lipids at target with cmp notable for deterioration in gfr from normal 3 months ago. Denies any hypoglycemic episodes and home bg readings have been at target. No further sob or orthopnea.     No other concerns at this time.   Past Medical History:  Diagnosis Date   Diabetes mellitus without complication (HCC)    Hypercholesteremia    Hyperlipidemia    Hypertension     Past Surgical History:  Procedure Laterality Date   APPENDECTOMY     COLONOSCOPY WITH PROPOFOL  N/A 08/09/2018   Procedure: COLONOSCOPY WITH PROPOFOL ;  Surgeon: Gaylyn Gladis PENNER, MD;  Location: [facility] ENDOSCOPY;  Service: Endoscopy;  Laterality: N/A;   EYE SURGERY      Social History   Socioeconomic History   Marital status: Widowed    Spouse name: Not on file   Number of children: Not on file   Years of education: Not on file   Highest education level: Not on file  Occupational History   Not on file  Tobacco Use   Smoking status: Never    Passive exposure: Never   Smokeless tobacco: Never  Vaping Use   Vaping status: Never Used  Substance and Sexual Activity   Alcohol use: No   Drug use: Never   Sexual activity: Not Currently    Birth control/protection: Post-menopausal  Other Topics Concern   Not on file  Social History Narrative   Not on file   Social Drivers of Health   Financial Resource Strain: Not on file  Food Insecurity: No Food Insecurity (06/04/2022)   Hunger Vital Sign    Worried About Running Out of Food in the Last Year: Never true    Ran Out of Food in the Last Year: Never true  Transportation Needs: No Transportation Needs  (06/04/2022)   PRAPARE - Administrator, Civil Service (Medical): No    Lack of Transportation (Non-Medical): No  Physical Activity: Not on file  Stress: Not on file  Social Connections: Unknown (09/06/2022)   [facility] from [facility]   Social Network    Social Network: Not on file  Intimate Partner Violence: Unknown (09/06/2022)   Received from [facility]   HITS    Physically Hurt: Not on file    Insult or Talk Down To: Not on file    Threaten Physical Harm: Not on file    Scream or Curse: Not on file    Family History  Problem Relation Age of Onset   Diabetes type II Brother    Hypertension Brother    Bladder Cancer Neg Hx    Kidney cancer Neg Hx    Breast cancer Neg Hx     Allergies  Allergen Reactions   Cefoxitin Diarrhea    Outpatient Medications Prior to Visit  Medication Sig   amLODipine -olmesartan  (AZOR ) 10-40 MG tablet Take 1 tablet by mouth daily.   aspirin  81 MG tablet Take 81 mg by mouth daily.   atenolol  (TENORMIN ) 100 MG tablet Take 1 tablet (100 mg total) by mouth daily.   atorvastatin  (LIPITOR) 20 MG tablet Take 1 tablet (20  mg total) by mouth every evening.   bifidobacterium infantis (ALIGN) capsule Take 1 capsule by mouth daily.   dapagliflozin  propanediol (FARXIGA ) 10 MG TABS tablet Take 1 tablet (10 mg total) by mouth daily before breakfast.   glucose blood (ONETOUCH ULTRA TEST) test strip Use as instructed   metFORMIN  (GLUCOPHAGE ) 500 MG tablet Take 1 tablet (500 mg total) by mouth every morning.   nitrofurantoin , macrocrystal-monohydrate, (MACROBID ) 100 MG capsule Take 1 capsule (100 mg total) by mouth daily.   spironolactone  (ALDACTONE ) 25 MG tablet Take 0.5 tablets (12.5 mg total) by mouth daily.   Cholecalciferol  25 MCG (1000 UT) tablet Take 1,000 Units by mouth daily.  (Patient not taking: Reported on 12/24/2023)   fluticasone  (FLONASE ) 50 MCG/ACT nasal spray Place 1 spray into both nostrils daily. (Patient not taking: Reported on  12/24/2023)   No facility-administered medications prior to visit.    Review of Systems  Constitutional: Negative.  Negative for weight loss (gained 1 lb).  HENT: Negative.    Eyes: Negative.   Respiratory:  Negative for shortness of breath.   Gastrointestinal: Negative.   Genitourinary: Negative.   Musculoskeletal:  Negative for joint pain.  Skin: Negative.   Neurological: Negative.   Endo/Heme/Allergies: Negative.   Psychiatric/Behavioral: Negative.         Objective:   BP 129/69   Pulse (!) 58   Temp 98.4 F (36.9 C)   Ht 5' 5 (1.651 m)   Wt 154 lb (69.9 kg)   SpO2 99%   BMI 25.63 kg/m   Vitals:   12/24/23 1105  BP: 129/69  Pulse: (!) 58  Temp: 98.4 F (36.9 C)  Height: 5' 5 (1.651 m)  Weight: 154 lb (69.9 kg)  SpO2: 99%  BMI (Calculated): 25.63    Physical Exam Vitals reviewed.  Constitutional:      General: She is not in acute distress. HENT:     Head: Normocephalic.     Nose: Nose normal.     Mouth/Throat:     Mouth: Mucous membranes are moist.   Eyes:     Extraocular Movements: Extraocular movements intact.     Pupils: Pupils are equal, round, and reactive to light.   Neck:     Vascular: JVD (6 cm) present.   Cardiovascular:     Rate and Rhythm: Normal rate and regular rhythm.     Heart sounds: No murmur heard. Pulmonary:     Effort: Pulmonary effort is normal.     Breath sounds: No rhonchi or rales.  Abdominal:     General: Abdomen is flat.     Palpations: There is no hepatomegaly, splenomegaly or mass.   Musculoskeletal:        General: Normal range of motion.     Cervical back: Normal range of motion. No tenderness.     Right lower leg: 1+ Edema present.     Left lower leg: 1+ Edema present.   Skin:    General: Skin is warm and dry.   Neurological:     General: No focal deficit present.     Mental Status: She is alert and oriented to person, place, and time.     Cranial Nerves: No cranial nerve deficit.     Motor: No  weakness.     Gait: Gait abnormal.   Psychiatric:        Mood and Affect: Mood normal.        Behavior: Behavior normal.      Results for orders  placed or performed in visit on 12/24/23  POCT CBG (Fasting - Glucose)  Result Value Ref Range   Glucose Fasting, POC 117 (A) 70 - 99 mg/dL    Recent Results (from the past 2160 hours)  Urinalysis, Complete     Status: Abnormal   Collection Time: 10/22/23 10:12 AM  Result Value Ref Range   Specific Gravity, UA 1.025 1.005 - 1.030   pH, UA 6.0 5.0 - 7.5   Color, UA Yellow Yellow   Appearance Ur Clear Clear   Leukocytes,UA Negative Negative   Protein,UA 2+ (A) Negative/Trace   Glucose, UA Negative Negative   Ketones, UA Negative Negative   RBC, UA Trace (A) Negative   Bilirubin, UA Negative Negative   Urobilinogen, Ur 0.2 0.2 - 1.0 mg/dL   Nitrite, UA Negative Negative   Microscopic Examination See below:   Microscopic Examination     Status: Abnormal   Collection Time: 10/22/23 10:12 AM   Urine  Result Value Ref Range   WBC, UA 0-5 0 - 5 /hpf   RBC, Urine 3-10 (A) 0 - 2 /hpf   Epithelial Cells (non renal) >10 (A) 0 - 10 /hpf   Mucus, UA Present (A) Not Estab.   Bacteria, UA Few None seen/Few  CULTURE, URINE COMPREHENSIVE     Status: None   Collection Time: 10/22/23 10:21 AM   Specimen: Urine   UR  Result Value Ref Range   Urine Culture, Comprehensive Final report    Organism ID, Bacteria Comment     Comment: Mixed urogenital flora 10,000-25,000 colony forming units per mL   POCT CBG (Fasting - Glucose)     Status: Normal   Collection Time: 11/19/23  1:43 PM  Result Value Ref Range   Glucose Fasting, POC 95 70 - 99 mg/dL  Comprehensive metabolic panel     Status: Abnormal   Collection Time: 12/20/23  8:53 AM  Result Value Ref Range   Glucose 132 (H) 70 - 99 mg/dL   BUN 19 8 - 27 mg/dL   Creatinine, Ser 8.71 (H) 0.57 - 1.00 mg/dL   eGFR 42 (L) >40 fO/fpw/8.26   BUN/Creatinine Ratio 15 12 - 28   Sodium 141 134 -  144 mmol/L   Potassium 4.8 3.5 - 5.2 mmol/L   Chloride 104 96 - 106 mmol/L   CO2 21 20 - 29 mmol/L   Calcium  10.6 (H) 8.7 - 10.3 mg/dL   Total Protein 7.3 6.0 - 8.5 g/dL   Albumin 5.0 (H) 3.8 - 4.8 g/dL   Globulin, Total 2.3 1.5 - 4.5 g/dL   Bilirubin Total 0.6 0.0 - 1.2 mg/dL   Alkaline Phosphatase 73 44 - 121 IU/L   AST 20 0 - 40 IU/L   ALT 12 0 - 32 IU/L  Lipid panel     Status: None   Collection Time: 12/20/23  8:53 AM  Result Value Ref Range   Cholesterol, Total 164 100 - 199 mg/dL   Triglycerides 84 0 - 149 mg/dL   HDL 85 >60 mg/dL   VLDL Cholesterol Cal 15 5 - 40 mg/dL   LDL Chol Calc (NIH) 64 0 - 99 mg/dL   Chol/HDL Ratio 1.9 0.0 - 4.4 ratio    Comment:                                   T. Chol/HDL Ratio  Men  Women                               1/2 Avg.Risk  3.4    3.3                                   Avg.Risk  5.0    4.4                                2X Avg.Risk  9.6    7.1                                3X Avg.Risk 23.4   11.0   Hemoglobin A1c     Status: Abnormal   Collection Time: 12/20/23  8:53 AM  Result Value Ref Range   Hgb A1c MFr Bld 6.1 (H) 4.8 - 5.6 %    Comment:          Prediabetes: 5.7 - 6.4          Diabetes: >6.4          Glycemic control for adults with diabetes: <7.0    Est. average glucose Bld gHb Est-mCnc 128 mg/dL  POCT CBG (Fasting - Glucose)     Status: Abnormal   Collection Time: 12/24/23 11:16 AM  Result Value Ref Range   Glucose Fasting, POC 117 (A) 70 - 99 mg/dL      Assessment & Plan:  As per problem list. The current medical regimen is effective;  continue present plan and medications. Increase fluid intake to 1.5 L/day   Problem List Items Addressed This Visit       Endocrine   Type 2 diabetes mellitus with chronic kidney disease, with long-term current use of insulin  (HCC) - Primary   Relevant Orders   POCT CBG (Fasting - Glucose) (Completed)    Return in about 3 months  (around 03/25/2024) for awv with labs prior.   Total time spent: 20 minutes  Sherrill Cinderella Perry, MD  12/24/2023   This document may have been prepared by [facility] Voice Recognition software and as such may include unintentional dictation errors.

## 2023-12-28 ENCOUNTER — Other Ambulatory Visit: Payer: Self-pay | Admitting: Internal Medicine

## 2023-12-28 DIAGNOSIS — Z1231 Encounter for screening mammogram for malignant neoplasm of breast: Secondary | ICD-10-CM

## 2024-01-21 ENCOUNTER — Other Ambulatory Visit: Admitting: Urology

## 2024-01-24 ENCOUNTER — Emergency Department
Admission: EM | Admit: 2024-01-24 | Discharge: 2024-01-24 | Disposition: A | Discharge status: Home / Self Care | Attending: Emergency Medicine | Admitting: Emergency Medicine

## 2024-01-24 ENCOUNTER — Other Ambulatory Visit: Payer: Self-pay

## 2024-01-24 ENCOUNTER — Emergency Department

## 2024-01-24 DIAGNOSIS — E119 Type 2 diabetes mellitus without complications: Secondary | ICD-10-CM | POA: Diagnosis not present

## 2024-01-24 DIAGNOSIS — M25551 Pain in right hip: Secondary | ICD-10-CM | POA: Insufficient documentation

## 2024-01-24 DIAGNOSIS — I10 Essential (primary) hypertension: Secondary | ICD-10-CM | POA: Diagnosis not present

## 2024-01-24 MED ORDER — DICLOFENAC SOD-LIDOCAINE HCL 1-4.5 % EX GEL: 1.0000 | Freq: Two times a day (BID) | CUTANEOUS | 0 refills | Status: DC | PRN

## 2024-01-24 NOTE — ED Triage Notes (Signed)
 Pt states R hip, no injury noted, just hurts to walk.

## 2024-01-24 NOTE — ED Notes (Signed)
 See triage note  Presents with pain to right hip area  States she woke up with this pain this am   Denies any injury

## 2024-01-24 NOTE — ED Provider Notes (Signed)
 [facility] Provider Note    Event Date/Time   First MD Initiated Contact with Patient 01/24/24 1045     (approximate)   History   Hip Pain   HPI  Tamara [name] is a 83 y.o. female with history of hypertension, type 2 diabetes, hypokalemia, hyperlipidemia and as listed in EMR presents to the emergency department for treatment and evaluation of nontraumatic right hip pain.  Pain is worse with attempt to bear weight.  Pain was present upon awakening this morning.  She had gotten up in the night and did not have any pain.  Initially, pain radiated down into the back of her leg but that has since stopped.     Physical Exam    Vitals:   01/24/24 1017  BP: (!) 156/88  Pulse: 63  Resp: 18  Temp: 97.6 F (36.4 C)  SpO2: 100%    General: Awake, no distress.  CV:  Good peripheral perfusion.  Resp:  Normal effort.  Abd:  No distention.  Other:  Demonstrates full flexion and extension of the right knee and hip.  No pain with internal or external rotation.  No shortening or rotation.  Dorsalis pedis pulses are equal.  Pain is in the right pubis area.  No groin pain.   ED Results / Procedures / Treatments   Labs (all labs ordered are listed, but only abnormal results are displayed)  Labs Reviewed - No data to display   EKG  Not indicated   RADIOLOGY  Image and radiology report reviewed and interpreted by me. Radiology report consistent with the same.  X-ray of the right hip negative for acute concerns.  PROCEDURES:  Critical Care performed: No  Procedures   MEDICATIONS ORDERED IN ED:  Medications - No data to display   IMPRESSION / MDM / ASSESSMENT AND PLAN / ED COURSE   I have reviewed the triage note and vital signs. Vital signs are stable   Differential diagnosis includes, but is not limited to, hip strain, hip contusion, hip fracture, osteoarthritis  Patient's presentation is most consistent with acute illness / injury  with system symptoms.  83 year old female presenting to the emergency department for treatment and evaluation of sudden onset right side hip pain that was present upon awakening this morning.  See HPI for further details.  Exam is reassuring.  The pain is focal right pubis area.  No pain with flexion, internal, or external rotation of the hip.  Distal pulses are equal.  Skin is warm and dry.  There is no contusion or abrasion.  X-ray shows no acute concerns.  Patient able to transition from bed to wheelchair without assistance.  She is noted to ambulate with a steady gait.  Plan will be to discharge her home with a prescription for diclofenac  gel to be applied 2 times per day if needed for pain.  She is to follow-up with her primary care provider if not improving over the next few days.      FINAL CLINICAL IMPRESSION(S) / ED DIAGNOSES   Final diagnoses:  Acute pain of right hip     Rx / DC Orders   ED Discharge Orders          Ordered    Diclofenac  Sod-Lidocaine  HCl 1-4.5 % GEL  2 times daily PRN        01/24/24 1337             Note:  This document was prepared using Dragon voice  recognition software and may include unintentional dictation errors.   Herlinda Kirk NOVAK, FNP 01/24/24 1426    Ernest Ronal BRAVO, MD 01/24/24 [phone]

## 2024-01-28 ENCOUNTER — Encounter

## 2024-01-28 ENCOUNTER — Other Ambulatory Visit: Admitting: Urology

## 2024-01-30 ENCOUNTER — Ambulatory Visit
Admission: RE | Admit: 2024-01-30 | Discharge: 2024-01-30 | Disposition: A | Discharge status: Home / Self Care | Source: Ambulatory Visit | Attending: Internal Medicine | Admitting: Internal Medicine

## 2024-01-30 DIAGNOSIS — Z1231 Encounter for screening mammogram for malignant neoplasm of breast: Secondary | ICD-10-CM | POA: Diagnosis present

## 2024-02-12 ENCOUNTER — Ambulatory Visit: Admitting: Podiatry

## 2024-02-19 ENCOUNTER — Ambulatory Visit: Admitting: Podiatry

## 2024-02-22 ENCOUNTER — Encounter: Payer: Self-pay | Admitting: Podiatry

## 2024-02-22 ENCOUNTER — Ambulatory Visit (INDEPENDENT_AMBULATORY_CARE_PROVIDER_SITE_OTHER): Admitting: Podiatry

## 2024-02-22 VITALS — Ht 65.0 in | Wt 152.1 lb

## 2024-02-22 DIAGNOSIS — M79674 Pain in right toe(s): Secondary | ICD-10-CM

## 2024-02-22 DIAGNOSIS — M79675 Pain in left toe(s): Secondary | ICD-10-CM | POA: Diagnosis not present

## 2024-02-22 DIAGNOSIS — B351 Tinea unguium: Secondary | ICD-10-CM | POA: Diagnosis not present

## 2024-02-22 NOTE — Progress Notes (Signed)
   Chief Complaint  Patient presents with   Nail Problem    Pt is here for Tamara [name].    SUBJECTIVE Patient with a history of diabetes mellitus presents to office today complaining of elongated, thickened nails that cause pain while ambulating in shoes.  Patient is unable to trim their own nails.  She also has noticed significant swelling to the bilateral lower extremities over the last 4-5 months with discoloration of the skin noted to the feet bilateral.  Patient is here for further evaluation and treatment.  Past Medical History:  Diagnosis Date   Diabetes mellitus without complication (HCC)    Hypercholesteremia    Hyperlipidemia    Hypertension     Allergies  Allergen Reactions   Cefoxitin Diarrhea     OBJECTIVE General Patient is awake, alert, and oriented x 3 and in no acute distress. Derm Skin is dry and supple bilateral. Negative open lesions or macerations. Remaining integument unremarkable. Nails are tender, long, thickened and dystrophic with subungual debris, consistent with onychomycosis, 1-5 bilateral. No signs of infection noted. Vasc heavy edema noted bilateral lower extremities with venous insufficiency and skin discoloration to the bilateral feet Neuro light touch and protective threshold sensation diminished bilaterally.  Musculoskeletal Exam No symptomatic pedal deformities noted bilateral.  No pain  ASSESSMENT 1. Diabetes Mellitus w/ peripheral neuropathy 2.  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  Patient has a pair that she currently does not wear.  Her diuretic medication was also adjusted towards the beginning of the new year.  This is when the edema began.  She has a follow-up appointment with her PCP next month.  Recommend discussing -Order placed for ABIs bilateral lower extremities to establish vascular baseline -Mechanical  debridement of nails 1-5 bilateral was performed using a nail nipper without incident or bleeding.  Smoothed with a rotary bur -Return to clinic 3 months routine footcare    Tamara [name], DPM [facility]  Dr. Thresa EMERSON [name], DPM    [address] [place], [place] [address]                Office [phone]  Fax [phone]

## 2024-03-17 ENCOUNTER — Ambulatory Visit (INDEPENDENT_AMBULATORY_CARE_PROVIDER_SITE_OTHER): Admitting: Cardiovascular Disease

## 2024-03-17 ENCOUNTER — Encounter: Payer: Self-pay | Admitting: Cardiovascular Disease

## 2024-03-17 VITALS — BP 104/60 | HR 58 | Ht 65.0 in | Wt 150.2 lb

## 2024-03-17 DIAGNOSIS — I5031 Acute diastolic (congestive) heart failure: Secondary | ICD-10-CM | POA: Diagnosis not present

## 2024-03-17 DIAGNOSIS — I10 Essential (primary) hypertension: Secondary | ICD-10-CM | POA: Diagnosis not present

## 2024-03-17 DIAGNOSIS — I340 Nonrheumatic mitral (valve) insufficiency: Secondary | ICD-10-CM

## 2024-03-17 DIAGNOSIS — R0602 Shortness of breath: Secondary | ICD-10-CM | POA: Diagnosis not present

## 2024-03-17 LAB — HEMOGLOBIN A1C
Est. average glucose Bld gHb Est-mCnc: 134 mg/dL
Hgb A1c MFr Bld: 6.3 % — ABNORMAL HIGH (ref 4.8–5.6)

## 2024-03-17 NOTE — Progress Notes (Addendum)
 Cardiology Office Note   Date:  03/17/2024   ID:  Thuy, Atilano Nov 10, 1940, MRN [id number]  PCP:  Albina GORMAN Dine, MD  Cardiologist:  Denyse Bathe, MD      History of Present Illness: Tamara [name] is a 83 y.o. female who presents for  Chief Complaint  Patient presents with   Follow-up    3 months follow up    No dizziness or SOB.      Past Medical History:  Diagnosis Date   Diabetes mellitus without complication (HCC)    Hypercholesteremia    Hyperlipidemia    Hypertension      Past Surgical History:  Procedure Laterality Date   APPENDECTOMY     COLONOSCOPY WITH PROPOFOL  N/A 08/09/2018   Procedure: COLONOSCOPY WITH PROPOFOL ;  Surgeon: Gaylyn Gladis PENNER, MD;  Location: [facility] ENDOSCOPY;  Service: Endoscopy;  Laterality: N/A;   EYE SURGERY       Current Outpatient Medications  Medication Sig Dispense Refill   amLODipine -olmesartan  (AZOR ) 10-40 MG tablet Take 1 tablet by mouth daily. 90 tablet 1   aspirin  81 MG tablet Take 81 mg by mouth daily.     atenolol  (TENORMIN ) 100 MG tablet Take 1 tablet (100 mg total) by mouth daily. 90 tablet 1   atorvastatin  (LIPITOR) 20 MG tablet Take 1 tablet (20 mg total) by mouth every evening. 90 tablet 1   bifidobacterium infantis (ALIGN) capsule Take 1 capsule by mouth daily.     Cholecalciferol  25 MCG (1000 UT) tablet Take 1,000 Units by mouth daily.      dapagliflozin  propanediol (FARXIGA ) 10 MG TABS tablet Take 1 tablet (10 mg total) by mouth daily before breakfast. 30 tablet 3   Diclofenac  Sod-Lidocaine  HCl 1-4.5 % GEL Apply 1 Application topically 2 (two) times daily as needed. 99 g 0   fluticasone  (FLONASE ) 50 MCG/ACT nasal spray Place 1 spray into both nostrils daily. 16 mL 2   glucose blood (ONETOUCH ULTRA TEST) test strip Use as instructed 200 strip 2   metFORMIN  (GLUCOPHAGE ) 500 MG tablet Take 1 tablet (500 mg total) by mouth every morning. 90 tablet 1   nitrofurantoin , macrocrystal-monohydrate,  (MACROBID ) 100 MG capsule Take 1 capsule (100 mg total) by mouth daily. 90 capsule 3   spironolactone  (ALDACTONE ) 25 MG tablet Take 0.5 tablets (12.5 mg total) by mouth daily. 30 tablet 11   No current facility-administered medications for this visit.    Allergies:   Cefoxitin    Social History:   reports that she has never smoked. She has never been exposed to tobacco smoke. She has never used smokeless tobacco. She reports that she does not drink alcohol and does not use drugs.   Family History:  family history includes Diabetes type II in her brother; Hypertension in her brother.    ROS:     Review of Systems  Constitutional: Negative.   HENT: Negative.    Eyes: Negative.   Respiratory: Negative.    Gastrointestinal: Negative.   Genitourinary: Negative.   Musculoskeletal: Negative.   Skin: Negative.   Neurological: Negative.   Endo/Heme/Allergies: Negative.   Psychiatric/Behavioral: Negative.    All other systems reviewed and are negative.     All other systems are reviewed and negative.    PHYSICAL EXAM: VS:  BP 104/60   Pulse (!) 58   Ht 5' 5 (1.651 m)   Wt 150 lb 3.2 oz (68.1 kg)   SpO2 95%   BMI 24.99 kg/m  ,  BMI Body mass index is 24.99 kg/m. Last weight:  Wt Readings from Last 3 Encounters:  03/17/24 150 lb 3.2 oz (68.1 kg)  02/22/24 152 lb 1.9 oz (69 kg)  01/24/24 152 lb 1.9 oz (69 kg)     Physical Exam Constitutional:      Appearance: Normal appearance.  Cardiovascular:     Rate and Rhythm: Normal rate and regular rhythm.     Heart sounds: Normal heart sounds.  Pulmonary:     Effort: Pulmonary effort is normal.     Breath sounds: Normal breath sounds.  Musculoskeletal:     Right lower leg: No edema.     Left lower leg: No edema.  Neurological:     Mental Status: She is alert.       EKG:   Recent Labs: 03/19/2023: Hemoglobin 14.1; Platelets 337 12/20/2023: ALT 12; BUN 19; Creatinine, Ser 1.28; Potassium 4.8; Sodium 141    Lipid  Panel    Component Value Date/Time   CHOL 164 12/20/2023 0853   TRIG 84 12/20/2023 0853   HDL 85 12/20/2023 0853   CHOLHDL 1.9 12/20/2023 0853   LDLCALC 64 12/20/2023 0853      Other studies Reviewed: Additional studies/ records that were reviewed today include:  Review of the above records demonstrates:       No data to display            ASSESSMENT AND PLAN:    ICD-10-CM   1. Essential hypertension  I10     2. Nonrheumatic mitral valve regurgitation  I34.0     3. SOB (shortness of breath)  R06.02     4. Acute diastolic CHF (congestive heart failure) (HCC)  I50.31    On farxiga , breathing better.       Problem List Items Addressed This Visit       Cardiovascular and Mediastinum   Essential hypertension - Primary   Other Visit Diagnoses       Nonrheumatic mitral valve regurgitation         SOB (shortness of breath)         Acute diastolic CHF (congestive heart failure) (HCC)       On farxiga , breathing better.          Disposition:   Return in about 3 months (around 06/16/2024).    Total time spent: 30 minutes  Signed,  Denyse Bathe, MD  03/17/2024 10:18 AM    [facility]

## 2024-03-18 LAB — LIPID PANEL
Chol/HDL Ratio: 2 ratio (ref 0.0–4.4)
Cholesterol, Total: 174 mg/dL (ref 100–199)
HDL: 85 mg/dL (ref >39)
LDL Chol Calc (NIH): 76 mg/dL (ref 0–99)
Triglycerides: 70 mg/dL (ref 0–149)
VLDL Cholesterol Cal: 13 mg/dL (ref 5–40)

## 2024-03-24 ENCOUNTER — Ambulatory Visit (INDEPENDENT_AMBULATORY_CARE_PROVIDER_SITE_OTHER): Admitting: Internal Medicine

## 2024-03-24 VITALS — BP 144/82 | HR 58 | Temp 97.9°F | Ht 65.0 in | Wt 153.2 lb

## 2024-03-24 DIAGNOSIS — E785 Hyperlipidemia, unspecified: Secondary | ICD-10-CM | POA: Diagnosis not present

## 2024-03-24 DIAGNOSIS — E1122 Type 2 diabetes mellitus with diabetic chronic kidney disease: Secondary | ICD-10-CM | POA: Diagnosis not present

## 2024-03-24 DIAGNOSIS — I10 Essential (primary) hypertension: Secondary | ICD-10-CM | POA: Diagnosis not present

## 2024-03-24 DIAGNOSIS — Z0001 Encounter for general adult medical examination with abnormal findings: Secondary | ICD-10-CM

## 2024-03-24 DIAGNOSIS — E669 Obesity, unspecified: Secondary | ICD-10-CM

## 2024-03-24 DIAGNOSIS — N1832 Chronic kidney disease, stage 3b: Secondary | ICD-10-CM

## 2024-03-24 DIAGNOSIS — Z794 Long term (current) use of insulin: Secondary | ICD-10-CM

## 2024-03-24 DIAGNOSIS — G3184 Mild cognitive impairment, so stated: Secondary | ICD-10-CM

## 2024-03-24 LAB — POCT CBG (FASTING - GLUCOSE)-MANUAL ENTRY: Glucose Fasting, POC: 116 mg/dL — AB (ref 70–99)

## 2024-03-24 NOTE — Progress Notes (Signed)
 Established Patient Office Visit  Subjective:  Patient ID: Tamara [name], female    DOB: [birth year]  Age: 83 y.o. MRN: [id number]  Chief Complaint  Patient presents with   Annual Exam    3 month lab results, AWV    No new complaints, here for AWV refer to quality metrics and scanned documents.   Also here for lab review and medication refills. Labs reviewed and notable for well controlled diabetes, A1c at target, lipids at target with unremarkable cmp. Denies any hypoglycemic episodes and home bg readings have been at target. She didn't take her antihypertensives this am.    No other concerns at this time.   Past Medical History:  Diagnosis Date   Diabetes mellitus without complication (HCC)    Hypercholesteremia    Hyperlipidemia    Hypertension     Past Surgical History:  Procedure Laterality Date   APPENDECTOMY     COLONOSCOPY WITH PROPOFOL  N/A 08/09/2018   Procedure: COLONOSCOPY WITH PROPOFOL ;  Surgeon: Gaylyn Gladis PENNER, MD;  Location: [facility] ENDOSCOPY;  Service: Endoscopy;  Laterality: N/A;   EYE SURGERY      Social History   Socioeconomic History   Marital status: Widowed    Spouse name: Not on file   Number of children: Not on file   Years of education: Not on file   Highest education level: Not on file  Occupational History   Not on file  Tobacco Use   Smoking status: Never    Passive exposure: Never   Smokeless tobacco: Never  Vaping Use   Vaping status: Never Used  Substance and Sexual Activity   Alcohol use: No   Drug use: Never   Sexual activity: Not Currently    Birth control/protection: Post-menopausal  Other Topics Concern   Not on file  Social History Narrative   Not on file   Social Drivers of Health   Financial Resource Strain: Not on file  Food Insecurity: No Food Insecurity (06/04/2022)   Hunger Vital Sign    Worried About Running Out of Food in the Last Year: Never true    Ran Out of Food in the Last Year: Never true   Transportation Needs: No Transportation Needs (06/04/2022)   PRAPARE - Administrator, Civil Service (Medical): No    Lack of Transportation (Non-Medical): No  Physical Activity: Not on file  Stress: Not on file  Social Connections: Unknown (09/06/2022)   Received from [facility]   Social Network    Social Network: Not on file  Intimate Partner Violence: Unknown (09/06/2022)   Received from [facility]   HITS    Physically Hurt: Not on file    Insult or Talk Down To: Not on file    Threaten Physical Harm: Not on file    Scream or Curse: Not on file    Family History  Problem Relation Age of Onset   Diabetes type II Brother    Hypertension Brother    Bladder Cancer Neg Hx    Kidney cancer Neg Hx    Breast cancer Neg Hx     Allergies  Allergen Reactions   Cefoxitin Diarrhea    Outpatient Medications Prior to Visit  Medication Sig   amLODipine -olmesartan  (AZOR ) 10-40 MG tablet Take 1 tablet by mouth daily.   aspirin  81 MG tablet Take 81 mg by mouth daily.   atenolol  (TENORMIN ) 100 MG tablet Take 1 tablet (100 mg total) by mouth daily.   atorvastatin  (LIPITOR)  20 MG tablet Take 1 tablet (20 mg total) by mouth every evening.   bifidobacterium infantis (ALIGN) capsule Take 1 capsule by mouth daily.   Cholecalciferol  25 MCG (1000 UT) tablet Take 1,000 Units by mouth daily.    dapagliflozin  propanediol (FARXIGA ) 10 MG TABS tablet Take 1 tablet (10 mg total) by mouth daily before breakfast.   Diclofenac  Sod-Lidocaine  HCl 1-4.5 % GEL Apply 1 Application topically 2 (two) times daily as needed.   glucose blood (ONETOUCH ULTRA TEST) test strip Use as instructed   metFORMIN  (GLUCOPHAGE ) 500 MG tablet Take 1 tablet (500 mg total) by mouth every morning.   Multiple Vitamin (MULTIVITAMIN) tablet Take 1 tablet by mouth daily.   Multiple Vitamins-Minerals (B COMPLEX-C-E-ZINC ) tablet Take 1 tablet by mouth daily.   nitrofurantoin , macrocrystal-monohydrate, (MACROBID ) 100 MG  capsule Take 1 capsule (100 mg total) by mouth daily.   spironolactone  (ALDACTONE ) 25 MG tablet Take 0.5 tablets (12.5 mg total) by mouth daily.   fluticasone  (FLONASE ) 50 MCG/ACT nasal spray Place 1 spray into both nostrils daily. (Patient not taking: Reported on 03/24/2024)   No facility-administered medications prior to visit.    Review of Systems  Constitutional: Negative.  Weight loss: gained 1 lb.  HENT: Negative.    Eyes: Negative.   Respiratory:  Negative for shortness of breath.   Gastrointestinal: Negative.   Genitourinary: Negative.   Musculoskeletal:  Negative for joint pain.  Skin: Negative.   Neurological: Negative.   Endo/Heme/Allergies: Negative.   Psychiatric/Behavioral: Negative.         Objective:   BP (!) 144/82 (Cuff Size: Normal)   Pulse (!) 58   Temp 97.9 F (36.6 C)   Ht 5' 5 (1.651 m)   Wt 153 lb 3.2 oz (69.5 kg)   SpO2 98%   BMI 25.49 kg/m   Vitals:   03/24/24 1019 03/24/24 1051  BP: (!) 144/88 (!) 144/82  Pulse: (!) 58   Temp: 97.9 F (36.6 C)   Height: 5' 5 (1.651 m)   Weight: 153 lb 3.2 oz (69.5 kg)   SpO2: 98%   BMI (Calculated): 25.49     Physical Exam Vitals reviewed.  Constitutional:      General: She is not in acute distress. HENT:     Head: Normocephalic.     Nose: Nose normal.     Mouth/Throat:     Mouth: Mucous membranes are moist.  Eyes:     Extraocular Movements: Extraocular movements intact.     Pupils: Pupils are equal, round, and reactive to light.  Neck:     Vascular: No JVD.  Cardiovascular:     Rate and Rhythm: Normal rate and regular rhythm.     Heart sounds: No murmur heard. Pulmonary:     Effort: Pulmonary effort is normal.     Breath sounds: No rhonchi or rales.  Abdominal:     General: Abdomen is flat.     Palpations: There is no hepatomegaly, splenomegaly or mass.  Musculoskeletal:        General: Normal range of motion.     Cervical back: Normal range of motion. No tenderness.     Right lower  leg: 1+ Edema present.     Left lower leg: 1+ Edema present.  Skin:    General: Skin is warm and dry.  Neurological:     General: No focal deficit present.     Mental Status: She is alert and oriented to person, place, and time.  Cranial Nerves: No cranial nerve deficit.     Motor: No weakness.     Gait: Gait abnormal.  Psychiatric:        Mood and Affect: Mood normal.        Behavior: Behavior normal.      Results for orders placed or performed in visit on 03/24/24  POCT CBG (Fasting - Glucose)  Result Value Ref Range   Glucose Fasting, POC 116 (A) 70 - 99 mg/dL    Recent Results (from the past 2160 hours)  Hemoglobin A1c     Status: Abnormal   Collection Time: 03/17/24 10:32 AM  Result Value Ref Range   Hgb A1c MFr Bld 6.3 (H) 4.8 - 5.6 %    Comment:          Prediabetes: 5.7 - 6.4          Diabetes: >6.4          Glycemic control for adults with diabetes: <7.0    Est. average glucose Bld gHb Est-mCnc 134 mg/dL  Lipid panel     Status: None   Collection Time: 03/17/24 10:35 AM  Result Value Ref Range   Cholesterol, Total 174 100 - 199 mg/dL   Triglycerides 70 0 - 149 mg/dL   HDL 85 >60 mg/dL   VLDL Cholesterol Cal 13 5 - 40 mg/dL   LDL Chol Calc (NIH) 76 0 - 99 mg/dL   Chol/HDL Ratio 2.0 0.0 - 4.4 ratio    Comment:                                   T. Chol/HDL Ratio                                             Men  Women                               1/2 Avg.Risk  3.4    3.3                                   Avg.Risk  5.0    4.4                                2X Avg.Risk  9.6    7.1                                3X Avg.Risk 23.4   11.0   POCT CBG (Fasting - Glucose)     Status: Abnormal   Collection Time: 03/24/24 10:26 AM  Result Value Ref Range   Glucose Fasting, POC 116 (A) 70 - 99 mg/dL      Assessment & Plan:  Ester was seen today for annual exam.  Type 2 diabetes mellitus with stage 3b chronic kidney disease, with long-term current use of insulin   (HCC) -     POCT CBG (Fasting - Glucose) -     POC CREATINE & ALBUMIN,URINE; Future -     Hemoglobin A1c  Mild cognitive impairment  Essential  hypertension -     Lipid panel -     CMP14+EGFR -     TSH -     Hemoglobin A1c  Dyslipidemia  Simple obesity -     Lipid panel -     CMP14+EGFR -     TSH -     Hemoglobin A1c    Problem List Items Addressed This Visit       Cardiovascular and Mediastinum   Essential hypertension     Endocrine   Type 2 diabetes mellitus with chronic kidney disease, with long-term current use of insulin  (HCC) - Primary   Relevant Orders   POCT CBG (Fasting - Glucose) (Completed)   POC CREATINE & ALBUMIN,URINE     Other   Dyslipidemia   Other Visit Diagnoses       Mild cognitive impairment         Simple obesity           Problem List Items Addressed This Visit       Cardiovascular and Mediastinum   Essential hypertension     Endocrine   Type 2 diabetes mellitus with chronic kidney disease, with long-term current use of insulin  (HCC) - Primary   Relevant Orders   POCT CBG (Fasting - Glucose) (Completed)   POC CREATINE & ALBUMIN,URINE     Other   Dyslipidemia   Other Visit Diagnoses       Mild cognitive impairment         Simple obesity           Return in 3 weeks (on 04/14/2024) for memory eval.   Total time spent: 30 minutes  Sherrill Cinderella Perry, MD  03/24/2024   This document may have been prepared by [facility] Voice Recognition software and as such may include unintentional dictation errors.

## 2024-04-14 ENCOUNTER — Ambulatory Visit (INDEPENDENT_AMBULATORY_CARE_PROVIDER_SITE_OTHER): Admitting: Internal Medicine

## 2024-04-14 ENCOUNTER — Other Ambulatory Visit: Admitting: Urology

## 2024-04-14 VITALS — BP 110/74 | HR 65 | Ht 65.0 in | Wt 151.4 lb

## 2024-04-14 DIAGNOSIS — G3184 Mild cognitive impairment, so stated: Secondary | ICD-10-CM

## 2024-04-14 DIAGNOSIS — E785 Hyperlipidemia, unspecified: Secondary | ICD-10-CM | POA: Diagnosis not present

## 2024-04-14 DIAGNOSIS — N1832 Chronic kidney disease, stage 3b: Secondary | ICD-10-CM

## 2024-04-14 DIAGNOSIS — Z0130 Encounter for examination of blood pressure without abnormal findings: Secondary | ICD-10-CM

## 2024-04-14 DIAGNOSIS — E1122 Type 2 diabetes mellitus with diabetic chronic kidney disease: Secondary | ICD-10-CM

## 2024-04-14 DIAGNOSIS — Z794 Long term (current) use of insulin: Secondary | ICD-10-CM

## 2024-04-14 LAB — POCT CBG (FASTING - GLUCOSE)-MANUAL ENTRY: Glucose Fasting, POC: 232 mg/dL — AB (ref 70–99)

## 2024-04-14 NOTE — Progress Notes (Signed)
 Established Patient Office Visit  Subjective:  Patient ID: Tamara [name], female    DOB: [birth year]  Age: 83 y.o. MRN: [id number]  Chief Complaint  Patient presents with   Follow-up    Memory evaluation    No new complaints here for MMSE, refer to scanned documents.    No other concerns at this time.   Past Medical History:  Diagnosis Date   Diabetes mellitus without complication (HCC)    Hypercholesteremia    Hyperlipidemia    Hypertension     Past Surgical History:  Procedure Laterality Date   APPENDECTOMY     COLONOSCOPY WITH PROPOFOL  N/A 08/09/2018   Procedure: COLONOSCOPY WITH PROPOFOL ;  Surgeon: Gaylyn Gladis PENNER, MD;  Location: [facility] ENDOSCOPY;  Service: Endoscopy;  Laterality: N/A;   EYE SURGERY      Social History   Socioeconomic History   Marital status: Widowed    Spouse name: Not on file   Number of children: Not on file   Years of education: Not on file   Highest education level: Not on file  Occupational History   Not on file  Tobacco Use   Smoking status: Never    Passive exposure: Never   Smokeless tobacco: Never  Vaping Use   Vaping status: Never Used  Substance and Sexual Activity   Alcohol use: No   Drug use: Never   Sexual activity: Not Currently    Birth control/protection: Post-menopausal  Other Topics Concern   Not on file  Social History Narrative   Not on file   Social Drivers of Health   Financial Resource Strain: Not on file  Food Insecurity: No Food Insecurity (06/04/2022)   Hunger Vital Sign    Worried About Running Out of Food in the Last Year: Never true    Ran Out of Food in the Last Year: Never true  Transportation Needs: No Transportation Needs (06/04/2022)   PRAPARE - Administrator, Civil Service (Medical): No    Lack of Transportation (Non-Medical): No  Physical Activity: Not on file  Stress: Not on file  Social Connections: Unknown (09/06/2022)   Received from [facility]   Social Network     Social Network: Not on file  Intimate Partner Violence: Unknown (09/06/2022)   Received from [facility]   HITS    Physically Hurt: Not on file    Insult or Talk Down To: Not on file    Threaten Physical Harm: Not on file    Scream or Curse: Not on file    Family History  Problem Relation Age of Onset   Diabetes type II Brother    Hypertension Brother    Bladder Cancer Neg Hx    Kidney cancer Neg Hx    Breast cancer Neg Hx     Allergies  Allergen Reactions   Cefoxitin Diarrhea    Outpatient Medications Prior to Visit  Medication Sig   amLODipine -olmesartan  (AZOR ) 10-40 MG tablet Take 1 tablet by mouth daily.   aspirin  81 MG tablet Take 81 mg by mouth daily.   atenolol  (TENORMIN ) 100 MG tablet Take 1 tablet (100 mg total) by mouth daily.   atorvastatin  (LIPITOR) 20 MG tablet Take 1 tablet (20 mg total) by mouth every evening.   bifidobacterium infantis (ALIGN) capsule Take 1 capsule by mouth daily.   Cholecalciferol  25 MCG (1000 UT) tablet Take 1,000 Units by mouth daily.    dapagliflozin  propanediol (FARXIGA ) 10 MG TABS tablet Take 1 tablet (10  mg total) by mouth daily before breakfast.   Diclofenac  Sod-Lidocaine  HCl 1-4.5 % GEL Apply 1 Application topically 2 (two) times daily as needed.   fluticasone  (FLONASE ) 50 MCG/ACT nasal spray Place 1 spray into both nostrils daily. (Patient not taking: Reported on 03/24/2024)   glucose blood (ONETOUCH ULTRA TEST) test strip Use as instructed   metFORMIN  (GLUCOPHAGE ) 500 MG tablet Take 1 tablet (500 mg total) by mouth every morning.   Multiple Vitamin (MULTIVITAMIN) tablet Take 1 tablet by mouth daily.   Multiple Vitamins-Minerals (B COMPLEX-C-E-ZINC ) tablet Take 1 tablet by mouth daily.   nitrofurantoin , macrocrystal-monohydrate, (MACROBID ) 100 MG capsule Take 1 capsule (100 mg total) by mouth daily.   spironolactone  (ALDACTONE ) 25 MG tablet Take 0.5 tablets (12.5 mg total) by mouth daily.   No facility-administered medications  prior to visit.    Review of Systems  Constitutional:  Positive for weight loss (2 lbs).  HENT: Negative.    Eyes: Negative.   Respiratory:  Negative for shortness of breath.   Gastrointestinal: Negative.   Genitourinary: Negative.   Musculoskeletal:  Negative for joint pain.  Skin: Negative.   Neurological: Negative.   Endo/Heme/Allergies: Negative.   Psychiatric/Behavioral: Negative.         Objective:   BP 110/74   Pulse 65   Ht 5' 5 (1.651 m)   Wt 151 lb 6.4 oz (68.7 kg)   SpO2 98%   BMI 25.19 kg/m   Vitals:   04/14/24 1032  BP: 110/74  Pulse: 65  Height: 5' 5 (1.651 m)  Weight: 151 lb 6.4 oz (68.7 kg)  SpO2: 98%  BMI (Calculated): 25.19    Physical Exam Vitals reviewed.  Constitutional:      General: She is not in acute distress. HENT:     Head: Normocephalic.     Nose: Nose normal.     Mouth/Throat:     Mouth: Mucous membranes are moist.  Eyes:     Extraocular Movements: Extraocular movements intact.     Pupils: Pupils are equal, round, and reactive to light.  Neck:     Vascular: No JVD.  Cardiovascular:     Rate and Rhythm: Normal rate and regular rhythm.     Heart sounds: No murmur heard. Pulmonary:     Effort: Pulmonary effort is normal.     Breath sounds: No rhonchi or rales.  Abdominal:     General: Abdomen is flat.     Palpations: There is no hepatomegaly, splenomegaly or mass.  Musculoskeletal:        General: Normal range of motion.     Cervical back: Normal range of motion. No tenderness.     Right lower leg: 1+ Edema present.     Left lower leg: 1+ Edema present.  Skin:    General: Skin is warm and dry.  Neurological:     General: No focal deficit present.     Mental Status: She is alert and oriented to person, place, and time.     Cranial Nerves: No cranial nerve deficit.     Motor: No weakness.     Gait: Gait abnormal.  Psychiatric:        Mood and Affect: Mood normal.        Behavior: Behavior normal.      Results  for orders placed or performed in visit on 04/14/24  POCT CBG (Fasting - Glucose)  Result Value Ref Range   Glucose Fasting, POC 232 (A) 70 - 99 mg/dL  Recent Results (from the past 2160 hours)  Hemoglobin A1c     Status: Abnormal   Collection Time: 03/17/24 10:32 AM  Result Value Ref Range   Hgb A1c MFr Bld 6.3 (H) 4.8 - 5.6 %    Comment:          Prediabetes: 5.7 - 6.4          Diabetes: >6.4          Glycemic control for adults with diabetes: <7.0    Est. average glucose Bld gHb Est-mCnc 134 mg/dL  Lipid panel     Status: None   Collection Time: 03/17/24 10:35 AM  Result Value Ref Range   Cholesterol, Total 174 100 - 199 mg/dL   Triglycerides 70 0 - 149 mg/dL   HDL 85 >60 mg/dL   VLDL Cholesterol Cal 13 5 - 40 mg/dL   LDL Chol Calc (NIH) 76 0 - 99 mg/dL   Chol/HDL Ratio 2.0 0.0 - 4.4 ratio    Comment:                                   T. Chol/HDL Ratio                                             Men  Women                               1/2 Avg.Risk  3.4    3.3                                   Avg.Risk  5.0    4.4                                2X Avg.Risk  9.6    7.1                                3X Avg.Risk 23.4   11.0   POCT CBG (Fasting - Glucose)     Status: Abnormal   Collection Time: 03/24/24 10:26 AM  Result Value Ref Range   Glucose Fasting, POC 116 (A) 70 - 99 mg/dL  POCT CBG (Fasting - Glucose)     Status: Abnormal   Collection Time: 04/14/24 10:35 AM  Result Value Ref Range   Glucose Fasting, POC 232 (A) 70 - 99 mg/dL    Comment: non-fasting- ate breakfast      Assessment & Plan:  as Problem List Items Addressed This Visit       Endocrine   Type 2 diabetes mellitus with chronic kidney disease, with long-term current use of insulin  (HCC)   Relevant Orders   POCT CBG (Fasting - Glucose) (Completed)   Other Visit Diagnoses       Mild cognitive impairment    -  Primary       Return in about 9 weeks (around 06/16/2024) for fu with labs prior.    Total time spent: 30 minutes  Sherrill Cinderella Perry, MD  04/14/2024   This document may  have been prepared by [employer] and as such may include unintentional dictation errors.

## 2024-04-15 ENCOUNTER — Encounter: Payer: Self-pay | Admitting: Urology

## 2024-05-23 ENCOUNTER — Ambulatory Visit: Admitting: Podiatry

## 2024-06-06 ENCOUNTER — Other Ambulatory Visit: Payer: Self-pay | Admitting: Internal Medicine

## 2024-06-06 DIAGNOSIS — E785 Hyperlipidemia, unspecified: Secondary | ICD-10-CM

## 2024-06-10 ENCOUNTER — Other Ambulatory Visit: Payer: Self-pay | Admitting: Internal Medicine

## 2024-06-10 DIAGNOSIS — E785 Hyperlipidemia, unspecified: Secondary | ICD-10-CM

## 2024-06-10 DIAGNOSIS — E1122 Type 2 diabetes mellitus with diabetic chronic kidney disease: Secondary | ICD-10-CM

## 2024-06-12 ENCOUNTER — Telehealth: Payer: Self-pay

## 2024-06-12 ENCOUNTER — Other Ambulatory Visit: Payer: Self-pay

## 2024-06-12 DIAGNOSIS — E1122 Type 2 diabetes mellitus with diabetic chronic kidney disease: Secondary | ICD-10-CM

## 2024-06-12 DIAGNOSIS — E785 Hyperlipidemia, unspecified: Secondary | ICD-10-CM

## 2024-06-12 NOTE — Telephone Encounter (Signed)
 [facility] does not have pt's Metformin . Pt's daughter requested it be sent to [facility] so it can be picked up before they go out of town. Request already sent to you.

## 2024-06-18 ENCOUNTER — Other Ambulatory Visit: Payer: Self-pay | Admitting: Cardiovascular Disease

## 2024-06-18 DIAGNOSIS — I10 Essential (primary) hypertension: Secondary | ICD-10-CM

## 2024-06-18 DIAGNOSIS — R0602 Shortness of breath: Secondary | ICD-10-CM

## 2024-06-18 DIAGNOSIS — I340 Nonrheumatic mitral (valve) insufficiency: Secondary | ICD-10-CM

## 2024-06-18 DIAGNOSIS — E785 Hyperlipidemia, unspecified: Secondary | ICD-10-CM

## 2024-06-18 DIAGNOSIS — N1832 Chronic kidney disease, stage 3b: Secondary | ICD-10-CM

## 2024-06-18 DIAGNOSIS — I5031 Acute diastolic (congestive) heart failure: Secondary | ICD-10-CM

## 2024-06-19 ENCOUNTER — Ambulatory Visit: Admitting: Cardiovascular Disease

## 2024-06-19 ENCOUNTER — Encounter: Payer: Self-pay | Admitting: Cardiovascular Disease

## 2024-06-19 VITALS — BP 118/73 | HR 63 | Ht 65.0 in | Wt 151.0 lb

## 2024-06-19 DIAGNOSIS — N1832 Chronic kidney disease, stage 3b: Secondary | ICD-10-CM

## 2024-06-19 DIAGNOSIS — R0602 Shortness of breath: Secondary | ICD-10-CM

## 2024-06-19 DIAGNOSIS — E1122 Type 2 diabetes mellitus with diabetic chronic kidney disease: Secondary | ICD-10-CM

## 2024-06-19 DIAGNOSIS — I5031 Acute diastolic (congestive) heart failure: Secondary | ICD-10-CM

## 2024-06-19 DIAGNOSIS — I10 Essential (primary) hypertension: Secondary | ICD-10-CM | POA: Diagnosis not present

## 2024-06-19 DIAGNOSIS — I340 Nonrheumatic mitral (valve) insufficiency: Secondary | ICD-10-CM

## 2024-06-19 DIAGNOSIS — E785 Hyperlipidemia, unspecified: Secondary | ICD-10-CM

## 2024-06-19 DIAGNOSIS — R600 Localized edema: Secondary | ICD-10-CM | POA: Diagnosis not present

## 2024-06-19 DIAGNOSIS — R270 Ataxia, unspecified: Secondary | ICD-10-CM

## 2024-06-19 DIAGNOSIS — Z794 Long term (current) use of insulin: Secondary | ICD-10-CM | POA: Diagnosis not present

## 2024-06-19 MED ORDER — DAPAGLIFLOZIN PROPANEDIOL 10 MG PO TABS: 10.0000 mg | ORAL_TABLET | Freq: Every day | ORAL | 3 refills | Status: DC

## 2024-06-19 NOTE — Progress Notes (Signed)
 Cardiology Office Note   Date:  06/19/2024   ID:  Teila, Skalsky Dec 26, 1940, MRN [id number]  PCP:  Albina GORMAN Dine, MD  Cardiologist:  Denyse Bathe, MD      History of Present Illness: Tamara [name] is a 83 y.o. female who presents for  Chief Complaint  Patient presents with   Follow-up    3 month follow up    No chest pain or SOB.      Past Medical History:  Diagnosis Date   Diabetes mellitus without complication (HCC)    Hypercholesteremia    Hyperlipidemia    Hypertension      Past Surgical History:  Procedure Laterality Date   APPENDECTOMY     COLONOSCOPY WITH PROPOFOL  N/A 08/09/2018   Procedure: COLONOSCOPY WITH PROPOFOL ;  Surgeon: Gaylyn Gladis PENNER, MD;  Location: [facility] ENDOSCOPY;  Service: Endoscopy;  Laterality: N/A;   EYE SURGERY       Current Outpatient Medications  Medication Sig Dispense Refill   amLODipine -olmesartan  (AZOR ) 10-40 MG tablet Take 1 tablet by mouth daily. 90 tablet 1   aspirin  81 MG tablet Take 81 mg by mouth daily.     atenolol  (TENORMIN ) 100 MG tablet Take 1 tablet (100 mg total) by mouth daily. 90 tablet 1   atorvastatin  (LIPITOR) 20 MG tablet TAKE 1 TABLET EVERY EVENING 90 tablet 1   bifidobacterium infantis (ALIGN) capsule Take 1 capsule by mouth daily.     Cholecalciferol  25 MCG (1000 UT) tablet Take 1,000 Units by mouth daily.      Diclofenac  Sod-Lidocaine  HCl 1-4.5 % GEL Apply 1 Application topically 2 (two) times daily as needed. 99 g 0   fluticasone  (FLONASE ) 50 MCG/ACT nasal spray Place 1 spray into both nostrils daily. 16 mL 2   glucose blood (ONETOUCH ULTRA TEST) test strip Use as instructed 200 strip 2   metFORMIN  (GLUCOPHAGE ) 500 MG tablet TAKE 1 TABLET EVERY MORNING 90 tablet 1   Multiple Vitamin (MULTIVITAMIN) tablet Take 1 tablet by mouth daily.     Multiple Vitamins-Minerals (B COMPLEX-C-E-ZINC ) tablet Take 1 tablet by mouth daily.     nitrofurantoin , macrocrystal-monohydrate, (MACROBID ) 100 MG  capsule Take 1 capsule (100 mg total) by mouth daily. 90 capsule 3   spironolactone  (ALDACTONE ) 25 MG tablet Take 0.5 tablets (12.5 mg total) by mouth daily. 30 tablet 11   dapagliflozin  propanediol (FARXIGA ) 10 MG TABS tablet Take 1 tablet (10 mg total) by mouth daily before breakfast. 30 tablet 3   No current facility-administered medications for this visit.    Allergies:   Cefoxitin    Social History:   reports that she has never smoked. She has never been exposed to tobacco smoke. She has never used smokeless tobacco. She reports that she does not drink alcohol and does not use drugs.   Family History:  family history includes Diabetes type II in her brother; Hypertension in her brother.    ROS:     Review of Systems  Constitutional: Negative.   HENT: Negative.    Eyes: Negative.   Respiratory: Negative.    Gastrointestinal: Negative.   Genitourinary: Negative.   Musculoskeletal: Negative.   Skin: Negative.   Neurological: Negative.   Endo/Heme/Allergies: Negative.   Psychiatric/Behavioral: Negative.    All other systems reviewed and are negative.     All other systems are reviewed and negative.    PHYSICAL EXAM: VS:  BP 118/73   Pulse 63   Ht 5' 5 (1.651 m)  Wt 151 lb (68.5 kg)   SpO2 94%   BMI 25.13 kg/m  , BMI Body mass index is 25.13 kg/m. Last weight:  Wt Readings from Last 3 Encounters:  06/19/24 151 lb (68.5 kg)  04/14/24 151 lb 6.4 oz (68.7 kg)  03/24/24 153 lb 3.2 oz (69.5 kg)     Physical Exam Constitutional:      Appearance: Normal appearance.  Cardiovascular:     Rate and Rhythm: Normal rate and regular rhythm.     Heart sounds: Normal heart sounds.  Pulmonary:     Effort: Pulmonary effort is normal.     Breath sounds: Normal breath sounds.  Musculoskeletal:     Right lower leg: No edema.     Left lower leg: No edema.  Neurological:     Mental Status: She is alert.       EKG:   Recent Labs: 12/20/2023: ALT 12; BUN 19;  Creatinine, Ser 1.28; Potassium 4.8; Sodium 141    Lipid Panel    Component Value Date/Time   CHOL 174 03/17/2024 1035   TRIG 70 03/17/2024 1035   HDL 85 03/17/2024 1035   CHOLHDL 2.0 03/17/2024 1035   LDLCALC 76 03/17/2024 1035      Other studies Reviewed: Additional studies/ records that were reviewed today include:  Review of the above records demonstrates:       No data to display            ASSESSMENT AND PLAN:    ICD-10-CM   1. Nonrheumatic mitral valve regurgitation  I34.0 dapagliflozin  propanediol (FARXIGA ) 10 MG TABS tablet    2. SOB (shortness of breath)  R06.02 dapagliflozin  propanediol (FARXIGA ) 10 MG TABS tablet   Improved breathing.    3. Acute diastolic CHF (congestive heart failure) (HCC)  I50.31 dapagliflozin  propanediol (FARXIGA ) 10 MG TABS tablet    4. Essential hypertension  I10 dapagliflozin  propanediol (FARXIGA ) 10 MG TABS tablet    5. Dyslipidemia  E78.5 dapagliflozin  propanediol (FARXIGA ) 10 MG TABS tablet    6. Localized edema  R60.0     7. Ataxia  R27.0     8. Nonrheumatic mitral valve regurgitation  I34.0 dapagliflozin  propanediol (FARXIGA ) 10 MG TABS tablet   trace    9. SOB (shortness of breath)  R06.02 dapagliflozin  propanediol (FARXIGA ) 10 MG TABS tablet   lot better after farxiga , aldactone     10. Acute diastolic CHF (congestive heart failure) (HCC)  I50.31 dapagliflozin  propanediol (FARXIGA ) 10 MG TABS tablet   stress test no ischaemia    11. Acute diastolic CHF (congestive heart failure) (HCC)  I50.31 dapagliflozin  propanediol (FARXIGA ) 10 MG TABS tablet   Has SOB, edema of legs, orthopnea, and normal EF. Add farxiga /aldactone , and stop KCL. Also r/o CAD, stress test    12. Type 2 diabetes mellitus with stage 3b chronic kidney disease, with long-term current use of insulin  (HCC)  E11.22 dapagliflozin  propanediol (FARXIGA ) 10 MG TABS tablet   N18.32    Z79.4     13. Essential hypertension  I10 dapagliflozin  propanediol  (FARXIGA ) 10 MG TABS tablet   High, BP add aldactone  since creat normal. Stopp KCL    14. SOB (shortness of breath)  R06.02 dapagliflozin  propanediol (FARXIGA ) 10 MG TABS tablet       Problem List Items Addressed This Visit       Cardiovascular and Mediastinum   Essential hypertension   Relevant Medications   dapagliflozin  propanediol (FARXIGA ) 10 MG TABS tablet     Endocrine  Type 2 diabetes mellitus with chronic kidney disease, with long-term current use of insulin  (HCC)   Relevant Medications   dapagliflozin  propanediol (FARXIGA ) 10 MG TABS tablet     Other   Dyslipidemia   Relevant Medications   dapagliflozin  propanediol (FARXIGA ) 10 MG TABS tablet   Other Visit Diagnoses       Nonrheumatic mitral valve regurgitation    -  Primary   Relevant Medications   dapagliflozin  propanediol (FARXIGA ) 10 MG TABS tablet     SOB (shortness of breath)       Improved breathing.   Relevant Medications   dapagliflozin  propanediol (FARXIGA ) 10 MG TABS tablet     Acute diastolic CHF (congestive heart failure) (HCC)       Relevant Medications   dapagliflozin  propanediol (FARXIGA ) 10 MG TABS tablet     Localized edema         Ataxia         Nonrheumatic mitral valve regurgitation       trace   Relevant Medications   dapagliflozin  propanediol (FARXIGA ) 10 MG TABS tablet     SOB (shortness of breath)       lot better after farxiga , aldactone    Relevant Medications   dapagliflozin  propanediol (FARXIGA ) 10 MG TABS tablet     Acute diastolic CHF (congestive heart failure) (HCC)       stress test no ischaemia   Relevant Medications   dapagliflozin  propanediol (FARXIGA ) 10 MG TABS tablet     Acute diastolic CHF (congestive heart failure) (HCC)       Has SOB, edema of legs, orthopnea, and normal EF. Add farxiga /aldactone , and stop KCL. Also r/o CAD, stress test   Relevant Medications   dapagliflozin  propanediol (FARXIGA ) 10 MG TABS tablet     SOB (shortness of breath)       Relevant  Medications   dapagliflozin  propanediol (FARXIGA ) 10 MG TABS tablet          Disposition:   Return in about 3 months (around 09/17/2024).    Total time spent: 30 minutes  Signed,  Denyse Bathe, MD  06/19/2024 10:08 AM    [facility]

## 2024-06-20 ENCOUNTER — Ambulatory Visit: Admitting: Podiatry

## 2024-06-23 ENCOUNTER — Encounter: Payer: Self-pay | Admitting: Internal Medicine

## 2024-06-23 ENCOUNTER — Ambulatory Visit: Admitting: Internal Medicine

## 2024-06-23 VITALS — BP 119/78 | HR 59 | Temp 97.9°F | Ht 65.0 in | Wt 150.8 lb

## 2024-06-23 DIAGNOSIS — E1122 Type 2 diabetes mellitus with diabetic chronic kidney disease: Secondary | ICD-10-CM

## 2024-06-23 DIAGNOSIS — I10 Essential (primary) hypertension: Secondary | ICD-10-CM | POA: Diagnosis not present

## 2024-06-23 DIAGNOSIS — N1832 Chronic kidney disease, stage 3b: Secondary | ICD-10-CM | POA: Diagnosis not present

## 2024-06-23 DIAGNOSIS — I5032 Chronic diastolic (congestive) heart failure: Secondary | ICD-10-CM | POA: Diagnosis not present

## 2024-06-23 DIAGNOSIS — Z794 Long term (current) use of insulin: Secondary | ICD-10-CM

## 2024-06-23 LAB — POC CREATINE & ALBUMIN,URINE
Creatinine, POC: 50 mg/dL
Microalbumin Ur, POC: 30 mg/L

## 2024-06-23 LAB — POCT CBG (FASTING - GLUCOSE)-MANUAL ENTRY: Glucose Fasting, POC: 139 mg/dL — AB (ref 70–99)

## 2024-06-23 MED ORDER — AMLODIPINE-OLMESARTAN 10-40 MG PO TABS: 1.0000 | ORAL_TABLET | Freq: Every day | ORAL | 1 refills | Status: AC

## 2024-06-23 NOTE — Progress Notes (Signed)
 "  Established Patient Office Visit  Subjective:  Patient ID: Tamara [name], female    DOB: [birth year]  Age: 83 y.o. MRN: [id number]  Chief Complaint  Patient presents with   Follow-up    3 month follow up with lab results    No new complaints, here for lab review and medication refills. Denies any hypoglycemic episodes and home bg readings have been at target. Failed to have previsit labs done.      No other concerns at this time.   Past Medical History:  Diagnosis Date   Diabetes mellitus without complication (HCC)    Hypercholesteremia    Hyperlipidemia    Hypertension     Past Surgical History:  Procedure Laterality Date   APPENDECTOMY     COLONOSCOPY WITH PROPOFOL  N/A 08/09/2018   Procedure: COLONOSCOPY WITH PROPOFOL ;  Surgeon: Gaylyn Gladis PENNER, MD;  Location: [facility] ENDOSCOPY;  Service: Endoscopy;  Laterality: N/A;   EYE SURGERY      Social History   Socioeconomic History   Marital status: Widowed    Spouse name: Not on file   Number of children: Not on file   Years of education: Not on file   Highest education level: Not on file  Occupational History   Not on file  Tobacco Use   Smoking status: Never    Passive exposure: Never   Smokeless tobacco: Never  Vaping Use   Vaping status: Never Used  Substance and Sexual Activity   Alcohol use: No   Drug use: Never   Sexual activity: Not Currently    Birth control/protection: Post-menopausal  Other Topics Concern   Not on file  Social History Narrative   Not on file   Social Drivers of Health   Tobacco Use: Low Risk (06/23/2024)   Patient History    Smoking Tobacco Use: Never    Smokeless Tobacco Use: Never    Passive Exposure: Never  Financial Resource Strain: Not on file  Food Insecurity: No Food Insecurity (06/04/2022)   Hunger Vital Sign    Worried About Running Out of Food in the Last Year: Never true    Ran Out of Food in the Last Year: Never true  Transportation Needs: No  Transportation Needs (06/04/2022)   PRAPARE - Administrator, Civil Service (Medical): No    Lack of Transportation (Non-Medical): No  Physical Activity: Not on file  Stress: Not on file  Social Connections: Unknown (09/06/2022)   [facility] from [facility]   Social Network    Social Network: Not on file  Intimate Partner Violence: Unknown (09/06/2022)   Received from [facility]   HITS    Physically Hurt: Not on file    Insult or Talk Down To: Not on file    Threaten Physical Harm: Not on file    Scream or Curse: Not on file  Depression (PHQ2-9): Low Risk (03/20/2023)   Depression (PHQ2-9)    PHQ-2 Score: 1  Alcohol Screen: Not on file  Housing: Low Risk (06/04/2022)   Housing    Last Housing Risk Score: 0  Utilities: Not At Risk (06/04/2022)   AHC Utilities    Threatened with loss of utilities: No  Health Literacy: Not on file    Family History  Problem Relation Age of Onset   Diabetes type II Brother    Hypertension Brother    Bladder Cancer Neg Hx    Kidney cancer Neg Hx    Breast cancer Neg Hx  Allergies[1]  Show/hide medication list[2]  Review of Systems  Constitutional:  Positive for weight loss (3 lbs).  HENT: Negative.    Eyes: Negative.   Respiratory:  Negative for shortness of breath.   Gastrointestinal: Negative.   Genitourinary: Negative.   Musculoskeletal:  Negative for joint pain.  Skin: Negative.   Neurological: Negative.   Endo/Heme/Allergies: Negative.   Psychiatric/Behavioral: Negative.         Objective:   BP 119/78   Pulse (!) 59   Temp 97.9 F (36.6 C)   Ht 5' 5 (1.651 m)   Wt 150 lb 12.8 oz (68.4 kg)   SpO2 95%   BMI 25.09 kg/m   Vitals:   06/23/24 0952  BP: 119/78  Pulse: (!) 59  Temp: 97.9 F (36.6 C)  Height: 5' 5 (1.651 m)  Weight: 150 lb 12.8 oz (68.4 kg)  SpO2: 95%  BMI (Calculated): 25.09    Physical Exam Vitals reviewed.  Constitutional:      General: She is not in acute distress. HENT:      Head: Normocephalic.     Nose: Nose normal.     Mouth/Throat:     Mouth: Mucous membranes are moist.  Eyes:     Extraocular Movements: Extraocular movements intact.     Pupils: Pupils are equal, round, and reactive to light.  Neck:     Vascular: No JVD.  Cardiovascular:     Rate and Rhythm: Normal rate and regular rhythm.     Heart sounds: No murmur heard. Pulmonary:     Effort: Pulmonary effort is normal.     Breath sounds: No rhonchi or rales.  Abdominal:     General: Abdomen is flat.     Palpations: There is no hepatomegaly, splenomegaly or mass.  Musculoskeletal:        General: Normal range of motion.     Cervical back: Normal range of motion. No tenderness.     Right lower leg: 1+ Edema present.     Left lower leg: 1+ Edema present.  Skin:    General: Skin is warm and dry.  Neurological:     General: No focal deficit present.     Mental Status: She is alert and oriented to person, place, and time.     Cranial Nerves: No cranial nerve deficit.     Motor: No weakness.     Gait: Gait abnormal.  Psychiatric:        Mood and Affect: Mood normal.        Behavior: Behavior normal.      Results for orders placed or performed in visit on 06/23/24  POCT CBG (Fasting - Glucose)  Result Value Ref Range   Glucose Fasting, POC 139 (A) 70 - 99 mg/dL    Recent Results (from the past 2160 hours)  POCT CBG (Fasting - Glucose)     Status: Abnormal   Collection Time: 04/14/24 10:35 AM  Result Value Ref Range   Glucose Fasting, POC 232 (A) 70 - 99 mg/dL    Comment: non-fasting- ate breakfast  POCT CBG (Fasting - Glucose)     Status: Abnormal   Collection Time: 06/23/24 10:02 AM  Result Value Ref Range   Glucose Fasting, POC 139 (A) 70 - 99 mg/dL      Assessment & Plan:  Misheel was seen today for follow-up.  Type 2 diabetes mellitus with stage 3b chronic kidney disease, with long-term current use of insulin  (HCC) -     POCT  CBG (Fasting - Glucose)  Essential  hypertension -     amLODIPine -Olmesartan ; Take 1 tablet by mouth daily.  Dispense: 90 tablet; Refill: 1  Chronic diastolic heart failure (HCC)    Problem List Items Addressed This Visit       Cardiovascular and Mediastinum   Essential hypertension   Relevant Medications   amLODipine -olmesartan  (AZOR ) 10-40 MG tablet     Endocrine   Type 2 diabetes mellitus with chronic kidney disease, with long-term current use of insulin  (HCC) - Primary   Relevant Medications   amLODipine -olmesartan  (AZOR ) 10-40 MG tablet   Other Relevant Orders   POCT CBG (Fasting - Glucose) (Completed)   Other Visit Diagnoses       Chronic diastolic heart failure (HCC)       Relevant Medications   amLODipine -olmesartan  (AZOR ) 10-40 MG tablet       Return in about 3 months (around 09/21/2024) for fu with labs prior.   Total time spent: 30 minutes. This time includes review of previous notes and results and patient face to face interaction during today'Tersea Aulds visit.    Sherrill Cinderella Perry, MD  06/23/2024   This document may have been prepared by [facility] Voice Recognition software and as such may include unintentional dictation errors.     [1]  Allergies Allergen Reactions   Cefoxitin Diarrhea  [2]  Outpatient Medications Prior to Visit  Medication Sig   aspirin  81 MG tablet Take 81 mg by mouth daily.   atenolol  (TENORMIN ) 100 MG tablet Take 1 tablet (100 mg total) by mouth daily.   atorvastatin  (LIPITOR) 20 MG tablet TAKE 1 TABLET EVERY EVENING   bifidobacterium infantis (ALIGN) capsule Take 1 capsule by mouth daily.   Cholecalciferol  25 MCG (1000 UT) tablet Take 1,000 Units by mouth daily.    dapagliflozin  propanediol (FARXIGA ) 10 MG TABS tablet Take 1 tablet (10 mg total) by mouth daily before breakfast.   Diclofenac  Sod-Lidocaine  HCl 1-4.5 % GEL Apply 1 Application topically 2 (two) times daily as needed.   FARXIGA  10 MG TABS tablet TAKE ONE TABLET BY MOUTH ONCE DAILY BEFORE BREAKFAST    fluticasone  (FLONASE ) 50 MCG/ACT nasal spray Place 1 spray into both nostrils daily.   glucose blood (ONETOUCH ULTRA TEST) test strip Use as instructed   metFORMIN  (GLUCOPHAGE ) 500 MG tablet TAKE 1 TABLET EVERY MORNING   Multiple Vitamin (MULTIVITAMIN) tablet Take 1 tablet by mouth daily.   Multiple Vitamins-Minerals (B COMPLEX-C-E-ZINC ) tablet Take 1 tablet by mouth daily.   nitrofurantoin , macrocrystal-monohydrate, (MACROBID ) 100 MG capsule Take 1 capsule (100 mg total) by mouth daily.   spironolactone  (ALDACTONE ) 25 MG tablet Take 0.5 tablets (12.5 mg total) by mouth daily.   [DISCONTINUED] amLODipine -olmesartan  (AZOR ) 10-40 MG tablet Take 1 tablet by mouth daily.   No facility-administered medications prior to visit.   "

## 2024-06-24 LAB — COMPREHENSIVE METABOLIC PANEL WITH GFR
ALT: 14 IU/L (ref 0–32)
AST: 22 IU/L (ref 0–40)
Albumin: 4.4 g/dL (ref 3.7–4.7)
Alkaline Phosphatase: 66 IU/L (ref 48–129)
BUN/Creatinine Ratio: 15 (ref 12–28)
BUN: 21 mg/dL (ref 8–27)
Bilirubin Total: 0.9 mg/dL (ref 0.0–1.2)
CO2: 20 mmol/L (ref 20–29)
Calcium: 10.8 mg/dL — ABNORMAL HIGH (ref 8.7–10.3)
Chloride: 103 mmol/L (ref 96–106)
Creatinine, Ser: 1.4 mg/dL — ABNORMAL HIGH (ref 0.57–1.00)
Globulin, Total: 2.2 g/dL (ref 1.5–4.5)
Glucose: 133 mg/dL — ABNORMAL HIGH (ref 70–99)
Potassium: 4.5 mmol/L (ref 3.5–5.2)
Sodium: 139 mmol/L (ref 134–144)
Total Protein: 6.6 g/dL (ref 6.0–8.5)
eGFR: 38 mL/min/1.73 — ABNORMAL LOW

## 2024-06-24 LAB — LIPID PANEL
Chol/HDL Ratio: 2 ratio (ref 0.0–4.4)
Cholesterol, Total: 168 mg/dL (ref 100–199)
HDL: 86 mg/dL
LDL Chol Calc (NIH): 69 mg/dL (ref 0–99)
Triglycerides: 68 mg/dL (ref 0–149)
VLDL Cholesterol Cal: 13 mg/dL (ref 5–40)

## 2024-06-24 LAB — HEMOGLOBIN A1C
Est. average glucose Bld gHb Est-mCnc: 140 mg/dL
Hgb A1c MFr Bld: 6.5 % — ABNORMAL HIGH (ref 4.8–5.6)

## 2024-06-30 ENCOUNTER — Other Ambulatory Visit: Payer: Self-pay | Admitting: Internal Medicine

## 2024-06-30 ENCOUNTER — Ambulatory Visit: Payer: Self-pay | Admitting: Internal Medicine

## 2024-06-30 DIAGNOSIS — I10 Essential (primary) hypertension: Secondary | ICD-10-CM

## 2024-07-04 ENCOUNTER — Ambulatory Visit: Admitting: Podiatry

## 2024-07-07 ENCOUNTER — Encounter: Payer: Self-pay | Admitting: Cardiovascular Disease

## 2024-07-07 ENCOUNTER — Ambulatory Visit (INDEPENDENT_AMBULATORY_CARE_PROVIDER_SITE_OTHER): Admitting: Cardiovascular Disease

## 2024-07-07 VITALS — BP 156/82 | HR 70 | Ht 65.0 in | Wt 151.8 lb

## 2024-07-07 DIAGNOSIS — R0602 Shortness of breath: Secondary | ICD-10-CM | POA: Diagnosis not present

## 2024-07-07 DIAGNOSIS — I10 Essential (primary) hypertension: Secondary | ICD-10-CM | POA: Diagnosis not present

## 2024-07-07 DIAGNOSIS — E1122 Type 2 diabetes mellitus with diabetic chronic kidney disease: Secondary | ICD-10-CM | POA: Diagnosis not present

## 2024-07-07 DIAGNOSIS — Z794 Long term (current) use of insulin: Secondary | ICD-10-CM | POA: Diagnosis not present

## 2024-07-07 DIAGNOSIS — I5032 Chronic diastolic (congestive) heart failure: Secondary | ICD-10-CM | POA: Diagnosis not present

## 2024-07-07 DIAGNOSIS — I340 Nonrheumatic mitral (valve) insufficiency: Secondary | ICD-10-CM | POA: Diagnosis not present

## 2024-07-07 DIAGNOSIS — N1832 Chronic kidney disease, stage 3b: Secondary | ICD-10-CM | POA: Diagnosis not present

## 2024-07-07 DIAGNOSIS — I5031 Acute diastolic (congestive) heart failure: Secondary | ICD-10-CM

## 2024-07-07 DIAGNOSIS — E785 Hyperlipidemia, unspecified: Secondary | ICD-10-CM

## 2024-07-07 MED ORDER — DAPAGLIFLOZIN PROPANEDIOL 10 MG PO TABS: 10.0000 mg | ORAL_TABLET | Freq: Every day | ORAL | 2 refills | Status: AC

## 2024-07-07 NOTE — Progress Notes (Signed)
 "     Cardiology Office Note   Date:  07/07/2024   ID:  Tamara, [name] 14-Jan-1941, MRN [id number]  PCP:  Albina GORMAN Dine, MD  Cardiologist:  Denyse Bathe, MD      History of Present Illness: Tamara [name] is a 84 y.o. female who presents for  Chief Complaint  Patient presents with   Follow-up    Lab results follow up    Doing well.      Past Medical History:  Diagnosis Date   Diabetes mellitus without complication (HCC)    Hypercholesteremia    Hyperlipidemia    Hypertension      Past Surgical History:  Procedure Laterality Date   APPENDECTOMY     COLONOSCOPY WITH PROPOFOL  N/A 08/09/2018   Procedure: COLONOSCOPY WITH PROPOFOL ;  Surgeon: Gaylyn Gladis PENNER, MD;  Location: [facility] ENDOSCOPY;  Service: Endoscopy;  Laterality: N/A;   EYE SURGERY       Current Outpatient Medications  Medication Sig Dispense Refill   amLODipine -olmesartan  (AZOR ) 10-40 MG tablet Take 1 tablet by mouth daily. 90 tablet 1   aspirin  81 MG tablet Take 81 mg by mouth daily.     atenolol  (TENORMIN ) 100 MG tablet TAKE 1 TABLET DAILY 90 tablet 1   atorvastatin  (LIPITOR) 20 MG tablet TAKE 1 TABLET EVERY EVENING 90 tablet 1   bifidobacterium infantis (ALIGN) capsule Take 1 capsule by mouth daily.     Cholecalciferol  25 MCG (1000 UT) tablet Take 1,000 Units by mouth daily.      dapagliflozin  propanediol (FARXIGA ) 10 MG TABS tablet Take 1 tablet (10 mg total) by mouth daily before breakfast. 30 tablet 3   glucose blood (ONETOUCH ULTRA TEST) test strip Use as instructed 200 strip 2   metFORMIN  (GLUCOPHAGE ) 500 MG tablet TAKE 1 TABLET EVERY MORNING 90 tablet 1   Multiple Vitamin (MULTIVITAMIN) tablet Take 1 tablet by mouth daily.     Multiple Vitamins-Minerals (B COMPLEX-C-E-ZINC ) tablet Take 1 tablet by mouth daily.     nitrofurantoin , macrocrystal-monohydrate, (MACROBID ) 100 MG capsule Take 1 capsule (100 mg total) by mouth daily. 90 capsule 3   spironolactone  (ALDACTONE ) 25 MG tablet  Take 0.5 tablets (12.5 mg total) by mouth daily. 30 tablet 11   dapagliflozin  propanediol (FARXIGA ) 10 MG TABS tablet Take 1 tablet (10 mg total) by mouth daily. 30 tablet 2   No current facility-administered medications for this visit.    Allergies:   Cefoxitin    Social History:   reports that she has never smoked. She has never been exposed to tobacco smoke. She has never used smokeless tobacco. She reports that she does not drink alcohol and does not use drugs.   Family History:  family history includes Diabetes type II in her brother; Hypertension in her brother.    ROS:     Review of Systems  Constitutional: Negative.   HENT: Negative.    Eyes: Negative.   Respiratory: Negative.    Gastrointestinal: Negative.   Genitourinary: Negative.   Musculoskeletal: Negative.   Skin: Negative.   Neurological: Negative.   Endo/Heme/Allergies: Negative.   Psychiatric/Behavioral: Negative.    All other systems reviewed and are negative.     All other systems are reviewed and negative.    PHYSICAL EXAM: VS:  BP (!) 156/82   Pulse 70   Ht 5' 5 (1.651 m)   Wt 151 lb 12.8 oz (68.9 kg)   SpO2 99%   BMI 25.26 kg/m  , BMI Body  mass index is 25.26 kg/m. Last weight:  Wt Readings from Last 3 Encounters:  07/07/24 151 lb 12.8 oz (68.9 kg)  06/23/24 150 lb 12.8 oz (68.4 kg)  06/19/24 151 lb (68.5 kg)     Physical Exam Constitutional:      Appearance: Normal appearance.  Cardiovascular:     Rate and Rhythm: Normal rate and regular rhythm.     Heart sounds: Normal heart sounds.  Pulmonary:     Effort: Pulmonary effort is normal.     Breath sounds: Normal breath sounds.  Musculoskeletal:     Right lower leg: No edema.     Left lower leg: No edema.  Neurological:     Mental Status: She is alert.       EKG:   Recent Labs: 06/23/2024: ALT 14; BUN 21; Creatinine, Ser 1.40; Potassium 4.5; Sodium 139    Lipid Panel    Component Value Date/Time   CHOL 168 06/23/2024  1006   TRIG 68 06/23/2024 1006   HDL 86 06/23/2024 1006   CHOLHDL 2.0 06/23/2024 1006   LDLCALC 69 06/23/2024 1006      Other studies Reviewed: Additional studies/ records that were reviewed today include:  Review of the above records demonstrates:       No data to display            ASSESSMENT AND PLAN:    ICD-10-CM   1. Essential hypertension  I10 dapagliflozin  propanediol (FARXIGA ) 10 MG TABS tablet    Comprehensive metabolic panel    2. Chronic diastolic heart failure (HCC)  P49.67 Comprehensive metabolic panel    3. Nonrheumatic mitral valve regurgitation  I34.0 dapagliflozin  propanediol (FARXIGA ) 10 MG TABS tablet    Comprehensive metabolic panel    4. SOB (shortness of breath)  R06.02 dapagliflozin  propanediol (FARXIGA ) 10 MG TABS tablet    Comprehensive metabolic panel    5. Acute diastolic CHF (congestive heart failure) (HCC)  I50.31 dapagliflozin  propanediol (FARXIGA ) 10 MG TABS tablet    Comprehensive metabolic panel    6. Dyslipidemia  E78.5 dapagliflozin  propanediol (FARXIGA ) 10 MG TABS tablet    Comprehensive metabolic panel    7. Type 2 diabetes mellitus with stage 3b chronic kidney disease, with long-term current use of insulin  (HCC)  E11.22 dapagliflozin  propanediol (FARXIGA ) 10 MG TABS tablet   N18.32 Comprehensive metabolic panel   S20.5     8. Nonrheumatic mitral valve regurgitation  I34.0 dapagliflozin  propanediol (FARXIGA ) 10 MG TABS tablet    Comprehensive metabolic panel   trace    9. SOB (shortness of breath)  R06.02 dapagliflozin  propanediol (FARXIGA ) 10 MG TABS tablet    Comprehensive metabolic panel   lot better after farxiga , aldactone     10. Acute diastolic CHF (congestive heart failure) (HCC)  I50.31 dapagliflozin  propanediol (FARXIGA ) 10 MG TABS tablet    Comprehensive metabolic panel   stress test no ischaemia    11. Acute diastolic CHF (congestive heart failure) (HCC)  I50.31 dapagliflozin  propanediol (FARXIGA ) 10 MG TABS  tablet    Comprehensive metabolic panel   Has SOB, edema of legs, orthopnea, and normal EF. Add farxiga /aldactone , and stop KCL. Also r/o CAD, stress test    12. Essential hypertension  I10 dapagliflozin  propanediol (FARXIGA ) 10 MG TABS tablet    Comprehensive metabolic panel   High, BP high due not taking morning medication.       Problem List Items Addressed This Visit       Cardiovascular and Mediastinum   Essential hypertension -  Primary   Relevant Medications   dapagliflozin  propanediol (FARXIGA ) 10 MG TABS tablet   Other Relevant Orders   Comprehensive metabolic panel     Endocrine   Type 2 diabetes mellitus with chronic kidney disease, with long-term current use of insulin  (HCC)   Relevant Medications   dapagliflozin  propanediol (FARXIGA ) 10 MG TABS tablet   Other Relevant Orders   Comprehensive metabolic panel     Other   Dyslipidemia   Relevant Medications   dapagliflozin  propanediol (FARXIGA ) 10 MG TABS tablet   Other Relevant Orders   Comprehensive metabolic panel   Other Visit Diagnoses       Chronic diastolic heart failure (HCC)       Relevant Orders   Comprehensive metabolic panel     Nonrheumatic mitral valve regurgitation       Relevant Medications   dapagliflozin  propanediol (FARXIGA ) 10 MG TABS tablet   Other Relevant Orders   Comprehensive metabolic panel     SOB (shortness of breath)       Relevant Medications   dapagliflozin  propanediol (FARXIGA ) 10 MG TABS tablet   Other Relevant Orders   Comprehensive metabolic panel     Acute diastolic CHF (congestive heart failure) (HCC)       Relevant Medications   dapagliflozin  propanediol (FARXIGA ) 10 MG TABS tablet   Other Relevant Orders   Comprehensive metabolic panel     Nonrheumatic mitral valve regurgitation       trace   Relevant Medications   dapagliflozin  propanediol (FARXIGA ) 10 MG TABS tablet   Other Relevant Orders   Comprehensive metabolic panel     SOB (shortness of breath)        lot better after farxiga , aldactone    Relevant Medications   dapagliflozin  propanediol (FARXIGA ) 10 MG TABS tablet   Other Relevant Orders   Comprehensive metabolic panel     Acute diastolic CHF (congestive heart failure) (HCC)       stress test no ischaemia   Relevant Medications   dapagliflozin  propanediol (FARXIGA ) 10 MG TABS tablet   Other Relevant Orders   Comprehensive metabolic panel     Acute diastolic CHF (congestive heart failure) (HCC)       Has SOB, edema of legs, orthopnea, and normal EF. Add farxiga /aldactone , and stop KCL. Also r/o CAD, stress test   Relevant Medications   dapagliflozin  propanediol (FARXIGA ) 10 MG TABS tablet   Other Relevant Orders   Comprehensive metabolic panel          Disposition:   Return in about 4 weeks (around 08/04/2024) for Get lab few days priot to visit.    Total time spent: 35 minutes  Signed,  Denyse Bathe, MD  07/07/2024 11:48 AM    [facility] "

## 2024-07-15 ENCOUNTER — Encounter: Payer: Self-pay | Admitting: Podiatry

## 2024-07-15 ENCOUNTER — Ambulatory Visit: Admitting: Podiatry

## 2024-07-15 VITALS — Ht 65.0 in | Wt 151.8 lb

## 2024-07-15 DIAGNOSIS — M79674 Pain in right toe(s): Secondary | ICD-10-CM | POA: Diagnosis not present

## 2024-07-15 DIAGNOSIS — M79675 Pain in left toe(s): Secondary | ICD-10-CM | POA: Diagnosis not present

## 2024-07-15 DIAGNOSIS — B351 Tinea unguium: Secondary | ICD-10-CM | POA: Diagnosis not present

## 2024-07-15 NOTE — Progress Notes (Signed)
" ° °  Chief Complaint  Patient presents with   Nail Problem    [facility]    SUBJECTIVE Patient with a history of diabetes mellitus presents to office today complaining of elongated, thickened nails that cause pain while ambulating in shoes.  Patient is unable to trim their own nails.   Past Medical History:  Diagnosis Date   Diabetes mellitus without complication (HCC)    Hypercholesteremia    Hyperlipidemia    Hypertension     Allergies  Allergen Reactions   Cefoxitin Diarrhea     OBJECTIVE General Patient is awake, alert, and oriented x 3 and in no acute distress. Derm Skin is dry and supple bilateral. Negative open lesions or macerations. Remaining integument unremarkable. Nails are tender, long, thickened and dystrophic with subungual debris, consistent with onychomycosis, 1-5 bilateral. No signs of infection noted. Vasc heavy edema noted bilateral lower extremities with venous insufficiency and skin discoloration to the bilateral feet Neuro light touch and protective threshold sensation diminished bilaterally.  Musculoskeletal Exam No symptomatic pedal deformities noted bilateral.  No pain  ASSESSMENT 1. Diabetes Mellitus w/ peripheral neuropathy 2.  Pain due to onychomycosis of toenails bilateral  PLAN OF CARE -Patient evaluated - Continue knee-high compression socks.  -Mechanical debridement of nails 1-5 bilateral was performed using a nail nipper without incident or bleeding.  Smoothed with a rotary bur -Return to clinic 3 months routine footcare    Thresa EMERSON Sar, DPM [facility]  Dr. Thresa EMERSON Sar, DPM    [address] [place], [place] [address]                Office [phone]  Fax [phone]      "

## 2024-07-24 LAB — COMPREHENSIVE METABOLIC PANEL WITH GFR
ALT: 16 IU/L (ref 0–32)
AST: 19 IU/L (ref 0–40)
Albumin: 4.5 g/dL (ref 3.7–4.7)
Alkaline Phosphatase: 66 IU/L (ref 48–129)
BUN/Creatinine Ratio: 15 (ref 12–28)
BUN: 18 mg/dL (ref 8–27)
Bilirubin Total: 0.6 mg/dL (ref 0.0–1.2)
CO2: 22 mmol/L (ref 20–29)
Calcium: 10.4 mg/dL — ABNORMAL HIGH (ref 8.7–10.3)
Chloride: 103 mmol/L (ref 96–106)
Creatinine, Ser: 1.2 mg/dL — ABNORMAL HIGH (ref 0.57–1.00)
Globulin, Total: 2 g/dL (ref 1.5–4.5)
Glucose: 120 mg/dL — ABNORMAL HIGH (ref 70–99)
Potassium: 4.7 mmol/L (ref 3.5–5.2)
Sodium: 140 mmol/L (ref 134–144)
Total Protein: 6.5 g/dL (ref 6.0–8.5)
eGFR: 45 mL/min/1.73 — ABNORMAL LOW

## 2024-08-08 ENCOUNTER — Ambulatory Visit: Admitting: Cardiovascular Disease

## 2024-08-08 ENCOUNTER — Encounter: Payer: Self-pay | Admitting: Cardiovascular Disease

## 2024-08-08 VITALS — BP 116/72 | HR 64 | Ht 65.0 in | Wt 155.2 lb

## 2024-08-08 DIAGNOSIS — R609 Edema, unspecified: Secondary | ICD-10-CM

## 2024-08-08 DIAGNOSIS — I5031 Acute diastolic (congestive) heart failure: Secondary | ICD-10-CM

## 2024-08-08 DIAGNOSIS — Z794 Long term (current) use of insulin: Secondary | ICD-10-CM

## 2024-08-08 DIAGNOSIS — R0602 Shortness of breath: Secondary | ICD-10-CM

## 2024-08-08 DIAGNOSIS — I10 Essential (primary) hypertension: Secondary | ICD-10-CM

## 2024-08-08 DIAGNOSIS — E785 Hyperlipidemia, unspecified: Secondary | ICD-10-CM

## 2024-08-08 DIAGNOSIS — I5032 Chronic diastolic (congestive) heart failure: Secondary | ICD-10-CM

## 2024-08-08 DIAGNOSIS — I340 Nonrheumatic mitral (valve) insufficiency: Secondary | ICD-10-CM

## 2024-08-08 MED ORDER — DAPAGLIFLOZIN PROPANEDIOL 10 MG PO TABS: 10.0000 mg | ORAL_TABLET | Freq: Every day | ORAL | 3 refills | Status: AC

## 2024-08-08 MED ORDER — SPIRONOLACTONE 25 MG PO TABS: 12.5000 mg | ORAL_TABLET | Freq: Every day | ORAL | 11 refills | Status: AC

## 2024-08-08 MED ORDER — TORSEMIDE 10 MG PO TABS: 10.0000 mg | ORAL_TABLET | Freq: Every day | ORAL | 11 refills | Status: AC

## 2024-08-08 NOTE — Progress Notes (Signed)
 "     Cardiology Office Note   Date:  08/08/2024   ID:  Tamara [name], Tamara [name] 1940-10-08, MRN [id number]  PCP:  Albina GORMAN Dine, MD  Cardiologist:  Denyse Bathe, MD      History of Present Illness: Tamara [name] is a 84 y.o. female who presents for  Chief Complaint  Patient presents with   Follow-up    4 week follow up lab results    Had swelling of legs and SOB.      Past Medical History:  Diagnosis Date   Diabetes mellitus without complication (HCC)    Hypercholesteremia    Hyperlipidemia    Hypertension      Past Surgical History:  Procedure Laterality Date   APPENDECTOMY     COLONOSCOPY WITH PROPOFOL  N/A 08/09/2018   Procedure: COLONOSCOPY WITH PROPOFOL ;  Surgeon: Gaylyn Gladis PENNER, MD;  Location: [facility] ENDOSCOPY;  Service: Endoscopy;  Laterality: N/A;   EYE SURGERY       Current Outpatient Medications  Medication Sig Dispense Refill   amLODipine -olmesartan  (AZOR ) 10-40 MG tablet Take 1 tablet by mouth daily. 90 tablet 1   aspirin  81 MG tablet Take 81 mg by mouth daily.     atenolol  (TENORMIN ) 100 MG tablet TAKE 1 TABLET DAILY 90 tablet 1   atorvastatin  (LIPITOR) 20 MG tablet TAKE 1 TABLET EVERY EVENING 90 tablet 1   bifidobacterium infantis (ALIGN) capsule Take 1 capsule by mouth daily.     Cholecalciferol  25 MCG (1000 UT) tablet Take 1,000 Units by mouth daily.      glucose blood (ONETOUCH ULTRA TEST) test strip Use as instructed 200 strip 2   metFORMIN  (GLUCOPHAGE ) 500 MG tablet TAKE 1 TABLET EVERY MORNING 90 tablet 1   Multiple Vitamin (MULTIVITAMIN) tablet Take 1 tablet by mouth daily.     nitrofurantoin , macrocrystal-monohydrate, (MACROBID ) 100 MG capsule Take 1 capsule (100 mg total) by mouth daily. 90 capsule 3   omega-3 acid ethyl esters (LOVAZA) 1 g capsule Take 1 g by mouth daily.     torsemide  (DEMADEX ) 10 MG tablet Take 1 tablet (10 mg total) by mouth daily. 30 tablet 11   zinc  gluconate 50 MG tablet Take 50 mg by mouth daily.      dapagliflozin  propanediol (FARXIGA ) 10 MG TABS tablet Take 1 tablet (10 mg total) by mouth daily. 30 tablet 2   dapagliflozin  propanediol (FARXIGA ) 10 MG TABS tablet Take 1 tablet (10 mg total) by mouth daily before breakfast. 30 tablet 3   Multiple Vitamins-Minerals (B COMPLEX-C-E-ZINC ) tablet Take 1 tablet by mouth daily. (Patient not taking: Reported on 08/08/2024)     spironolactone  (ALDACTONE ) 25 MG tablet Take 0.5 tablets (12.5 mg total) by mouth daily. 30 tablet 11   No current facility-administered medications for this visit.    Allergies:   Cefoxitin    Social History:   reports that she has never smoked. She has never been exposed to tobacco smoke. She has never used smokeless tobacco. She reports that she does not drink alcohol and does not use drugs.   Family History:  family history includes Diabetes type II in her brother; Hypertension in her brother.    ROS:     Review of Systems  Constitutional: Negative.   HENT: Negative.    Eyes: Negative.   Respiratory: Negative.    Gastrointestinal: Negative.   Genitourinary: Negative.   Musculoskeletal: Negative.   Skin: Negative.   Neurological: Negative.   Endo/Heme/Allergies: Negative.   Psychiatric/Behavioral: Negative.  All other systems reviewed and are negative.     All other systems are reviewed and negative.    PHYSICAL EXAM: VS:  BP 116/72   Pulse 64   Ht 5' 5 (1.651 m)   Wt 155 lb 3.2 oz (70.4 kg)   SpO2 95%   BMI 25.83 kg/m  , BMI Body mass index is 25.83 kg/m. Last weight:  Wt Readings from Last 3 Encounters:  08/08/24 155 lb 3.2 oz (70.4 kg)  07/15/24 151 lb 12.8 oz (68.9 kg)  07/07/24 151 lb 12.8 oz (68.9 kg)     Physical Exam Constitutional:      Appearance: Normal appearance.  Cardiovascular:     Rate and Rhythm: Normal rate and regular rhythm.     Heart sounds: Normal heart sounds.  Pulmonary:     Effort: Pulmonary effort is normal.     Breath sounds: Normal breath sounds.   Musculoskeletal:     Right lower leg: No edema.     Left lower leg: No edema.  Neurological:     Mental Status: She is alert.       EKG:   Recent Labs: 07/23/2024: ALT 16; BUN 18; Creatinine, Ser 1.20; Potassium 4.7; Sodium 140    Lipid Panel    Component Value Date/Time   CHOL 168 06/23/2024 1006   TRIG 68 06/23/2024 1006   HDL 86 06/23/2024 1006   CHOLHDL 2.0 06/23/2024 1006   LDLCALC 69 06/23/2024 1006      Other studies Reviewed: Additional studies/ records that were reviewed today include:  Review of the above records demonstrates:       No data to display            ASSESSMENT AND PLAN:    ICD-10-CM   1. Edema, unspecified type  R60.9 torsemide  (DEMADEX ) 10 MG tablet   torsemide10 m m g PRN, and compression stockings.    2. Chronic diastolic heart failure (HCC)  P49.67 torsemide  (DEMADEX ) 10 MG tablet    3. Essential hypertension  I10 torsemide  (DEMADEX ) 10 MG tablet    dapagliflozin  propanediol (FARXIGA ) 10 MG TABS tablet    spironolactone  (ALDACTONE ) 25 MG tablet    4. Nonrheumatic mitral valve regurgitation  I34.0 torsemide  (DEMADEX ) 10 MG tablet    dapagliflozin  propanediol (FARXIGA ) 10 MG TABS tablet    5. SOB (shortness of breath)  R06.02 torsemide  (DEMADEX ) 10 MG tablet    dapagliflozin  propanediol (FARXIGA ) 10 MG TABS tablet    spironolactone  (ALDACTONE ) 25 MG tablet    6. Type 2 diabetes mellitus with stage 3a chronic kidney disease, with long-term current use of insulin  (HCC)  E11.22 torsemide  (DEMADEX ) 10 MG tablet   N18.31    Z79.4     7. Dyslipidemia  E78.5 dapagliflozin  propanediol (FARXIGA ) 10 MG TABS tablet    spironolactone  (ALDACTONE ) 25 MG tablet    8. Nonrheumatic mitral valve regurgitation  I34.0 torsemide  (DEMADEX ) 10 MG tablet    dapagliflozin  propanediol (FARXIGA ) 10 MG TABS tablet   trace    9. SOB (shortness of breath)  R06.02 torsemide  (DEMADEX ) 10 MG tablet    dapagliflozin  propanediol (FARXIGA ) 10 MG TABS tablet     spironolactone  (ALDACTONE ) 25 MG tablet   lot better after farxiga , aldactone     10. Acute diastolic CHF (congestive heart failure) (HCC)  I50.31 dapagliflozin  propanediol (FARXIGA ) 10 MG TABS tablet    spironolactone  (ALDACTONE ) 25 MG tablet   stress test no ischaemia    11. Acute diastolic CHF (congestive  heart failure) (HCC)  I50.31 dapagliflozin  propanediol (FARXIGA ) 10 MG TABS tablet    spironolactone  (ALDACTONE ) 25 MG tablet   Has SOB, edema of legs, orthopnea, and normal EF. Add farxiga /aldactone , and stop KCL. Also r/o CAD, stress test    12. Type 2 diabetes mellitus with stage 3b chronic kidney disease, with long-term current use of insulin  (HCC)  E11.22 dapagliflozin  propanediol (FARXIGA ) 10 MG TABS tablet   N18.32 spironolactone  (ALDACTONE ) 25 MG tablet   Z79.4     13. Essential hypertension  I10 torsemide  (DEMADEX ) 10 MG tablet    dapagliflozin  propanediol (FARXIGA ) 10 MG TABS tablet    spironolactone  (ALDACTONE ) 25 MG tablet   High, BP add aldactone  since creat normal. Stopp KCL       Problem List Items Addressed This Visit       Cardiovascular and Mediastinum   Essential hypertension   Relevant Medications   omega-3 acid ethyl esters (LOVAZA) 1 g capsule   torsemide  (DEMADEX ) 10 MG tablet   dapagliflozin  propanediol (FARXIGA ) 10 MG TABS tablet   spironolactone  (ALDACTONE ) 25 MG tablet     Endocrine   Type 2 diabetes mellitus with chronic kidney disease, with long-term current use of insulin  (HCC)   Relevant Medications   torsemide  (DEMADEX ) 10 MG tablet   dapagliflozin  propanediol (FARXIGA ) 10 MG TABS tablet   spironolactone  (ALDACTONE ) 25 MG tablet     Other   Dyslipidemia   Relevant Medications   omega-3 acid ethyl esters (LOVAZA) 1 g capsule   dapagliflozin  propanediol (FARXIGA ) 10 MG TABS tablet   spironolactone  (ALDACTONE ) 25 MG tablet   Other Visit Diagnoses       Edema, unspecified type    -  Primary   torsemide10 m m g PRN, and compression  stockings.   Relevant Medications   torsemide  (DEMADEX ) 10 MG tablet     Chronic diastolic heart failure (HCC)       Relevant Medications   omega-3 acid ethyl esters (LOVAZA) 1 g capsule   torsemide  (DEMADEX ) 10 MG tablet   spironolactone  (ALDACTONE ) 25 MG tablet     Nonrheumatic mitral valve regurgitation       Relevant Medications   omega-3 acid ethyl esters (LOVAZA) 1 g capsule   torsemide  (DEMADEX ) 10 MG tablet   dapagliflozin  propanediol (FARXIGA ) 10 MG TABS tablet   spironolactone  (ALDACTONE ) 25 MG tablet     SOB (shortness of breath)       Relevant Medications   torsemide  (DEMADEX ) 10 MG tablet   dapagliflozin  propanediol (FARXIGA ) 10 MG TABS tablet   spironolactone  (ALDACTONE ) 25 MG tablet     Nonrheumatic mitral valve regurgitation       trace   Relevant Medications   omega-3 acid ethyl esters (LOVAZA) 1 g capsule   torsemide  (DEMADEX ) 10 MG tablet   dapagliflozin  propanediol (FARXIGA ) 10 MG TABS tablet   spironolactone  (ALDACTONE ) 25 MG tablet     SOB (shortness of breath)       lot better after farxiga , aldactone    Relevant Medications   torsemide  (DEMADEX ) 10 MG tablet   dapagliflozin  propanediol (FARXIGA ) 10 MG TABS tablet   spironolactone  (ALDACTONE ) 25 MG tablet     Acute diastolic CHF (congestive heart failure) (HCC)       stress test no ischaemia   Relevant Medications   omega-3 acid ethyl esters (LOVAZA) 1 g capsule   torsemide  (DEMADEX ) 10 MG tablet   dapagliflozin  propanediol (FARXIGA ) 10 MG TABS tablet   spironolactone  (ALDACTONE ) 25 MG  tablet     Acute diastolic CHF (congestive heart failure) (HCC)       Has SOB, edema of legs, orthopnea, and normal EF. Add farxiga /aldactone , and stop KCL. Also r/o CAD, stress test   Relevant Medications   omega-3 acid ethyl esters (LOVAZA) 1 g capsule   torsemide  (DEMADEX ) 10 MG tablet   dapagliflozin  propanediol (FARXIGA ) 10 MG TABS tablet   spironolactone  (ALDACTONE ) 25 MG tablet          Disposition:    Return in about 3 months (around 11/05/2024).    Total time spent: 35 minutes  Signed,  Denyse Bathe, MD  08/08/2024 2:36 PM    [facility] "

## 2024-09-19 ENCOUNTER — Ambulatory Visit: Admitting: Cardiovascular Disease

## 2024-09-22 ENCOUNTER — Ambulatory Visit: Admitting: Internal Medicine
